# Patient Record
Sex: Female | Born: 1960 | Race: White | Hispanic: No | Marital: Married | Smoking: Former smoker
Health system: Southern US, Community
[De-identification: ages and names within clinical notes are randomized; demographics above are authoritative.]

## PROBLEM LIST (undated history)

## (undated) DIAGNOSIS — M2550 Pain in unspecified joint: Secondary | ICD-10-CM

## (undated) DIAGNOSIS — E349 Endocrine disorder, unspecified: Secondary | ICD-10-CM

## (undated) DIAGNOSIS — R7303 Prediabetes: Secondary | ICD-10-CM

## (undated) DIAGNOSIS — F32A Depression, unspecified: Secondary | ICD-10-CM

## (undated) DIAGNOSIS — IMO0001 Reserved for inherently not codable concepts without codable children: Secondary | ICD-10-CM

## (undated) DIAGNOSIS — M7662 Achilles tendinitis, left leg: Secondary | ICD-10-CM

## (undated) DIAGNOSIS — F419 Anxiety disorder, unspecified: Secondary | ICD-10-CM

## (undated) DIAGNOSIS — M549 Dorsalgia, unspecified: Secondary | ICD-10-CM

## (undated) DIAGNOSIS — E785 Hyperlipidemia, unspecified: Secondary | ICD-10-CM

## (undated) DIAGNOSIS — Z9889 Other specified postprocedural states: Secondary | ICD-10-CM

## (undated) DIAGNOSIS — R600 Localized edema: Secondary | ICD-10-CM

## (undated) DIAGNOSIS — N907 Vulvar cyst: Secondary | ICD-10-CM

## (undated) DIAGNOSIS — K219 Gastro-esophageal reflux disease without esophagitis: Secondary | ICD-10-CM

## (undated) DIAGNOSIS — R9389 Abnormal findings on diagnostic imaging of other specified body structures: Secondary | ICD-10-CM

## (undated) DIAGNOSIS — R002 Palpitations: Secondary | ICD-10-CM

## (undated) DIAGNOSIS — I10 Essential (primary) hypertension: Secondary | ICD-10-CM

## (undated) DIAGNOSIS — R112 Nausea with vomiting, unspecified: Secondary | ICD-10-CM

## (undated) DIAGNOSIS — M1990 Unspecified osteoarthritis, unspecified site: Secondary | ICD-10-CM

## (undated) DIAGNOSIS — J40 Bronchitis, not specified as acute or chronic: Secondary | ICD-10-CM

## (undated) DIAGNOSIS — Z9189 Other specified personal risk factors, not elsewhere classified: Secondary | ICD-10-CM

## (undated) DIAGNOSIS — Z973 Presence of spectacles and contact lenses: Secondary | ICD-10-CM

## (undated) DIAGNOSIS — M25473 Effusion, unspecified ankle: Secondary | ICD-10-CM

## (undated) DIAGNOSIS — F329 Major depressive disorder, single episode, unspecified: Secondary | ICD-10-CM

## (undated) DIAGNOSIS — N926 Irregular menstruation, unspecified: Secondary | ICD-10-CM

## (undated) DIAGNOSIS — D219 Benign neoplasm of connective and other soft tissue, unspecified: Secondary | ICD-10-CM

## (undated) DIAGNOSIS — M722 Plantar fascial fibromatosis: Secondary | ICD-10-CM

## (undated) DIAGNOSIS — N951 Menopausal and female climacteric states: Secondary | ICD-10-CM

## (undated) DIAGNOSIS — M654 Radial styloid tenosynovitis [de Quervain]: Secondary | ICD-10-CM

## (undated) DIAGNOSIS — M25562 Pain in left knee: Secondary | ICD-10-CM

## (undated) DIAGNOSIS — E119 Type 2 diabetes mellitus without complications: Secondary | ICD-10-CM

## (undated) DIAGNOSIS — E669 Obesity, unspecified: Secondary | ICD-10-CM

## (undated) HISTORY — DX: Anxiety disorder, unspecified: F41.9

## (undated) HISTORY — DX: Menopausal and female climacteric states: N95.1

## (undated) HISTORY — DX: Plantar fascial fibromatosis: M72.2

## (undated) HISTORY — PX: SKIN CANCER EXCISION: SHX779

## (undated) HISTORY — DX: Major depressive disorder, single episode, unspecified: F32.9

## (undated) HISTORY — DX: Pain in unspecified joint: M25.50

## (undated) HISTORY — DX: Palpitations: R00.2

## (undated) HISTORY — PX: DILATION AND CURETTAGE OF UTERUS: SHX78

## (undated) HISTORY — PX: HYSTEROSCOPY: SHX211

## (undated) HISTORY — DX: Endocrine disorder, unspecified: E34.9

## (undated) HISTORY — DX: Hyperlipidemia, unspecified: E78.5

## (undated) HISTORY — PX: VIDEO ASSISTED THORACOSCOPY (VATS)/THOROCOTOMY: SHX6173

## (undated) HISTORY — DX: Achilles tendinitis, left leg: M76.62

## (undated) HISTORY — PX: LASER ABLATION OF THE CERVIX: SHX1949

## (undated) HISTORY — DX: Localized edema: R60.0

## (undated) HISTORY — DX: Obesity, unspecified: E66.9

## (undated) HISTORY — DX: Dorsalgia, unspecified: M54.9

## (undated) HISTORY — DX: Essential (primary) hypertension: I10

## (undated) HISTORY — DX: Depression, unspecified: F32.A

## (undated) HISTORY — DX: Pain in left knee: M25.562

## (undated) HISTORY — DX: Benign neoplasm of connective and other soft tissue, unspecified: D21.9

---

## 1990-12-31 HISTORY — PX: LASER ABLATION OF THE CERVIX: SHX1949

## 1998-08-22 ENCOUNTER — Ambulatory Visit (HOSPITAL_COMMUNITY): Admission: RE | Admit: 1998-08-22 | Discharge: 1998-08-22 | Payer: Self-pay | Admitting: Obstetrics and Gynecology

## 1998-10-08 ENCOUNTER — Emergency Department (HOSPITAL_COMMUNITY): Admission: EM | Admit: 1998-10-08 | Discharge: 1998-10-08 | Payer: Self-pay | Admitting: Emergency Medicine

## 1999-04-10 ENCOUNTER — Other Ambulatory Visit: Admission: RE | Admit: 1999-04-10 | Discharge: 1999-04-10 | Payer: Self-pay | Admitting: Obstetrics & Gynecology

## 2000-04-24 ENCOUNTER — Other Ambulatory Visit: Admission: RE | Admit: 2000-04-24 | Discharge: 2000-04-24 | Payer: Self-pay | Admitting: Obstetrics and Gynecology

## 2001-04-25 ENCOUNTER — Other Ambulatory Visit: Admission: RE | Admit: 2001-04-25 | Discharge: 2001-04-25 | Payer: Self-pay | Admitting: *Deleted

## 2002-04-27 ENCOUNTER — Other Ambulatory Visit: Admission: RE | Admit: 2002-04-27 | Discharge: 2002-04-27 | Payer: Self-pay | Admitting: Obstetrics and Gynecology

## 2003-04-30 ENCOUNTER — Other Ambulatory Visit: Admission: RE | Admit: 2003-04-30 | Discharge: 2003-04-30 | Payer: Self-pay | Admitting: Obstetrics and Gynecology

## 2003-10-21 ENCOUNTER — Encounter: Payer: Self-pay | Admitting: Obstetrics and Gynecology

## 2003-10-21 ENCOUNTER — Encounter: Admission: RE | Admit: 2003-10-21 | Discharge: 2003-10-21 | Payer: Self-pay | Admitting: Obstetrics and Gynecology

## 2004-06-02 ENCOUNTER — Other Ambulatory Visit: Admission: RE | Admit: 2004-06-02 | Discharge: 2004-06-02 | Payer: Self-pay | Admitting: Obstetrics and Gynecology

## 2004-11-09 ENCOUNTER — Encounter: Admission: RE | Admit: 2004-11-09 | Discharge: 2004-11-09 | Payer: Self-pay | Admitting: Obstetrics and Gynecology

## 2004-11-20 ENCOUNTER — Encounter: Admission: RE | Admit: 2004-11-20 | Discharge: 2004-11-20 | Payer: Self-pay | Admitting: Obstetrics and Gynecology

## 2005-02-05 ENCOUNTER — Ambulatory Visit: Payer: Self-pay | Admitting: Family Medicine

## 2005-10-15 ENCOUNTER — Ambulatory Visit: Payer: Self-pay | Admitting: Family Medicine

## 2005-11-05 ENCOUNTER — Ambulatory Visit: Payer: Self-pay | Admitting: Internal Medicine

## 2005-11-26 ENCOUNTER — Ambulatory Visit: Payer: Self-pay | Admitting: Internal Medicine

## 2006-06-24 ENCOUNTER — Ambulatory Visit: Payer: Self-pay

## 2006-06-24 ENCOUNTER — Ambulatory Visit: Payer: Self-pay | Admitting: Internal Medicine

## 2006-11-04 ENCOUNTER — Encounter: Admission: RE | Admit: 2006-11-04 | Discharge: 2006-11-04 | Payer: Self-pay | Admitting: Obstetrics and Gynecology

## 2006-11-11 ENCOUNTER — Ambulatory Visit: Payer: Self-pay | Admitting: Internal Medicine

## 2007-03-07 ENCOUNTER — Ambulatory Visit: Payer: Self-pay | Admitting: Internal Medicine

## 2007-03-07 DIAGNOSIS — I10 Essential (primary) hypertension: Secondary | ICD-10-CM | POA: Insufficient documentation

## 2007-03-07 DIAGNOSIS — F329 Major depressive disorder, single episode, unspecified: Secondary | ICD-10-CM

## 2007-03-07 DIAGNOSIS — F419 Anxiety disorder, unspecified: Secondary | ICD-10-CM

## 2007-03-07 DIAGNOSIS — J309 Allergic rhinitis, unspecified: Secondary | ICD-10-CM

## 2007-03-07 LAB — CONVERTED CEMR LAB
BUN: 17 mg/dL (ref 6–23)
Basophils Absolute: 0.1 10*3/uL (ref 0.0–0.1)
CO2: 27 meq/L (ref 19–32)
Calcium: 9.1 mg/dL (ref 8.4–10.5)
Chloride: 104 meq/L (ref 96–112)
Cholesterol: 213 mg/dL (ref 0–200)
Creatinine, Ser: 0.5 mg/dL (ref 0.4–1.2)
GFR calc Af Amer: 172 mL/min
Glucose, Bld: 95 mg/dL (ref 70–99)
HCT: 39.9 % (ref 36.0–46.0)
HDL: 44.9 mg/dL (ref >39.0)
Hemoglobin: 13.6 g/dL (ref 12.0–15.0)
Lymphocytes Relative: 28.9 % (ref 12.0–46.0)
Monocytes Absolute: 0.5 10*3/uL (ref 0.2–0.7)
Platelets: 384 10*3/uL (ref 150–400)
Potassium: 3.8 meq/L (ref 3.5–5.1)
RBC: 4.12 M/uL (ref 3.87–5.11)
Sodium: 138 meq/L (ref 135–145)
WBC: 6 10*3/uL (ref 4.5–10.5)

## 2007-03-17 ENCOUNTER — Ambulatory Visit: Payer: Self-pay | Admitting: Internal Medicine

## 2007-11-28 ENCOUNTER — Encounter: Admission: RE | Admit: 2007-11-28 | Discharge: 2007-11-28 | Payer: Self-pay | Admitting: Obstetrics and Gynecology

## 2007-12-12 ENCOUNTER — Ambulatory Visit: Payer: Self-pay | Admitting: Internal Medicine

## 2008-01-30 ENCOUNTER — Ambulatory Visit: Payer: Self-pay | Admitting: *Deleted

## 2008-02-06 ENCOUNTER — Encounter: Payer: Self-pay | Admitting: Internal Medicine

## 2008-02-06 ENCOUNTER — Ambulatory Visit: Payer: Self-pay | Admitting: *Deleted

## 2008-02-13 ENCOUNTER — Ambulatory Visit: Payer: Self-pay | Admitting: *Deleted

## 2008-02-27 ENCOUNTER — Ambulatory Visit: Payer: Self-pay | Admitting: *Deleted

## 2008-03-05 ENCOUNTER — Telehealth (INDEPENDENT_AMBULATORY_CARE_PROVIDER_SITE_OTHER): Payer: Self-pay | Admitting: *Deleted

## 2008-03-12 ENCOUNTER — Ambulatory Visit: Payer: Self-pay | Admitting: *Deleted

## 2008-03-26 ENCOUNTER — Ambulatory Visit: Payer: Self-pay | Admitting: *Deleted

## 2008-04-23 ENCOUNTER — Telehealth (INDEPENDENT_AMBULATORY_CARE_PROVIDER_SITE_OTHER): Payer: Self-pay | Admitting: *Deleted

## 2008-04-23 ENCOUNTER — Ambulatory Visit: Payer: Self-pay | Admitting: *Deleted

## 2008-04-30 ENCOUNTER — Ambulatory Visit: Payer: Self-pay | Admitting: *Deleted

## 2008-06-22 ENCOUNTER — Telehealth (INDEPENDENT_AMBULATORY_CARE_PROVIDER_SITE_OTHER): Payer: Self-pay | Admitting: *Deleted

## 2008-06-22 ENCOUNTER — Ambulatory Visit: Payer: Self-pay | Admitting: Endocrinology

## 2008-06-22 DIAGNOSIS — J45909 Unspecified asthma, uncomplicated: Secondary | ICD-10-CM

## 2008-06-23 ENCOUNTER — Telehealth: Payer: Self-pay | Admitting: Endocrinology

## 2008-06-25 ENCOUNTER — Ambulatory Visit: Payer: Self-pay | Admitting: Cardiology

## 2008-06-28 ENCOUNTER — Encounter: Payer: Self-pay | Admitting: Endocrinology

## 2008-07-01 ENCOUNTER — Encounter: Payer: Self-pay | Admitting: Endocrinology

## 2008-07-01 ENCOUNTER — Ambulatory Visit: Payer: Self-pay | Admitting: Cardiothoracic Surgery

## 2008-07-06 ENCOUNTER — Ambulatory Visit (HOSPITAL_COMMUNITY): Admission: RE | Admit: 2008-07-06 | Discharge: 2008-07-06 | Payer: Self-pay | Admitting: Cardiothoracic Surgery

## 2008-07-08 ENCOUNTER — Ambulatory Visit: Payer: Self-pay | Admitting: Cardiothoracic Surgery

## 2008-07-13 ENCOUNTER — Inpatient Hospital Stay (HOSPITAL_COMMUNITY): Admission: RE | Admit: 2008-07-13 | Discharge: 2008-07-16 | Payer: Self-pay | Admitting: Cardiothoracic Surgery

## 2008-07-13 ENCOUNTER — Encounter: Payer: Self-pay | Admitting: Cardiothoracic Surgery

## 2008-07-15 ENCOUNTER — Ambulatory Visit: Payer: Self-pay | Admitting: Cardiothoracic Surgery

## 2008-07-23 ENCOUNTER — Ambulatory Visit: Payer: Self-pay | Admitting: Cardiothoracic Surgery

## 2008-07-30 ENCOUNTER — Encounter (INDEPENDENT_AMBULATORY_CARE_PROVIDER_SITE_OTHER): Payer: Self-pay | Admitting: *Deleted

## 2008-08-09 ENCOUNTER — Encounter: Admission: RE | Admit: 2008-08-09 | Discharge: 2008-08-09 | Payer: Self-pay | Admitting: Cardiothoracic Surgery

## 2008-08-12 ENCOUNTER — Ambulatory Visit: Payer: Self-pay | Admitting: Cardiothoracic Surgery

## 2008-11-11 ENCOUNTER — Ambulatory Visit: Payer: Self-pay | Admitting: Cardiothoracic Surgery

## 2008-11-11 ENCOUNTER — Encounter: Admission: RE | Admit: 2008-11-11 | Discharge: 2008-11-11 | Payer: Self-pay | Admitting: Cardiothoracic Surgery

## 2008-12-13 ENCOUNTER — Encounter: Admission: RE | Admit: 2008-12-13 | Discharge: 2008-12-13 | Payer: Self-pay | Admitting: Obstetrics and Gynecology

## 2008-12-21 ENCOUNTER — Ambulatory Visit: Payer: Self-pay | Admitting: Internal Medicine

## 2008-12-27 LAB — CONVERTED CEMR LAB
BUN: 19 mg/dL (ref 6–23)
Calcium: 9.3 mg/dL (ref 8.4–10.5)
Creatinine, Ser: 0.6 mg/dL (ref 0.4–1.2)
GFR calc Af Amer: 139 mL/min
GFR calc non Af Amer: 115 mL/min
Sodium: 140 meq/L (ref 135–145)

## 2009-05-12 ENCOUNTER — Ambulatory Visit: Payer: Self-pay | Admitting: Cardiothoracic Surgery

## 2009-05-12 ENCOUNTER — Encounter: Admission: RE | Admit: 2009-05-12 | Discharge: 2009-05-12 | Payer: Self-pay | Admitting: Cardiothoracic Surgery

## 2009-05-12 ENCOUNTER — Encounter: Payer: Self-pay | Admitting: Internal Medicine

## 2009-05-31 ENCOUNTER — Telehealth (INDEPENDENT_AMBULATORY_CARE_PROVIDER_SITE_OTHER): Payer: Self-pay | Admitting: *Deleted

## 2009-06-22 ENCOUNTER — Telehealth (INDEPENDENT_AMBULATORY_CARE_PROVIDER_SITE_OTHER): Payer: Self-pay | Admitting: *Deleted

## 2009-08-29 ENCOUNTER — Ambulatory Visit: Payer: Self-pay | Admitting: Internal Medicine

## 2009-08-29 DIAGNOSIS — Z6841 Body Mass Index (BMI) 40.0 and over, adult: Secondary | ICD-10-CM | POA: Insufficient documentation

## 2009-08-31 LAB — CONVERTED CEMR LAB
Basophils Absolute: 0 10*3/uL (ref 0.0–0.1)
Eosinophils Absolute: 0.2 10*3/uL (ref 0.0–0.7)
GFR calc non Af Amer: 140.76 mL/min (ref >60)
HCT: 38.5 % (ref 36.0–46.0)
Hemoglobin: 13.3 g/dL (ref 12.0–15.0)
Lymphocytes Relative: 23.7 % (ref 12.0–46.0)
Lymphs Abs: 1.6 10*3/uL (ref 0.7–4.0)
MCV: 95.9 fL (ref 78.0–100.0)
Monocytes Absolute: 0.7 10*3/uL (ref 0.1–1.0)
Monocytes Relative: 10.9 % (ref 3.0–12.0)
Neutro Abs: 4.2 10*3/uL (ref 1.4–7.7)
Neutrophils Relative %: 62.1 % (ref 43.0–77.0)
Platelets: 305 10*3/uL (ref 150.0–400.0)
Potassium: 3.9 meq/L (ref 3.5–5.1)
RBC: 4.02 M/uL (ref 3.87–5.11)
RDW: 12.2 % (ref 11.5–14.6)

## 2009-09-06 ENCOUNTER — Telehealth (INDEPENDENT_AMBULATORY_CARE_PROVIDER_SITE_OTHER): Payer: Self-pay | Admitting: *Deleted

## 2010-01-02 ENCOUNTER — Encounter: Admission: RE | Admit: 2010-01-02 | Discharge: 2010-01-02 | Payer: Self-pay | Admitting: Obstetrics and Gynecology

## 2011-01-20 ENCOUNTER — Encounter: Payer: Self-pay | Admitting: Obstetrics and Gynecology

## 2011-01-22 ENCOUNTER — Encounter: Payer: Self-pay | Admitting: Cardiothoracic Surgery

## 2011-01-30 NOTE — Consult Note (Signed)
Summary: Behavioral Medicine  Behavioral Medicine   Imported By: Freddy Jaksch 02/11/2008 10:45:28  _____________________________________________________________________  External Attachment:    Type:   Image     Comment:   External Document

## 2011-01-31 ENCOUNTER — Encounter: Payer: Self-pay | Admitting: Internal Medicine

## 2011-01-31 ENCOUNTER — Ambulatory Visit (INDEPENDENT_AMBULATORY_CARE_PROVIDER_SITE_OTHER): Payer: BC Managed Care – PPO | Admitting: Internal Medicine

## 2011-01-31 DIAGNOSIS — E669 Obesity, unspecified: Secondary | ICD-10-CM

## 2011-01-31 DIAGNOSIS — R61 Generalized hyperhidrosis: Secondary | ICD-10-CM | POA: Insufficient documentation

## 2011-01-31 DIAGNOSIS — F411 Generalized anxiety disorder: Secondary | ICD-10-CM

## 2011-01-31 DIAGNOSIS — I10 Essential (primary) hypertension: Secondary | ICD-10-CM

## 2011-02-05 ENCOUNTER — Encounter: Payer: Self-pay | Admitting: Internal Medicine

## 2011-02-05 ENCOUNTER — Ambulatory Visit (INDEPENDENT_AMBULATORY_CARE_PROVIDER_SITE_OTHER)
Admission: RE | Admit: 2011-02-05 | Discharge: 2011-02-05 | Disposition: A | Discharge status: Home / Self Care | Payer: BC Managed Care – PPO | Source: Ambulatory Visit | Attending: Internal Medicine | Admitting: Internal Medicine

## 2011-02-05 ENCOUNTER — Other Ambulatory Visit: Payer: Self-pay | Admitting: Internal Medicine

## 2011-02-05 DIAGNOSIS — R61 Generalized hyperhidrosis: Secondary | ICD-10-CM

## 2011-02-07 NOTE — Assessment & Plan Note (Signed)
Summary: f/u   Vital Signs:  Patient profile:   50 year old female Height:      64 inches Weight:      293.38 pounds BMI:     50.54 Pulse rate:   74 / minute Pulse rhythm:   regular BP sitting:   132 / 88  (left arm) Cuff size:   large  Vitals Entered By: Army Fossa CMA (January 31, 2011 9:37 AM) CC: Follow up visit from Meds. fasting Comments Having night sweats  feels run down [facility]   History of Present Illness: routine office visit, has several issues ---Her main concern is her weight, she has gained several pounds ---Also having sweats at night, no actual fevers; the patient wonders if symptoms are related to menopause . States she has regular periods. ---Anxiety and depression well controlled, feels upbeat and well. the last time she was here, we talked about decrease her sertraline to 75 mg daily but she did not.    ROS  As far as her diet, she admits to poor habits although she has cut down on  salt intake and sodas lately. she had feet  pain, so her podiatrist, symptoms better Admits to occasional cough with clear sputum. No hemoptysis Occasional shortness of breath as well, she thinks related to her asthma. No chest pain,  no lower extremity edema  Current Medications (verified): 1)  Zoloft 50 Mg  Tabs (Sertraline Hcl) .... 2  By Mouth Once Daily 2)  Hydrochlorothiazide 25 Mg  Tabs (Hydrochlorothiazide) .Marland Kitchen.. 1 By Mouth Qd 3)  Diovan 160 Mg  Tabs (Valsartan) .Marland Kitchen.. 1 By Mouth Once Daily. Must Make An Appointment Before Additional Refills. 4)  Flonase 50 Mcg/act Susp (Fluticasone Propionate) .... 2 Sprays On Each Side of The Nose  Once Daily 5)  Ventolin Hfa 108 (90 Base) Mcg/act Aers (Albuterol Sulfate) .... 2 Puffs Qid As Needed Wheezing  Allergies (verified): 1)  ! Sulfa  Past History:  Past Medical History: Reviewed history from 12/21/2008 and no changes required. 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 Cervical surgery (gyn)  Social History: works at the OR (Advertising account executive, [employer]) Married no kids tobacco--no ETOH-- rarely  Physical Exam  General:  alert, well-developed, and overweight-appearing.   Lungs:  normal respiratory effort, no intercostal retractions, no accessory muscle use, and normal breath sounds.   Heart:  normal rate, regular rhythm, and no murmur.   Extremities:  no LE edema Neurologic:  alert & oriented X3, strength normal in all extremities, and gait normal.   Psych:  Oriented X3, memory intact for recent and remote, normally interactive, good eye contact, not anxious appearing, and not depressed appearing.     Impression & Recommendations:  Problem # 1:  EXOGENOUS OBESITY (ICD-278.00) at this point, her weight is her main medical problem Recommend daily walks and died. She joined [employer] last year, apparently not working for her. We discussed a nutritionist referral versus [employer].  Orders: TLB-TSH (Thyroid Stimulating Hormone) (84443-TSH)  Problem # 2:  HYPERTENSION (ICD-401.9) no change for now Her updated medication list for this problem includes:    Hydrochlorothiazide 25 Mg Tabs (Hydrochlorothiazide) .Marland Kitchen... 1 by mouth qd    Diovan 160 Mg Tabs (Valsartan) .Marland Kitchen... 1 by mouth once daily.  BP today: 132/88 Prior  BP: 128/88 (08/29/2009)  Labs Reviewed: K+: 3.9 (08/29/2009) Creat: : 0.5 (08/29/2009)   Chol: 213 (03/07/2007)   HDL: 44.9 (03/07/2007)   LDL: DEL (03/07/2007)   TG: 102 (03/07/2007)  Problem # 3:  ANXIETY (ICD-300.00) see previous entries, since her last visit she decided not to decrease Zoloft to 75 mg but today she states she is ready. see instructions Her updated medication list for this problem includes:     Zoloft 50 Mg Tabs (Sertraline hcl) .Marland Kitchen... 1.5 tablets daily  Problem # 4:  NIGHT SWEATS (ICD-780.8)  reports night sweats, no fever, no weight loss. 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 1.5 tablets daily 2)  Hydrochlorothiazide 25 Mg Tabs (Hydrochlorothiazide) .Marland Kitchen.. 1 by mouth qd 3)  Diovan 160 Mg Tabs (Valsartan) .Marland Kitchen.. 1 by mouth once daily. 4)  Flonase 50 Mcg/act Susp (Fluticasone propionate) .... 2 sprays on each side of the nose  once daily 5)  Ventolin Hfa 108 (90 Base) Mcg/act Aers (Albuterol sulfate) .... 2 puffs qid as needed wheezing  Patient Instructions: 1)  decrease sertraline to 75 mg daily (1.5 tablets daily) 2)  walk daily for at least 30 minutes 3)  Please schedule a follow-up appointment in 3 months .    Orders Added: 1)  Venipuncture [36415] 2)  TLB-BMP (Basic Metabolic Panel-BMET) [80048-METABOL] 3)  TLB-CBC Platelet - w/Differential [85025-CBCD] 4)  TLB-Lipid Panel [80061-LIPID] 5)  T-2 View CXR [71020TC] 6)  TLB-TSH (Thyroid Stimulating Hormone) [84443-TSH] 7)  TLB-Hepatic/Liver Function Pnl [80076-HEPATIC] 8)  Est. Patient Level IV [98119]

## 2011-05-15 NOTE — Discharge Summary (Signed)
Rachel Norris, Rachel Norris           ACCOUNT NO.:  [id number]   MEDICAL RECORD NO.:  [id number]          PATIENT TYPE:  INP   LOCATION:  3301                         FACILITY:  [facility]   PHYSICIAN:  Sheliah Plane, MD    DATE OF BIRTH:  Dec 12, 1961   DATE OF ADMISSION:  07/13/2008  DATE OF DISCHARGE:  07/16/2008                               DISCHARGE SUMMARY   FINAL DIAGNOSES:  1. Right upper lobe mass.  2. necrotizing granuloma.  No malignancy identified.   SECONDARY DIAGNOSES:  1. History of asthma.  2. History of arthritis, especially involving the feet.  3. Hypertension.  4. Untreated hyperlipidemia.  5. Smoker.   IN-HOSPITAL OPERATIONS AND PROCEDURES:  Right video-assisted  thoracoscopic surgery with wedge resection of right upper lobe mass.   THE PATIENT'S HISTORY AND PHYSICAL AND HOSPITAL COURSE:  The patient is  a 50 year old female with no previous history of chest x-rays who  presents with a 2-week hacking cough, which increased with wheezing and  nasal drainage.  No fever, chills, or hemoptysis noted.  The patient  presented to Dr. George Hugh office who examined her and started her on  Cefotan and also obtained chest x-ray, which showed slightly lobulated  22 x 17-mm right lung mass.  CT scan was recommended and performed on  June 27, 2008.  Because of the right lung mass, the patient is referred  to thoracic surgery for consultation.  The patient was a remote smoker,  having quit 11 years ago.  Previously, she had 16-18-year history of  smoking, half to one pack cigarettes a day.  She has a history of asthma  for many years but no specific treatment.  She was seen and evaluated by  Dr. Tyrone Sage.  Dr. Tyrone Sage discussed with the patient regarding wedge  resection of this mass.  He discussed risks and benefits with the  patient.  The patient also understanding and agreed to proceed.  Surgery  was scheduled for July 13, 2008.  For details of the patient's past  medical  history and physical exam, please see dictated H&P.   The patient was taken to the operating room on July 13, 2008, where she  underwent right video-assisted thoracoscopic surgery with right  thoracotomy, wedge resection of right upper lobe mass.  Pathology report  came back showing necrotizing granulomas with no malignancy identified.  The patient tolerated this procedure well and was transferred to the  intensive care unit in stable condition.  Postoperatively, the patient  was able to be extubated without difficulty.  Post extubation, she was  noted to be alert and oriented x4.  Neuro intact.  Vital signs  postoperatively were stable.  She was afebrile.  Her rate and blood  pressure is stable.  She was restarted on her Diovan as well as HCTZ.  She remained in normal sinus rhythm.  She was able to be weaned off  oxygen with sats greater than 90% on room air.  Daily chest x-rays  obtained postoperatively.  These remained stable.  No pneumothorax  noted.  The patient had no air leak noted from chest tube.  Chest tube  was placed to water seal by postop day #2 and was able to be  discontinued postop day #3 based on chest x-ray remaining stable and no  air leak.  A followup chest x-ray is currently pending.  The remainder  of the patient's postoperative course was unremarkable.  She remained  hemodynamically stable.  She was out of bed and ambulating well without  difficulty.  She was tolerating diet well.  No nausea or vomiting noted.  All incisions are clean, dry, and intact and healing well.   The patient is tentatively ready for discharge home today, July 16, 2008, on postop day #3, pending PA and lateral chest x-ray stable  following removal of chest tube.   Labs done on July 15, 2008, showed a sodium of 136, potassium 3.9,  chloride of 98, bicarb of 29, BUN 12, creatinine 0.6, and glucose 124.  White blood cell count was 10.5, hemoglobin of 11.7, hematocrit 33.8,  and platelet  count 334.  The AST of 23, ALT 22, alk phos was 54 with a  total bilirubin of 0.7, and total protein 6.3.   FOLLOW UP APPOINTMENTS:  Followup appointment has arranged with Dr.  Tyrone Sage for August 12, 2008, at 1:00 p.m..  The patient will need to  obtain PA and lateral chest x-ray 30 minutes prior to this appointment.  An appointment with a nurse has been arranged for suture removal for  July 23, 2008, at 10:30 a.m.Marland Kitchen   ACTIVITY:  The patient is instructed no driving until released to do so  and no lifting over 10 pounds.  She is told to ambulate 3-4 times per  day.  Progress as tolerated and continue using her breathing exercises.   INCISIONAL CARE:  The patient is told to shower and washing her  incisions using soap and water.  She is to contact the office if she  develops any drainage or opening from any part of her incision sites.   DIET:  The patient is to begin on diet to be low-fat, low-salt.   DISCHARGE MEDICATIONS:  1. Diovan 160 mg daily.  2. Zoloft 100 mg daily.  3. HCTZ 25 mg daily.  4. Advair p.r.n.  5. Pepcid p.r.n.  6. Percocet 5/325 one to two tablets q. 4-6 hours p.r.n.      Theda Belfast, [place]      Sheliah Plane, MD  Electronically Signed    KMD/MEDQ  D:  07/16/2008  T:  07/17/2008  Job:  [id number]   cc:   Gregary Signs A. Everardo All, MD  Eliberto Ivory Rosalio Macadamia, M.D.

## 2011-05-15 NOTE — Assessment & Plan Note (Signed)
OFFICE VISIT   Rachel, Norris  DOB:  1961/09/16                                        May 12, 2009  CHART #:  [id number]   The patient returns to the office today for followup chest x-ray.  In  July 2009, she underwent right mini thoracotomy and wedge resection of  right lung lesion which was found to be a 2.5 x 1.5 cm mass in the right  upper lobe.  On final PET, there was a necrotizing granuloma.  She has  done well postoperatively and denies any fever or chills.  She does  occasionally have some wheezing.  No hemoptysis.  She has been a  nonsmoker for the past 13 years.   PHYSICAL EXAMINATION:  VITAL SIGNS:  Her blood pressure 138/86, pulse  62, respiratory rate is 18, and O2 sats 96%.  LUNGS:  Clear bilaterally.  CHEST:  The right chest incision is well healed.  EXTREMITIES:  She has no leg tenderness or swelling.   Followup chest x-ray shows no evidence of development of nodules.  There  is postoperative changes present, otherwise clear lung fields.   IMPRESSION:  The patient is stable following right thoracotomy with no  evidence of new lesions now 8 months postop.  At this point with benign  diagnosis and no changes in chest x-ray, we will let Dr. Drue Novel continue to  follow her.  I have not made her a return appointment to see me, but  would be glad to see her at his request at anytime.   Sheliah Plane, MD  Electronically Signed   EG/MEDQ  D:  05/12/2009  T:  05/13/2009  Job:  [id number]   cc:   Willow Ora, MD

## 2011-05-15 NOTE — Assessment & Plan Note (Signed)
OFFICE VISIT   Rachel Norris, Rachel Norris  DOB:  08-26-1961                                        August 12, 2008  CHART #:  [id number]   The patient returns to the office today.  She was originally seen as a  consult on July 01, 2008, with a slightly lobulated 22 x 17-mm right lung  mass found on CT scan without hilar adenopathy.  The patient did have a  previous history of smoking.  Subsequently, pulmonary function tests  were performed and PET scan was done.  The lesion on PET scan had low-  level activity at approximately 1.4 g/mL, suggesting that it was either  benign or an endoneoplasm such as bronchioalveolar carcinoma.  After  discussing this with the patient, it was still decided to proceed with  right video-assisted thoracoscopy and wedge resection of the lesion for  both the pathologic diagnosis and removal of the lesion rather than  proceeding at age 40 with numerous sequential CT scans.  The patient was  agreeable with this approach, and we will tentatively plan surgery on  July 14.   Sheliah Plane, MD  Electronically Signed   EG/MEDQ  D:  08/12/2008  T:  08/13/2008  Job:  [id number]   cc:   Gregary Signs A. Everardo All, MD

## 2011-05-15 NOTE — Op Note (Signed)
Rachel Norris, Rachel Norris                 ACCOUNT NO.:  [id number]   MEDICAL RECORD NO.:  [id number]          PATIENT TYPE:  INP   LOCATION:                                FACILITY:  [facility]   PHYSICIAN:  Sheliah Plane, MD    DATE OF BIRTH:  08-Aug-1961   DATE OF PROCEDURE:  07/13/2008  DATE OF DISCHARGE:  07/16/2008                               OPERATIVE REPORT   PREOPERATIVE DIAGNOSIS:  Right lung lesion.   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   PROCEDURE PERFORMED:  1. Bronchoscopy.  2. Right mini thoracotomy with right video-assisted thoracoscopy.  3. Right mini thoracotomy with wedge resection of right upper lobe      lung lesion.   SURGEON:  Sheliah Plane, MD   FIRST ASSISTANT:  Stephanie Acre. Dominick, PA   BRIEF HISTORY:  The patient is a 50 year old female with a previous  smoking history who presents with incidentally-obtained chest x-ray with  a 2.5- x 1.5-cm mass in the right upper lobe suspicious for carcinoma.  Preoperatively, the patient obtained pulmonary function studies, MRI of  the head to rule out metastatic disease, and a PET scan.  On the PET  scan, the SUV was not elevated.  There was no elevated activity in the  mediastinal lymph nodes.  Because of the size of the lesion, a surgical  resection was recommended.  The patient agreed and signed informed  consent.   DESCRIPTION OF THE PROCEDURE:  The patient underwent general  endotracheal anesthesia without incident through the double-lumen  endotracheal tube.  A fiberoptic bronchoscope was passed to the  subsegmental level bilaterally with no endobronchial lesions and good  position of the double-lumen endotracheal tube.  The patient was then  turned to the lateral decubitus position after confirming side and  identity.  Right chest was prepped with Betadine and draped in the usual  sterile manner.  Because of the patient's large size, positioning was  somewhat  difficult and also a single thoracoscopy port was placed in the  midaxillary line.  Even this was difficult because of the depth of the  patient's chest wall.  The scope was used to explore the chest; however,  the exact location of the lesion which was not on the pleural surface  was difficult to locate.  A small thoracotomy incision was made and the  lesion was palpated in the right upper lobe.  A Echelon stapler was then  used to perform a wedge resection of the lesion.  During the process of  doing this, yellow material of toothpaste consistency leaked from the  area.  Cultures of this were obtained including fungal and AFB.  Frozen  section revealed no evidence of malignancy in the wall of what appeared  to be a cystic structure.  The scopes were removed.  The cut edge of the  lung was tested for leaks.  A single chest tube was left in place.  Two  intercostal sutures were placed.  The chest wall chest was closed in  layers of interrupted 0 Vicryl, 3-0  Vicryl for subcutaneous tissue, and  a 3-0 subcuticular stitch.  Dry dressings were  applied.  Sponge and needle count was reported as correct at the  completion of the procedure, and the patient tolerated the procedure  without obvious complication and was transferred to Surgical Intensive  Care Unit for further postoperative care.      Sheliah Plane, MD  Electronically Signed     EG/MEDQ  D:  07/17/2008  T:  07/17/2008  Job:  [id number]   cc:   Gregary Signs A. Everardo All, MD

## 2011-05-15 NOTE — Assessment & Plan Note (Signed)
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  The patient is making a  good recovery.  She has started at a new job and really has no  limitations in her ability now.  Occasionally, very mild discomfort in  the right chest when she sneezes, but otherwise, no complaints.  Unfortunately on exam, her blood pressure remains elevated, today it is  133/90.  On previous office visits, her diastolic blood pressures have  been 97, 97, and 92.  Her lungs are clear bilaterally.  Her incision on  the right chest is completely healed without evidence of infection.  Lower extremities are without tenderness or edema.   Followup chest x-ray shows clear lung fields.  No new lesions.   PLAN:  I will plan to see her back in 6 months with a followup chest x-  ray and if there are no additional changes, we will discharge her from  regular visits.  I have encouraged her to work on losing weight and also  to return to see Dr. Drue Novel at [facility] for follow up on her blood pressure  control.   Rachel Plane, MD  Electronically Signed   EG/MEDQ  D:  11/11/2008  T:  11/11/2008  Job:  [id number]   cc:   Rachel Ora, MD

## 2011-05-15 NOTE — Assessment & Plan Note (Signed)
OFFICE VISIT   Rachel Norris, Rachel Norris  DOB:  [birth year]                                        August 12, 2008  CHART #:  [id number]   The patient returns to the office today in followup after the mini right  thoracotomy with wedge resection of right upper lobe lung lesion done on  July 14.  Since discharge, the patient has done well.  She has had some  right chest wall pain, but it is improved.  Chest x-ray was done 2 days  ago.  She is increased in her physical activity and is anxious to return  to a new job in several weeks.   On exam today, her blood pressure is elevated at 150/92, pulse is 78,  respiratory rate is 18, and O2 sats 94%.  Her right chest incision is  well healed.  The lungs are clear bilaterally.  She has no pedal edema.  Overall, looks well.   Followup chest x-ray shows postoperative changes, but without  pneumothorax or significant pleural effusions.  I reviewed the final  path with the patient, which shows necrotizing granuloma.  No  malignancies were identified.  Special stains for organisms were  performed.  No evidence of acid-fast bacilli were identified, and no  fungi were identified.  Overall, I am pleased with the patient's  progress.  I do plan to see her back in 3 months with a followup chest x-  ray.  The patient is followed by Dr. Romero Belling, and I will send a  copy of this note to his office with special note that the patient does  have significant hypertension on each of her visits to the office  147/97, 156/97, and 150/92.   Sheliah Plane, MD  Electronically Signed   EG/MEDQ  D:  08/12/2008  T:  08/13/2008  Job:  [id number]   cc:   Gregary Signs A. Everardo All, MD

## 2011-05-15 NOTE — Consult Note (Signed)
NEW PATIENT CONSULTATION   EMORY-HEATH, Rachel Norris  DOB:  1961-08-01                                        July 01, 2008  CHART #:  [id number]   REQUESTING PHYSICIAN:  Dr. Gregary Signs A. Everardo All, MD   FOLLOWUP PRIMARY CARE PHYSICIAN:  Dr. Cleophas Dunker. Everardo All, MD   GYNECOLOGIST:  Eliberto Ivory. Rosalio Macadamia, M.D   REASON FOR CONSULTATION:  Right lung mass.   HISTORY OF PRESENT ILLNESS:  The patient is a 50 year old female with no  previous history of chest x-rays who presents with a 2-week dry hacking  cough, which increased with wheezing and nasal drainage.  No fever,  chills, no hemoptysis, some thick yellow sputum and some right ear pain.  She presented to Dr. Everardo All who is examined her, started her on  Cefotan, and also obtained a chest x-ray, which showed a slightly  lobulated 22 x 17 mm right lung mass.  CT scan was recommended and  performed on June 27, 2008.  Because of the right lung mass, the patient  is referred to Thoracic Surgery for consultation.   The patient is a remote smoker, having quit 11 years ago, previous to  that she had a 16 to 18-year history of smoking a half-to-one pack of  cigarettes a day.  She has a history of asthma for many years, but is on  no specific treatment.   PAST MEDICAL HISTORY:  1. History of asthma.  2. History of arthritis especially involving the feet.  She has known      hypertension, untreated hyperlipidemia, no diabetes, remote smoker.   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  No history of lung cancer in her family.   CURRENT MEDICATIONS:  1. Diovan 160 mg a day.  2. Zoloft 100 mg a day.  3. Hydrochlorothiazide 25 mg a day, recently started on Advair, though      she has not taken it much and notes that it does not seem to help.   ALLERGIES:  SULFA, which causes whelps.   REVIEW OF SYSTEMS:  The patient notes significant weight gain over the  past several years up to 100  pounds.  Denies amaurosis or TIAs.  Denies  change in hearing.  She denies palpitations, syncope, or chest pain.  She does have progressive dyspnea on exertion as she has increased her  weight.  She denies any GI symptoms.  No hematochezia or melena.  Has  had no history of gallbladder or cholecystitis.  Denies hematuria or  dysuria.  Last menstrual period was 1 week ago, notes that she has  regular periods, uses condoms for birth control.  Denies history of  stroke, seizures, notes a history of depression.   PHYSICAL EXAMINATION:  VITAL SIGNS:  Blood pressure is 147/97, pulse is  72, respiratory rate 18, O2 sats 91% on room air, 5 feet 4 inches tall,  and 270 pounds.  GENERAL:  The patient is awake and alert, neurologically intact, and  able to relate her history in good detail.  She has no carotid bruits.  LUNGS:  Clear bilaterally to auscultation.  CARDIAC:  Regular rate and rhythm without murmur or gallop.  ABDOMEN:  Obesity without palpable organomegaly or tenderness.  She has  mild pedal edema bilaterally.   IMAGING  STUDIES:  CT scan is reviewed, shows a 1.5 x 2.1 x 1.6 cm  nodule, slightly irregular in the right inferior part of the right upper  lobe.  There are vague ground-glass areas approximately 4-mm in size  noted in the right middle lobe.  There is a right paratracheal lymph  node approximately 1 x 1.5 cm.  No hilar adenopathy.  Tiny focus of  hyperenhancement on lateral aspect of the left lobe of the liver that is  too small to characterize.   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  Discussed the probable diagnosis with the patient  and her sister, and I have suggested that we proceed with aggressive  diagnosis and treatment.  We will obtain a full set of pulmonary  function studies with diffusion capacity PET scan and MRI of the brain  with  anticipation of proceeding with probable right  VATS and lung resection depending on the results of the PET scan, obtain  the studies in the next several days and see her back next week.   Sheliah Plane, MD  Electronically Signed   EG/MEDQ  D:  07/01/2008  T:  07/01/2008  Job:  [id number]   cc:   Gregary Signs A. Everardo All, MD  Eliberto Ivory Rosalio Macadamia, M.D.

## 2011-05-21 ENCOUNTER — Other Ambulatory Visit: Payer: Self-pay | Admitting: Internal Medicine

## 2011-07-09 ENCOUNTER — Other Ambulatory Visit: Payer: Self-pay | Admitting: Internal Medicine

## 2011-08-24 ENCOUNTER — Encounter: Payer: Self-pay | Admitting: Family Medicine

## 2011-08-24 ENCOUNTER — Ambulatory Visit (INDEPENDENT_AMBULATORY_CARE_PROVIDER_SITE_OTHER): Payer: BC Managed Care – PPO | Admitting: Family Medicine

## 2011-08-24 VITALS — BP 118/78 | HR 84 | Temp 98.8°F | Wt 296.0 lb

## 2011-08-24 DIAGNOSIS — J069 Acute upper respiratory infection, unspecified: Secondary | ICD-10-CM

## 2011-08-24 MED ORDER — BENZONATATE 200 MG PO CAPS: 200.0000 mg | ORAL_CAPSULE | Freq: Three times a day (TID) | ORAL | Status: AC | PRN

## 2011-08-24 MED ORDER — AZITHROMYCIN 250 MG PO TABS: 250.0000 mg | ORAL_TABLET | Freq: Every day | ORAL | Status: AC

## 2011-08-24 NOTE — Patient Instructions (Signed)
Take the Azithromycin as directed Use the tessalon as needed for cough Drink plenty of fluids Take mucinex to thin your chest congestion Call with any questions or concerns Hang in there!

## 2011-08-24 NOTE — Assessment & Plan Note (Signed)
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  Reviewed supportive care and red flags that should prompt return.  Pt expressed understanding and is in agreement w/ plan.

## 2011-08-24 NOTE — Progress Notes (Signed)
  Subjective:    Patient ID: Rachel Norris, female    DOB: [birth year], 50 y.o.   MRN: [id number]  HPI Cough- sxs started 6 days ago, will have fits of coughing, 'i'm almost choking'.  Rarely productive.  No fevers.  Bilateral ear fullness.  + nasal congestion.  Denies facial pain/pressure.  + sick contacts.   Review of Systems For ROS see HPI     Objective:   Physical Exam  Vitals reviewed. Constitutional: She appears well-developed and well-nourished. No distress.  HENT:  Head: Normocephalic and atraumatic.       TMs normal bilaterally Mild nasal congestion Throat w/out erythema, edema, or exudate  Eyes: Conjunctivae and EOM are normal. Pupils are equal, round, and reactive to light.  Neck: Normal range of motion. Neck supple.  Cardiovascular: Normal rate, regular rhythm, normal heart sounds and intact distal pulses.   No murmur heard. Pulmonary/Chest: Effort normal and breath sounds normal. No respiratory distress. She has no wheezes.       + hacking cough  Lymphadenopathy:    She has no cervical adenopathy.          Assessment & Plan:

## 2011-09-15 ENCOUNTER — Other Ambulatory Visit: Payer: Self-pay | Admitting: Internal Medicine

## 2011-09-17 NOTE — Telephone Encounter (Signed)
Rx request for diovan 160 and zoloft 50 mg; pt last seen  01/31/11.  Pls advise.

## 2011-09-27 LAB — CREATININE, SERUM
Creatinine, Ser: 0.59
GFR calc Af Amer: >60
GFR calc non Af Amer: >60

## 2011-09-27 LAB — CBC
HCT: 39.5
Hemoglobin: 13.4
MCHC: 34
MCV: 95.5
Platelets: 369
RBC: 4.14
RDW: 13.1
WBC: 8.9

## 2011-09-27 LAB — URINALYSIS, ROUTINE W REFLEX MICROSCOPIC
Bilirubin Urine: NEGATIVE
Glucose, UA: NEGATIVE
Hgb urine dipstick: NEGATIVE
Ketones, ur: NEGATIVE
Nitrite: NEGATIVE
Protein, ur: NEGATIVE
Specific Gravity, Urine: 1.014
Urobilinogen, UA: 0.2
pH: 5

## 2011-09-27 LAB — TYPE AND SCREEN
ABO/RH(D): A POS
Antibody Screen: NEGATIVE

## 2011-09-27 LAB — BLOOD GAS, ARTERIAL
Acid-base deficit: 0.1
Bicarbonate: 23.8
Drawn by: 206361
FIO2: 0.21
O2 Saturation: 96.4
Patient temperature: 98.6
TCO2: 25
pCO2 arterial: 37.7
pH, Arterial: 7.417 — ABNORMAL HIGH
pO2, Arterial: 81.4

## 2011-09-27 LAB — COMPREHENSIVE METABOLIC PANEL
ALT: 18
AST: 16
Albumin: 4
Alkaline Phosphatase: 58
BUN: 10
CO2: 23
Calcium: 9
Chloride: 104
Creatinine, Ser: 0.54
GFR calc Af Amer: >60
GFR calc non Af Amer: >60
Glucose, Bld: 81
Potassium: 4
Sodium: 135
Total Bilirubin: 0.6
Total Protein: 6.7

## 2011-09-27 LAB — PROTIME-INR
INR: 0.9
Prothrombin Time: 12.6

## 2011-09-27 LAB — URINE MICROSCOPIC-ADD ON

## 2011-09-27 LAB — ANAEROBIC CULTURE

## 2011-09-27 LAB — GRAM STAIN

## 2011-09-27 LAB — FUNGUS CULTURE W SMEAR: Fungal Smear: NONE SEEN

## 2011-09-27 LAB — APTT: aPTT: 28

## 2011-09-27 LAB — CULTURE, ROUTINE-ABSCESS: Culture: NO GROWTH

## 2011-09-27 LAB — AFB CULTURE WITH SMEAR (NOT AT ARMC): Acid Fast Smear: NONE SEEN

## 2011-09-27 LAB — HCG, SERUM, QUALITATIVE: Preg, Serum: NEGATIVE

## 2011-09-27 LAB — ABO/RH: ABO/RH(D): A POS

## 2011-09-28 LAB — CBC
HCT: 33.8 — ABNORMAL LOW
HCT: 34.5 — ABNORMAL LOW
Hemoglobin: 11.7 — ABNORMAL LOW
Hemoglobin: 11.8 — ABNORMAL LOW
MCHC: 34.2
MCHC: 34.7
MCV: 95.7
MCV: 96.1
Platelets: 322
Platelets: 334
RBC: 3.53 — ABNORMAL LOW
RBC: 3.59 — ABNORMAL LOW
RDW: 13.1
RDW: 13.1
WBC: 10.5
WBC: 11.6 — ABNORMAL HIGH

## 2011-09-28 LAB — COMPREHENSIVE METABOLIC PANEL
ALT: 22
AST: 23
Albumin: 3.5
Alkaline Phosphatase: 54
BUN: 12
CO2: 29
Calcium: 8.9
Chloride: 98
Creatinine, Ser: 0.69
GFR calc Af Amer: >60
GFR calc non Af Amer: >60
Glucose, Bld: 124 — ABNORMAL HIGH
Potassium: 3.9
Sodium: 136
Total Bilirubin: 0.7
Total Protein: 6.3

## 2011-09-28 LAB — BLOOD GAS, ARTERIAL
Acid-Base Excess: 1.4
Bicarbonate: 26.3 — ABNORMAL HIGH
O2 Content: 4
O2 Saturation: 97.7
Patient temperature: 98.6
TCO2: 27.7
pCO2 arterial: 47.6 — ABNORMAL HIGH
pH, Arterial: 7.361
pO2, Arterial: 99.2

## 2011-09-28 LAB — BASIC METABOLIC PANEL
BUN: 10
CO2: 28
Calcium: 8.5
Chloride: 99
Creatinine, Ser: 0.63
GFR calc Af Amer: >60
GFR calc non Af Amer: >60
Glucose, Bld: 121 — ABNORMAL HIGH
Potassium: 3.6
Sodium: 134 — ABNORMAL LOW

## 2012-01-05 ENCOUNTER — Other Ambulatory Visit: Payer: Self-pay | Admitting: Internal Medicine

## 2012-01-18 ENCOUNTER — Other Ambulatory Visit: Payer: Self-pay | Admitting: Internal Medicine

## 2012-01-21 ENCOUNTER — Encounter: Payer: Self-pay | Admitting: Internal Medicine

## 2012-01-21 ENCOUNTER — Encounter: Payer: Self-pay | Admitting: *Deleted

## 2012-01-21 ENCOUNTER — Ambulatory Visit (HOSPITAL_BASED_OUTPATIENT_CLINIC_OR_DEPARTMENT_OTHER)
Admission: RE | Admit: 2012-01-21 | Discharge: 2012-01-21 | Disposition: A | Discharge status: Home / Self Care | Payer: BC Managed Care – PPO | Source: Ambulatory Visit | Attending: Internal Medicine | Admitting: Internal Medicine

## 2012-01-21 ENCOUNTER — Ambulatory Visit (INDEPENDENT_AMBULATORY_CARE_PROVIDER_SITE_OTHER): Payer: BC Managed Care – PPO | Admitting: Internal Medicine

## 2012-01-21 DIAGNOSIS — R0989 Other specified symptoms and signs involving the circulatory and respiratory systems: Secondary | ICD-10-CM

## 2012-01-21 DIAGNOSIS — J45909 Unspecified asthma, uncomplicated: Secondary | ICD-10-CM

## 2012-01-21 DIAGNOSIS — R059 Cough, unspecified: Secondary | ICD-10-CM

## 2012-01-21 DIAGNOSIS — R05 Cough: Secondary | ICD-10-CM | POA: Insufficient documentation

## 2012-01-21 MED ORDER — AZITHROMYCIN 250 MG PO TABS: ORAL_TABLET | ORAL | Status: AC

## 2012-01-21 MED ORDER — ALBUTEROL SULFATE HFA 108 (90 BASE) MCG/ACT IN AERS: 2.0000 | INHALATION_SPRAY | Freq: Four times a day (QID) | RESPIRATORY_TRACT | Status: DC | PRN

## 2012-01-21 MED ORDER — PREDNISONE 10 MG PO TABS: ORAL_TABLET | ORAL | Status: DC

## 2012-01-21 NOTE — Progress Notes (Signed)
  Subjective:    Patient ID: Rachel Norris, female    DOB: 06-13-1961, 51 y.o.   MRN: [id number]  HPI Acute visit. Symptoms is that a week ago with a mild headache and sinus congestion as well as a dry cough. 3 days ago symptoms increased with more cough, production of green/gray sputum, increased chest congestion and wheezing. She cough hard and had nausea and vomited once. 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 She did have a flu shot    Objective:   Physical Exam  Constitutional: She is oriented to person, place, and time. She appears well-developed. No distress.       Cough and audible chest congestion noted, no distress  HENT:  Head: Normocephalic and atraumatic.  Mouth/Throat: No oropharyngeal exudate.       Nose quite congested, face symmetric, nontender to palpation.  Cardiovascular: Normal rate, regular rhythm and normal heart sounds.   No murmur heard. Pulmonary/Chest:       Abundant larger free congestion noted, a few end expiratory wheezing. No crackles. No tachypnea or distress per se  Musculoskeletal: She exhibits no edema.  Neurological: She is oriented to person, place, and time.  Skin: Skin is warm and dry. She is not diaphoretic.  Psychiatric: She has a normal mood and affect. Her behavior is normal. Judgment and thought content normal.        Assessment & Plan:

## 2012-01-21 NOTE — Patient Instructions (Addendum)
[facility] the [facility] Rest, fluids , tylenol For cough, take Mucinex DM twice a day as needed Albuterol as needed for wheezing  Prednisone as prescribed  For congestion use Sudafed (pseudoephedrine) behind the counter 30 mg every 4 to 6 hours as needed Take the antibiotic as prescribed ----> zpack Call if no better in few days Call anytime if the symptoms are severe, you have high fever, short of breath

## 2012-01-21 NOTE — Assessment & Plan Note (Signed)
Asthma with acute exacerbation, despite the congestion, she is not hypoxic or in distress. She is slightly tachycardic. See instructions

## 2012-01-22 ENCOUNTER — Telehealth: Payer: Self-pay | Admitting: Internal Medicine

## 2012-01-22 ENCOUNTER — Encounter: Payer: Self-pay | Admitting: Internal Medicine

## 2012-01-22 NOTE — Telephone Encounter (Signed)
Patient is requesting x-ray results.

## 2012-01-22 NOTE — Telephone Encounter (Signed)
X-rays reviewed this morning, see report. I called the patient and left a message: X-rays show bronchitis, plan is the same

## 2012-01-30 NOTE — Telephone Encounter (Signed)
Ok x 6 months 

## 2012-01-30 NOTE — Telephone Encounter (Signed)
Refill done.  

## 2012-03-10 ENCOUNTER — Other Ambulatory Visit: Payer: Self-pay | Admitting: Internal Medicine

## 2012-03-14 ENCOUNTER — Ambulatory Visit (INDEPENDENT_AMBULATORY_CARE_PROVIDER_SITE_OTHER): Payer: BC Managed Care – PPO | Admitting: Internal Medicine

## 2012-03-14 ENCOUNTER — Encounter: Payer: Self-pay | Admitting: Internal Medicine

## 2012-03-14 VITALS — BP 132/82 | HR 59 | Temp 97.2°F | Wt 293.0 lb

## 2012-03-14 DIAGNOSIS — L609 Nail disorder, unspecified: Secondary | ICD-10-CM | POA: Insufficient documentation

## 2012-03-14 DIAGNOSIS — M79671 Pain in right foot: Secondary | ICD-10-CM

## 2012-03-14 DIAGNOSIS — M79609 Pain in unspecified limb: Secondary | ICD-10-CM

## 2012-03-14 NOTE — Assessment & Plan Note (Signed)
I am concerned about the  pigmentation of the nail, may need a biopsy. Refer to Dr. Bevelyn Buckles office

## 2012-03-14 NOTE — Progress Notes (Signed)
  Subjective:    Patient ID: Rachel Norris, female    DOB: February 09, 1961, 51 y.o.   MRN: [id number]  HPI Here for evaluation of feet pain. Problem is going on for more than a year, she has seen a podiatrist before, diagnosed with tendinitis after x-rays were done. Pain is getting worse for the last 2 weeks. Located at the inner aspect of the right foot and external aspect of the left foot. No ankle pain per se. The pain is only better when she rest the feet, is worse when she has lower extremity edema.  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 Good results.     Objective:   Physical Exam Alert oriented x3, morbidly obese female. Extremities: Trace pretibial edema bilaterally and symmetric. Ankles normal and normal range of motion. Feet  normal to inspection and palpation. I incidentally noted  the right great toe nail to be black, some of the hyperpigmentation even involves the skin      Assessment & Plan:

## 2012-03-14 NOTE — Assessment & Plan Note (Addendum)
Feet pain, likely multifactorial including her weight. Also she has been using "rocker shoes" I wonder if that has  made the pain worse. She already stopped wearing them. Plan: Strongly recommend weight loss, avoid edema by elevating her legs, taking her diuretics and a low-salt diet. Motrin when necessary. If not better, will refer to orthopedic surgery.

## 2012-04-24 ENCOUNTER — Ambulatory Visit (INDEPENDENT_AMBULATORY_CARE_PROVIDER_SITE_OTHER): Payer: BC Managed Care – PPO | Admitting: Internal Medicine

## 2012-04-24 DIAGNOSIS — Z23 Encounter for immunization: Secondary | ICD-10-CM

## 2012-04-24 DIAGNOSIS — Z0000 Encounter for general adult medical examination without abnormal findings: Secondary | ICD-10-CM | POA: Insufficient documentation

## 2012-04-24 DIAGNOSIS — L609 Nail disorder, unspecified: Secondary | ICD-10-CM

## 2012-04-24 LAB — CBC WITH DIFFERENTIAL/PLATELET
Basophils Absolute: 0.1 10*3/uL (ref 0.0–0.1)
Eosinophils Absolute: 0.2 10*3/uL (ref 0.0–0.7)
HCT: 38.8 % (ref 36.0–46.0)
Hemoglobin: 13.1 g/dL (ref 12.0–15.0)
Lymphs Abs: 1.5 10*3/uL (ref 0.7–4.0)
MCHC: 33.6 g/dL (ref 30.0–36.0)
MCV: 97.3 fl (ref 78.0–100.0)
Monocytes Absolute: 0.6 10*3/uL (ref 0.1–1.0)
Neutro Abs: 3.9 10*3/uL (ref 1.4–7.7)
Platelets: 326 10*3/uL (ref 150.0–400.0)
RDW: 13.1 % (ref 11.5–14.6)

## 2012-04-24 LAB — COMPREHENSIVE METABOLIC PANEL
ALT: 13 U/L (ref 0–35)
AST: 14 U/L (ref 0–37)
CO2: 26 mEq/L (ref 19–32)
Creatinine, Ser: 0.5 mg/dL (ref 0.4–1.2)
GFR: 133.03 mL/min (ref >60.00)
Sodium: 139 mEq/L (ref 135–145)
Total Bilirubin: 0.4 mg/dL (ref 0.3–1.2)
Total Protein: 6.8 g/dL (ref 6.0–8.3)

## 2012-04-24 LAB — TSH: TSH: 2.68 u[IU]/mL (ref 0.35–5.50)

## 2012-04-24 LAB — LIPID PANEL
Cholesterol: 186 mg/dL (ref 0–200)
VLDL: 12.6 mg/dL (ref 0.0–40.0)

## 2012-04-24 NOTE — Progress Notes (Signed)
  Subjective:    Patient ID: Rachel Norris, female    DOB: [birth year], 51 y.o.   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 Anxiety well controlled with Zoloft. No chest pain, shortness of breath. No nausea, vomiting diarrhea or blood in the stools. Occasion sees lower extremity edema. Also occasionally has palpitations, very rare, no associated symptoms, they are mild and for short periods of time.     Objective:   Physical Exam  General -- alert, well-developed, and overweight appearing .   Neck --no thyromegaly , normal carotid pulse Lungs -- normal respiratory effort, no intercostal retractions, no accessory muscle use, and normal breath sounds.   Heart-- normal rate, regular rhythm, no murmur, and no gallop.   Abdomen--soft, non-tender, no distention, no masses, no HSM, no guarding, and no rigidity.   Extremities-- no pretibial edema bilaterally Neurologic-- alert & oriented X3 and strength normal in all extremities. Psych-- Cognition and judgment appear intact. Alert and cooperative with normal attention span and concentration.  not anxious appearing and not depressed appearing.      Assessment & Plan:

## 2012-04-24 NOTE — Patient Instructions (Addendum)
Think about a diet: Atkins diet, [employer], Optifast, [employer] Watchers ? In addition to being active at work, I recommend a brisk walk 30 minutes a day. Start gradually.

## 2012-04-24 NOTE — Assessment & Plan Note (Addendum)
Td 2003, Tdap @ work 2012 per pt  Request a pneumonia shot, h/o asthma---> done  Labs Female care per gynecology, she is due for a mammogram. Never had a colonoscopy Extensive discussion about diet: Atkins diet, [employer], [employer]ast, [employer] Watchers ? Recommend daily, 30 min of  Exercise Chronic medical issues well controlled. Followup 6 months to monitor her weight

## 2012-04-24 NOTE — Assessment & Plan Note (Signed)
Status post dermatology eval, as a followup. Apparently did not require biopsy

## 2012-04-25 ENCOUNTER — Encounter: Payer: Self-pay | Admitting: Internal Medicine

## 2012-04-28 ENCOUNTER — Encounter: Payer: Self-pay | Admitting: Internal Medicine

## 2012-04-29 ENCOUNTER — Encounter: Payer: Self-pay | Admitting: *Deleted

## 2012-04-29 ENCOUNTER — Other Ambulatory Visit: Payer: Self-pay | Admitting: Internal Medicine

## 2012-04-29 NOTE — Telephone Encounter (Signed)
Refill done.  

## 2012-09-09 ENCOUNTER — Other Ambulatory Visit: Payer: Self-pay | Admitting: Internal Medicine

## 2012-09-09 NOTE — Telephone Encounter (Signed)
Refill done.  

## 2013-03-06 ENCOUNTER — Telehealth: Payer: Self-pay | Admitting: Internal Medicine

## 2013-03-06 ENCOUNTER — Other Ambulatory Visit: Payer: Self-pay | Admitting: Internal Medicine

## 2013-03-06 NOTE — Telephone Encounter (Signed)
Advise pt, I ref diovan as requested but need to clarify zoloft 50 mg dose: 1 tab qd? 2 tabs qd? Also overdue for a ROV, please arrange

## 2013-03-08 ENCOUNTER — Other Ambulatory Visit: Payer: Self-pay | Admitting: Internal Medicine

## 2013-03-09 ENCOUNTER — Other Ambulatory Visit: Payer: Self-pay | Admitting: Internal Medicine

## 2013-03-09 DIAGNOSIS — F329 Major depressive disorder, single episode, unspecified: Secondary | ICD-10-CM

## 2013-03-10 NOTE — Telephone Encounter (Signed)
Refills for Diovan and Zoloft sent to [facility]

## 2013-03-10 NOTE — Telephone Encounter (Signed)
Left message to call office

## 2013-03-12 MED ORDER — SERTRALINE HCL 50 MG PO TABS: 50.0000 mg | ORAL_TABLET | Freq: Every day | ORAL | Status: DC

## 2013-03-12 NOTE — Telephone Encounter (Signed)
Okay refills Zoloft for 3 months

## 2013-03-12 NOTE — Telephone Encounter (Signed)
Pt states that she is taking 1 tab of the Zoloft 50 mg. Med list updated. Pt made aware OV due but decline to schedule at this time will call back later for OV

## 2013-03-12 NOTE — Telephone Encounter (Signed)
Rx sent 

## 2013-04-20 ENCOUNTER — Other Ambulatory Visit: Payer: Self-pay | Admitting: Internal Medicine

## 2013-04-20 ENCOUNTER — Encounter: Payer: Self-pay | Admitting: *Deleted

## 2013-04-20 NOTE — Telephone Encounter (Signed)
Refill done.  Pt made aware OV is due now.

## 2013-04-21 ENCOUNTER — Other Ambulatory Visit: Payer: Self-pay | Admitting: Internal Medicine

## 2013-04-21 NOTE — Telephone Encounter (Signed)
Done

## 2013-04-21 NOTE — Telephone Encounter (Signed)
Refill done.  Pt made aware she is due for an OV.  

## 2013-07-20 ENCOUNTER — Encounter: Payer: Self-pay | Admitting: Internal Medicine

## 2013-07-20 ENCOUNTER — Ambulatory Visit (INDEPENDENT_AMBULATORY_CARE_PROVIDER_SITE_OTHER): Payer: BC Managed Care – PPO | Admitting: Internal Medicine

## 2013-07-20 VITALS — BP 120/80 | HR 74 | Temp 98.9°F | Ht 64.0 in | Wt 301.4 lb

## 2013-07-20 DIAGNOSIS — Z1231 Encounter for screening mammogram for malignant neoplasm of breast: Secondary | ICD-10-CM

## 2013-07-20 DIAGNOSIS — Z0000 Encounter for general adult medical examination without abnormal findings: Secondary | ICD-10-CM

## 2013-07-20 DIAGNOSIS — Z79899 Other long term (current) drug therapy: Secondary | ICD-10-CM

## 2013-07-20 DIAGNOSIS — E669 Obesity, unspecified: Secondary | ICD-10-CM

## 2013-07-20 LAB — CBC WITH DIFFERENTIAL/PLATELET
Basophils Relative: 0.7 % (ref 0.0–3.0)
Eosinophils Absolute: 0.1 10*3/uL (ref 0.0–0.7)
Hemoglobin: 13.8 g/dL (ref 12.0–15.0)
Lymphocytes Relative: 19.8 % (ref 12.0–46.0)
MCHC: 33.9 g/dL (ref 30.0–36.0)
MCV: 97.2 fl (ref 78.0–100.0)
Neutro Abs: 5.6 10*3/uL (ref 1.4–7.7)
RBC: 4.19 Mil/uL (ref 3.87–5.11)

## 2013-07-20 LAB — SEDIMENTATION RATE: Sed Rate: 22 mm/hr (ref 0–22)

## 2013-07-20 LAB — COMPREHENSIVE METABOLIC PANEL
AST: 15 U/L (ref 0–37)
Alkaline Phosphatase: 55 U/L (ref 39–117)
BUN: 18 mg/dL (ref 6–23)
Calcium: 9.5 mg/dL (ref 8.4–10.5)
Creatinine, Ser: 0.6 mg/dL (ref 0.4–1.2)
Total Bilirubin: 0.6 mg/dL (ref 0.3–1.2)

## 2013-07-20 LAB — HEMOGLOBIN A1C: Hgb A1c MFr Bld: 6.4 % (ref 4.6–6.5)

## 2013-07-20 LAB — LIPID PANEL
HDL: 49.6 mg/dL (ref >39.00)
VLDL: 21.6 mg/dL (ref 0.0–40.0)

## 2013-07-20 LAB — LDL CHOLESTEROL, DIRECT: Direct LDL: 144.2 mg/dL

## 2013-07-20 NOTE — Assessment & Plan Note (Signed)
Unable to lose weight, healthy diet is not consistent. Recommend to read about Belviq and Qsyma and let me know if interested

## 2013-07-20 NOTE — Progress Notes (Signed)
  Subjective:    Patient ID: Rachel Norris, female    DOB: [birth year], 52 y.o.   MRN: [id number]  HPI CPX   Past Medical History  Diagnosis Date  . Allergic rhinitis   . Anxiety and depression   . Hypertension   . Asthma     Dx 2009  . Perimenopausal    Past Surgical History  Procedure Laterality Date  . Cervix surgery    . 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 Marital Status: Married    Spouse Name: N/A    Number of Children: 0  . Years of Education: N/A   Occupational History  . not workin at present     Social History Main Topics  . Smoking status: Former Smoker    Quit date: 09/01/1999  . Smokeless tobacco: Never Used     Comment:    . Alcohol Use: Yes     Comment: rarely   . Drug Use: No  . Sexually Active: Not on file   Other Topics Concern  . Not on file   Social History Narrative   Used to works at the OR (Advertising account executive, [employer])           Family History  Problem Relation Age of Onset  . Diabetes Mother     M anmd others  . Colon cancer Neg Hx   . Breast cancer Neg Hx   . Dementia Mother   . Stroke Father     F and M  . Coronary artery disease Neg Hx   . AAA (abdominal aortic aneurysm) Father     Review of Systems Diet-- "stress eater", on-off healthy exercise -- no routine exercise  HTN--good medication compliance, no recent ambulatory BPs but they're usually okay. History of asthma and allergies takes allergy medication,  Flonase when necessary, very rarely use albuterol  No chest pain or shortness of breath Denies nausea, vomiting, diarrhea. Some urinary frequency lately, admits to drink a lot of caffeine. Denies dysuria, gross hematuria or difficulty urinating. No blurred vision or polydipsia.    Objective:   Physical Exam BP 120/80  Pulse 74  Temp(Src) 98.9 F (37.2 C) (Oral)  Ht 5\' 4"  (1.626  m)  Wt 301 lb 6.4 oz (136.714 kg)  BMI 51.71 kg/m2  SpO2 94%  LMP 06/15/2013 General -- alert, well-developed,NAD.   Neck --no thyromegaly Lungs -- normal respiratory effort, no intercostal retractions, no accessory muscle use, and normal breath sounds.   Heart-- normal rate, regular rhythm, no murmur, and no gallop.   Abdomen--soft, non-tender, no distention, no masses  Extremities-- no pretibial edema bilaterally  Neurologic-- alert & oriented X3 and strength normal in all extremities. Psych-- Cognition and judgment appear intact. Alert and cooperative with normal attention span and concentration.  not anxious appearing and not depressed appearing.       Assessment & Plan:

## 2013-07-20 NOTE — Assessment & Plan Note (Signed)
Tdap @ work 2012 per pt   pneumonia shot, h/o asthma---> done  2013  Female care per gynecology Last mammogram > 2 years, schedule one  Never had a colonoscopy, CCS discussed, provided iFOB, will call Cscope when ready, benefits of cscope discussed   diet, exercise discussed , Belviq -Qsyma?  labs , request sed rate and A1C for DM screening  Chronic medical issues well controlled.

## 2013-07-20 NOTE — Patient Instructions (Signed)
BELVIQ, QSYMA?

## 2013-07-27 ENCOUNTER — Encounter: Payer: Self-pay | Admitting: Internal Medicine

## 2013-07-27 ENCOUNTER — Ambulatory Visit (INDEPENDENT_AMBULATORY_CARE_PROVIDER_SITE_OTHER): Payer: BC Managed Care – PPO | Admitting: Internal Medicine

## 2013-07-27 VITALS — BP 130/65 | HR 101 | Temp 98.3°F | Wt 304.6 lb

## 2013-07-27 DIAGNOSIS — E119 Type 2 diabetes mellitus without complications: Secondary | ICD-10-CM | POA: Insufficient documentation

## 2013-07-27 NOTE — Progress Notes (Signed)
  Subjective:    Patient ID: Rachel Norris, female    DOB: June 05, 1961, 52 y.o.   MRN: [id number]  HPI Here to discuss recent A1C  Past Medical History  Diagnosis Date  . Allergic rhinitis   . Anxiety and depression   . Hypertension   . Asthma     Dx 2009  . Perimenopausal   . Diabetes 07/27/2013   Past Surgical History  Procedure Laterality Date  . Cervix surgery    . 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 Marital Status: Married    Spouse Name: N/A    Number of Children: 0  . Years of Education: N/A   Occupational History  . not workin at present     Social History Main Topics  . Smoking status: Former Smoker    Quit date: 09/01/1999  . Smokeless tobacco: Never Used     Comment:    . Alcohol Use: Yes     Comment: rarely   . Drug Use: No  . Sexually Active: Not on file   Other Topics Concern  . Not on file   Social History Narrative   Used to works at the OR Government social research officer, [employer])             Review of Systems     Objective:   Physical Exam BP 130/65  Pulse 101  Temp(Src) 98.3 F (36.8 C) (Oral)  Wt 304 lb 9.6 oz (138.166 kg)  BMI 52.26 kg/m2  SpO2 92%  LMP 06/15/2013 General -- alert, well-developed  Neurologic-- alert & oriented X3 and strength normal in all extremities. Psych-- Cognition and judgment appear intact. Alert and cooperative with normal attention span and concentration.  not anxious appearing and not depressed appearing.           Assessment & Plan:  Today , I spent more than 15  min with the patient, >50% of the time counseling

## 2013-07-27 NOTE — Patient Instructions (Addendum)
Next visit in 3 or 4 months

## 2013-07-27 NOTE — Assessment & Plan Note (Signed)
Recent A1c 6.4, LDL 144. The patient has mild diabetes, today we carefully discussed results. She was counseled about exercise. Also about diet, portion control, decreased carbohydrates intake. She plans to do some reading on her own, I will refer her to a nutritionist. She did her own research about FDA approved drugs to lose weight, she's not interested on those meds at this point. Next visit 3 or 4 months.

## 2013-08-11 ENCOUNTER — Ambulatory Visit: Payer: BC Managed Care – PPO

## 2013-08-12 ENCOUNTER — Other Ambulatory Visit (INDEPENDENT_AMBULATORY_CARE_PROVIDER_SITE_OTHER): Payer: BC Managed Care – PPO

## 2013-08-12 DIAGNOSIS — Z0000 Encounter for general adult medical examination without abnormal findings: Secondary | ICD-10-CM

## 2013-08-17 ENCOUNTER — Other Ambulatory Visit: Payer: Self-pay | Admitting: Internal Medicine

## 2013-08-17 NOTE — Telephone Encounter (Signed)
Refill done per protocol.  

## 2013-08-20 ENCOUNTER — Ambulatory Visit
Admission: RE | Admit: 2013-08-20 | Discharge: 2013-08-20 | Disposition: A | Discharge status: Home / Self Care | Payer: PRIVATE HEALTH INSURANCE | Source: Ambulatory Visit | Attending: Internal Medicine | Admitting: Internal Medicine

## 2013-08-20 DIAGNOSIS — Z1231 Encounter for screening mammogram for malignant neoplasm of breast: Secondary | ICD-10-CM

## 2013-10-27 ENCOUNTER — Ambulatory Visit: Payer: BC Managed Care – PPO | Admitting: Internal Medicine

## 2013-11-05 ENCOUNTER — Other Ambulatory Visit: Payer: Self-pay

## 2013-11-30 ENCOUNTER — Other Ambulatory Visit: Payer: Self-pay | Admitting: Internal Medicine

## 2013-11-30 NOTE — Telephone Encounter (Signed)
Diovan refilled per protocol 

## 2013-12-31 LAB — HM PAP SMEAR: HM PAP: NORMAL

## 2014-01-04 ENCOUNTER — Telehealth: Payer: Self-pay | Admitting: Internal Medicine

## 2014-01-04 MED ORDER — SERTRALINE HCL 50 MG PO TABS: 50.0000 mg | ORAL_TABLET | Freq: Every day | ORAL | Status: DC

## 2014-01-04 NOTE — Telephone Encounter (Signed)
rx refilled per protocol. DJR  

## 2014-01-04 NOTE — Telephone Encounter (Signed)
Patient is calling to request a change on her sertraline (ZOLOFT) 50 MG rx to a 90-day supply. Please advise.

## 2014-02-12 ENCOUNTER — Other Ambulatory Visit: Payer: Self-pay | Admitting: Internal Medicine

## 2014-03-04 ENCOUNTER — Other Ambulatory Visit: Payer: Self-pay | Admitting: Internal Medicine

## 2014-03-04 NOTE — Telephone Encounter (Signed)
Patient called and wanted to see if she could get her Zoloft refilled for 90 days. Please advise

## 2014-04-04 ENCOUNTER — Other Ambulatory Visit: Payer: Self-pay | Admitting: Internal Medicine

## 2014-04-07 ENCOUNTER — Other Ambulatory Visit: Payer: Self-pay | Admitting: Internal Medicine

## 2014-05-09 ENCOUNTER — Other Ambulatory Visit: Payer: Self-pay | Admitting: Internal Medicine

## 2014-05-10 NOTE — Telephone Encounter (Signed)
Valsartan refilled per protocol. OV due. JG//CMA

## 2014-05-15 ENCOUNTER — Other Ambulatory Visit: Payer: Self-pay | Admitting: Internal Medicine

## 2014-05-18 ENCOUNTER — Encounter: Payer: Self-pay | Admitting: Internal Medicine

## 2014-05-18 ENCOUNTER — Ambulatory Visit (INDEPENDENT_AMBULATORY_CARE_PROVIDER_SITE_OTHER): Payer: BC Managed Care – PPO | Admitting: Internal Medicine

## 2014-05-18 VITALS — BP 113/74 | HR 71 | Temp 98.3°F | Wt 295.0 lb

## 2014-05-18 DIAGNOSIS — E119 Type 2 diabetes mellitus without complications: Secondary | ICD-10-CM

## 2014-05-18 DIAGNOSIS — F411 Generalized anxiety disorder: Secondary | ICD-10-CM

## 2014-05-18 DIAGNOSIS — I10 Essential (primary) hypertension: Secondary | ICD-10-CM

## 2014-05-18 DIAGNOSIS — J45909 Unspecified asthma, uncomplicated: Secondary | ICD-10-CM

## 2014-05-18 MED ORDER — ALBUTEROL SULFATE HFA 108 (90 BASE) MCG/ACT IN AERS: 2.0000 | INHALATION_SPRAY | Freq: Four times a day (QID) | RESPIRATORY_TRACT | Status: DC | PRN

## 2014-05-18 MED ORDER — FLUTICASONE PROPIONATE 50 MCG/ACT NA SUSP: NASAL | Status: DC

## 2014-05-18 MED ORDER — HYDROCHLOROTHIAZIDE 25 MG PO TABS: ORAL_TABLET | ORAL | Status: DC

## 2014-05-18 NOTE — Assessment & Plan Note (Signed)
Under excellent control, hardly ever uses albuterol

## 2014-05-18 NOTE — Assessment & Plan Note (Signed)
Was able to self decreased gradually resolved, off meds for 2 days, likes to try without meds. She'll let me know if the  need to restart zoloft arises

## 2014-05-18 NOTE — Progress Notes (Signed)
Subjective:    Patient ID: Rachel Norris, female    DOB: 05-Jun-1961, 53 y.o.   MRN: [id number]  DOS:  05/18/2014 Type of  visit: Routine office visit Asthma, hardly ever uses albuterol Hypertension, good medication compliance, checks ambulatory BPs rarely, usually 130/80 Diabetes, doing better with diet, was able to lose 17 pounds, since then she has regained few. No ambulatory CBGs. Anxiety, depression, gradually self decreased Zoloft, last dose 2 days ago, feels like she likes to try without it.   ROS Denies chest pain or difficulty breathing No nausea, vomiting, diarrhea. No lower extremity paresthesias.    Past Medical History  Diagnosis Date  . Allergic rhinitis   . Anxiety and depression   . Hypertension   . Asthma     Dx 2009  . Perimenopausal   . Diabetes 07/27/2013    Past Surgical History  Procedure Laterality Date  . Cervix surgery    . 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 Marital Status: Married    Spouse Name: N/A    Number of Children: 0  . 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 Smoking status: Former Research scientist (life sciences)  . Smokeless tobacco: Never Used     Comment:  quit ~ 1997  . Alcohol Use: Yes     Comment: rarely   . Drug Use: No  . Sexual Activity: Not on file   Other Topics Concern  . Not on file   Social History Narrative                  Medication List       This list is accurate as of: 05/18/14  6:23 PM.  Always use your most recent med list.               albuterol 108 (90 BASE) MCG/ACT inhaler  Commonly known as:  VENTOLIN HFA  Inhale 2 puffs into the lungs 4 (four) times daily as needed.     Biotin 10 MG Caps  Take by mouth.     fluticasone 50 MCG/ACT nasal spray  Commonly known as:  FLONASE  USE 2 SPRAYS ON EACH SIDE OF THE NOSE ONCE  DAILY     hydrochlorothiazide 25 MG tablet  Commonly known as:  HYDRODIURIL  Take 1 tablet every day.     IBUPROFEN PO  Take 1-2 tablets by mouth as needed.     multivitamin tablet  Take 1 tablet by mouth daily.     valsartan 160 MG tablet  Commonly known as:  DIOVAN  TAKE 1 TABLET BY MOUTH EVERY DAY           Objective:   Physical Exam BP 113/74  Pulse 71  Temp(Src) 98.3 F (36.8 C) (Oral)  Wt 295 lb (133.811 kg)  SpO2 93%  General -- alert, well-developed, NAD.   HEENT-- Not pale. TMs normal Lungs -- normal respiratory effort, no intercostal retractions, no accessory muscle use, and normal breath sounds.  Heart-- normal rate, regular rhythm, no murmur.  DIABETIC FEET EXAM: trace lower extremity edema Normal pedal pulses bilaterally Skin normal, nails normal, no calluses Pinprick examination of the feet normal. Neurologic--  alert & oriented X3. Speech normal, gait normal, strength normal in all  extremities.  Psych-- Cognition and judgment appear intact. Cooperative with normal attention span and concentration. No anxious or depressed appearing.       Assessment & Plan:

## 2014-05-18 NOTE — Patient Instructions (Signed)
Come back fasting for labs this week: CMP --high blood pressure FLP--- hyperlipidemia A1c --diabetes     Next visit is for a physical exam in 4 months , fasting Please make an appointment     Two great books to learn about diabetes Diabetes fro Dummies "The [facility] Diabetes diet" book

## 2014-05-18 NOTE — Assessment & Plan Note (Signed)
seems well-controlled, check labs

## 2014-05-18 NOTE — Assessment & Plan Note (Signed)
was able to lose some weight. Doing better with lifestyle but still has room for improvement. Recommend to see a nutritionist at work Reports negative eye exam few months ago. Feet exam normal today. Plan: labs

## 2014-05-19 ENCOUNTER — Other Ambulatory Visit (INDEPENDENT_AMBULATORY_CARE_PROVIDER_SITE_OTHER): Payer: BC Managed Care – PPO

## 2014-05-19 ENCOUNTER — Telehealth: Payer: Self-pay | Admitting: Internal Medicine

## 2014-05-19 DIAGNOSIS — I10 Essential (primary) hypertension: Secondary | ICD-10-CM

## 2014-05-19 DIAGNOSIS — E785 Hyperlipidemia, unspecified: Secondary | ICD-10-CM

## 2014-05-19 DIAGNOSIS — E119 Type 2 diabetes mellitus without complications: Secondary | ICD-10-CM

## 2014-05-19 LAB — HEMOGLOBIN A1C: Hgb A1c MFr Bld: 6.4 % (ref 4.6–6.5)

## 2014-05-19 LAB — LIPID PANEL
CHOLESTEROL: 203 mg/dL — AB (ref 0–200)
HDL: 44.1 mg/dL (ref >39.00)
LDL Cholesterol: 136 mg/dL — ABNORMAL HIGH (ref 0–99)
Total CHOL/HDL Ratio: 5
Triglycerides: 113 mg/dL (ref 0.0–149.0)
VLDL: 22.6 mg/dL (ref 0.0–40.0)

## 2014-05-19 LAB — COMPREHENSIVE METABOLIC PANEL
ALBUMIN: 3.6 g/dL (ref 3.5–5.2)
ALT: 15 U/L (ref 0–35)
AST: 14 U/L (ref 0–37)
Alkaline Phosphatase: 49 U/L (ref 39–117)
BUN: 18 mg/dL (ref 6–23)
CALCIUM: 8.8 mg/dL (ref 8.4–10.5)
CHLORIDE: 104 meq/L (ref 96–112)
CO2: 28 meq/L (ref 19–32)
Creatinine, Ser: 0.5 mg/dL (ref 0.4–1.2)
GFR: 126.31 mL/min (ref >60.00)
GLUCOSE: 114 mg/dL — AB (ref 70–99)
POTASSIUM: 3.6 meq/L (ref 3.5–5.1)
SODIUM: 140 meq/L (ref 135–145)
Total Bilirubin: 0.4 mg/dL (ref 0.2–1.2)
Total Protein: 6.7 g/dL (ref 6.0–8.3)

## 2014-05-19 NOTE — Telephone Encounter (Signed)
Relevant patient education mailed to patient.  

## 2014-05-20 ENCOUNTER — Telehealth: Payer: Self-pay | Admitting: *Deleted

## 2014-05-20 ENCOUNTER — Encounter: Payer: Self-pay | Admitting: *Deleted

## 2014-05-20 DIAGNOSIS — E119 Type 2 diabetes mellitus without complications: Secondary | ICD-10-CM

## 2014-05-20 NOTE — Telephone Encounter (Signed)
Spoke with patient in regards to lab. She stated she prefers not to go on a medication at this time but would rather focus on diet and exercise as well as see a nutritionist. She is requesting a referral for a nutritionist for this. Please advise if this is okay.

## 2014-05-20 NOTE — Progress Notes (Signed)
Left message on patients voice mail to return my call regarding lab results.

## 2014-05-20 NOTE — Telephone Encounter (Signed)
Message copied by Chilton Greathouse on Thu May 20, 2014  3:24 PM ------      Message from: Kathlene November E      Created: Thu May 20, 2014  8:29 AM       Advise pt             The blood sugar test -A1C- remains the same at 6.4.      In addition to improve her life style, recommend metformin 850 mg 1 po bid #60, 3 RF (take only 1 tab a day the first week to get used to it).      Cholesterol about the same, will recheck in few months        ------

## 2014-05-21 NOTE — Telephone Encounter (Signed)
Referral to nutrition placed

## 2014-05-21 NOTE — Telephone Encounter (Signed)
That is ok. Arrange a referral to nutritionist, she works at [employer], better if referred to that system

## 2014-06-01 ENCOUNTER — Other Ambulatory Visit: Payer: Self-pay | Admitting: Internal Medicine

## 2014-06-01 DIAGNOSIS — F329 Major depressive disorder, single episode, unspecified: Secondary | ICD-10-CM

## 2014-06-01 DIAGNOSIS — F32A Depression, unspecified: Secondary | ICD-10-CM

## 2014-06-02 ENCOUNTER — Telehealth: Payer: Self-pay | Admitting: *Deleted

## 2014-06-02 DIAGNOSIS — F329 Major depressive disorder, single episode, unspecified: Secondary | ICD-10-CM

## 2014-06-02 DIAGNOSIS — F32A Depression, unspecified: Secondary | ICD-10-CM

## 2014-06-02 MED ORDER — SERTRALINE HCL 50 MG PO TABS: ORAL_TABLET | ORAL | Status: DC

## 2014-06-02 NOTE — Telephone Encounter (Signed)
[facility] [place]

## 2014-06-02 NOTE — Telephone Encounter (Signed)
Zoloft 50 mg: Half tablet daily for 2 weeks, then one tablet daily #30, 3 RF Call if sx persist

## 2014-06-02 NOTE — Telephone Encounter (Signed)
lmovm

## 2014-06-02 NOTE — Telephone Encounter (Signed)
Med refill zoloft 50 mg  Last OV- 05/18/14 noted that  She'll let me know if the need to restart zoloft arises

## 2014-07-07 ENCOUNTER — Telehealth: Payer: Self-pay | Admitting: *Deleted

## 2014-07-07 DIAGNOSIS — E119 Type 2 diabetes mellitus without complications: Secondary | ICD-10-CM

## 2014-07-07 MED ORDER — METFORMIN HCL 850 MG PO TABS: 850.0000 mg | ORAL_TABLET | Freq: Two times a day (BID) | ORAL | Status: DC

## 2014-07-07 NOTE — Telephone Encounter (Signed)
Caller name:  Janese Relation to pt:  self Call back number: [phone]  Pharmacy:  [facility]  Reason for call:   Pt called this morning to talk about going ahead and being put on Metformin.  She has done some research and been to the DM class and feels that this is a good medication to be on.    Please advise.

## 2014-07-07 NOTE — Telephone Encounter (Signed)
Called patient and left a detailed message on voice mail concerning Metformin.  Patient is to take Metformin 850 mg once daily for the first week then she is to increase it to 1 tablet twice daily on week #2. She is to continue to take 1 tablet twice daily thereafter.   [facility]

## 2014-07-07 NOTE — Telephone Encounter (Signed)
Left detailed message on voicemail.  

## 2014-07-09 ENCOUNTER — Telehealth: Payer: Self-pay | Admitting: Internal Medicine

## 2014-07-09 MED ORDER — GLUCOSE BLOOD VI STRP: ORAL_STRIP | Status: DC

## 2014-07-09 MED ORDER — ONETOUCH ULTRASOFT LANCETS MISC: Status: DC

## 2014-07-09 NOTE — Telephone Encounter (Signed)
Caller name: Lyn  Call back [place]: [facility]  Reason for call:  Pt is needing a script written out for the test strips and lancets for her One Touch Ultra 2 that she got from nutritionist.  Forgot to mention this when she spoke to the RN the other day.

## 2014-07-09 NOTE — Telephone Encounter (Signed)
rx sent

## 2014-08-01 ENCOUNTER — Other Ambulatory Visit: Payer: Self-pay | Admitting: Internal Medicine

## 2014-10-18 ENCOUNTER — Other Ambulatory Visit: Payer: Self-pay

## 2014-10-18 DIAGNOSIS — Z1239 Encounter for other screening for malignant neoplasm of breast: Secondary | ICD-10-CM

## 2014-10-28 ENCOUNTER — Ambulatory Visit
Admission: RE | Admit: 2014-10-28 | Discharge: 2014-10-28 | Disposition: A | Discharge status: Home / Self Care | Payer: BC Managed Care – PPO | Source: Ambulatory Visit

## 2014-10-28 ENCOUNTER — Other Ambulatory Visit: Payer: Self-pay

## 2014-10-28 DIAGNOSIS — Z1231 Encounter for screening mammogram for malignant neoplasm of breast: Secondary | ICD-10-CM

## 2015-04-03 NOTE — H&P (Signed)
Rachel Norris is an 54 y.o. female with irregular, heavy menstrual bleeding.  Ultrasound shows endometrial stripe 15.3 mm.  The patient is offered sonohysterogram with biopsy vs hysterosocpy, D&C and elects to proceed with the latter.  Additionally, she complains of bilateral labial inclusion cysts which are bothersome to her.  She wants those removed.  Pertinent Gynecological History: Menses: flow is excessive with use of 8 pads or tampons on heaviest days Bleeding: dysfunctional uterine bleeding Contraception: abstinence DES exposure: unknown Blood transfusions: none Sexually transmitted diseases: no past history Previous GYN Procedures: laser surgery of cervix  Last mammogram: normal Date: 09/2014 Last pap: normal Date: 2015 OB History: G0   Menstrual History: Menarche age: n/a No LMP recorded.    Past Medical History  Diagnosis Date  . Allergic rhinitis   . Anxiety and depression   . Hypertension   . Asthma     Dx 2009  . Perimenopausal   . Diabetes 07/27/2013    Past Surgical History  Procedure Laterality Date  . Cervix surgery    . 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 Diabetes Mother     M anmd others  . Colon cancer Neg Hx   . Breast cancer Neg Hx   . Dementia Mother   . Stroke Father     F and M  . Coronary artery disease Neg Hx   . AAA (abdominal aortic aneurysm) Father     Social History:  reports that she has quit smoking. She has never used smokeless tobacco. She reports that she drinks alcohol. She reports that she does not use illicit drugs.  Allergies:  Allergies  Allergen Reactions  . Sulfonamide Derivatives     No prescriptions prior to admission    ROS  There were no vitals taken for this visit. Physical Exam  Constitutional: She is oriented to person, place, and time. She appears well-developed and  well-nourished.  GI: Soft. There is no rebound and no guarding.  Neurological: She is alert and oriented to person, place, and time.  Skin: Skin is warm and dry.  Psychiatric: She has a normal mood and affect. Her behavior is normal.    No results found for this or any previous visit (from the past 24 hour(s)).  No results found.  Assessment/Plan: 54 yo G0 with irregular, heavy menstrual bleeding and bilateral labial inclusion cysts -Plan hysteroscopy, D&C, excision of bilateral labial inclusion cysts -Patient is counseled regarding the risk of bleeding, infection, scarring and damage to surrounding structures.  She understands the possibility of needing to convert to open procedure should uterine perforation occur.  She understands the possibility of poor wound healing of the labia and is counseled regarding postoperative expectations.  She also understands the possible recurrence of more inclusion cysts.  All questions were answered and the patient wishes to proceed.   Rachel Norris, [place] 04/03/2015, 4:35 PM

## 2015-04-05 ENCOUNTER — Encounter (HOSPITAL_BASED_OUTPATIENT_CLINIC_OR_DEPARTMENT_OTHER): Payer: Self-pay | Admitting: *Deleted

## 2015-04-05 NOTE — Progress Notes (Signed)
   04/05/15 1016  OBSTRUCTIVE SLEEP APNEA  Have you ever been diagnosed with sleep apnea through a sleep study? No  Do you snore loudly (loud enough to be heard through closed doors)?  1  Do you often feel tired, fatigued, or sleepy during the daytime? 0  Has anyone observed you stop breathing during your sleep? 1  Do you have, or are you being treated for high blood pressure? 1  BMI more than 35 kg/m2? 1  Age over 53 years old? 1  Gender: 0  Obstructive Sleep Apnea Score 5

## 2015-04-05 NOTE — Progress Notes (Signed)
NPO AFTER MN. ARRIVE AT 0600. NEEDS ISTAT , EKG AND URINE PREG.  WILL TAKE PEPCID AM DOS W/ SIPS OF WATER.

## 2015-04-07 ENCOUNTER — Encounter (HOSPITAL_BASED_OUTPATIENT_CLINIC_OR_DEPARTMENT_OTHER): Payer: Self-pay | Admitting: Anesthesiology

## 2015-04-07 NOTE — Anesthesia Preprocedure Evaluation (Addendum)
Anesthesia Evaluation  Patient identified by MRN, date of birth, ID band Patient awake    Reviewed: Allergy & Precautions, NPO status , Patient's Chart, lab work & pertinent test results  History of Anesthesia Complications (+) PONV and history of anesthetic complications  Airway Mallampati: II  TM Distance: >3 FB Neck ROM: Full    Dental no notable dental hx.    Pulmonary asthma , former smoker,  breath sounds clear to auscultation  Pulmonary exam normal       Cardiovascular Exercise Tolerance: Good hypertension, Pt. on medications Rhythm:Regular Rate:Normal     Neuro/Psych PSYCHIATRIC DISORDERS Anxiety Depression negative neurological ROS     GI/Hepatic Neg liver ROS, GERD-  Medicated,  Endo/Other  diabetes, Type 2Morbid obesity  Renal/GU negative Renal ROS  negative genitourinary   Musculoskeletal negative musculoskeletal ROS (+)   Abdominal (+) + obese,   Peds negative pediatric ROS (+)  Hematology negative hematology ROS (+)   Anesthesia Other Findings   Reproductive/Obstetrics negative OB ROS                          Anesthesia Physical Anesthesia Plan  ASA: III  Anesthesia Plan: General   Post-op Pain Management:    Induction: Intravenous  Airway Management Planned: Oral ETT  Additional Equipment:   Intra-op Plan:   Post-operative Plan:   Informed Consent: I have reviewed the patients History and Physical, chart, labs and discussed the procedure including the risks, benefits and alternatives for the proposed anesthesia with the patient or authorized representative who has indicated his/her understanding and acceptance.   Dental advisory given  Plan Discussed with: CRNA  Anesthesia Plan Comments: (Discussed MAC and general with patient. She states she has had nausea even with MAC in the past. Scopolamine patch ordered. Plan general with controlled airway.)       Anesthesia Quick Evaluation

## 2015-04-08 ENCOUNTER — Ambulatory Visit (HOSPITAL_BASED_OUTPATIENT_CLINIC_OR_DEPARTMENT_OTHER): Payer: BLUE CROSS/BLUE SHIELD | Admitting: Anesthesiology

## 2015-04-08 ENCOUNTER — Encounter (HOSPITAL_BASED_OUTPATIENT_CLINIC_OR_DEPARTMENT_OTHER): Payer: Self-pay

## 2015-04-08 ENCOUNTER — Ambulatory Visit (HOSPITAL_BASED_OUTPATIENT_CLINIC_OR_DEPARTMENT_OTHER)
Admission: RE | Admit: 2015-04-08 | Discharge: 2015-04-08 | Disposition: A | Discharge status: Home / Self Care | Payer: BLUE CROSS/BLUE SHIELD | Source: Ambulatory Visit | Attending: Obstetrics & Gynecology | Admitting: Obstetrics & Gynecology

## 2015-04-08 ENCOUNTER — Encounter (HOSPITAL_BASED_OUTPATIENT_CLINIC_OR_DEPARTMENT_OTHER)
Admission: RE | Disposition: A | Discharge status: Home / Self Care | Payer: Self-pay | Source: Ambulatory Visit | Attending: Obstetrics & Gynecology

## 2015-04-08 DIAGNOSIS — F329 Major depressive disorder, single episode, unspecified: Secondary | ICD-10-CM | POA: Diagnosis not present

## 2015-04-08 DIAGNOSIS — F418 Other specified anxiety disorders: Secondary | ICD-10-CM | POA: Insufficient documentation

## 2015-04-08 DIAGNOSIS — N926 Irregular menstruation, unspecified: Secondary | ICD-10-CM | POA: Insufficient documentation

## 2015-04-08 DIAGNOSIS — J45909 Unspecified asthma, uncomplicated: Secondary | ICD-10-CM | POA: Diagnosis not present

## 2015-04-08 DIAGNOSIS — I10 Essential (primary) hypertension: Secondary | ICD-10-CM | POA: Diagnosis not present

## 2015-04-08 DIAGNOSIS — Z87891 Personal history of nicotine dependence: Secondary | ICD-10-CM | POA: Diagnosis not present

## 2015-04-08 DIAGNOSIS — Z6841 Body Mass Index (BMI) 40.0 and over, adult: Secondary | ICD-10-CM | POA: Insufficient documentation

## 2015-04-08 DIAGNOSIS — Z0000 Encounter for general adult medical examination without abnormal findings: Secondary | ICD-10-CM

## 2015-04-08 DIAGNOSIS — N907 Vulvar cyst: Secondary | ICD-10-CM | POA: Diagnosis not present

## 2015-04-08 DIAGNOSIS — K219 Gastro-esophageal reflux disease without esophagitis: Secondary | ICD-10-CM | POA: Diagnosis not present

## 2015-04-08 DIAGNOSIS — N840 Polyp of corpus uteri: Secondary | ICD-10-CM | POA: Diagnosis not present

## 2015-04-08 DIAGNOSIS — E119 Type 2 diabetes mellitus without complications: Secondary | ICD-10-CM | POA: Diagnosis not present

## 2015-04-08 HISTORY — DX: Other specified personal risk factors, not elsewhere classified: Z91.89

## 2015-04-08 HISTORY — DX: Presence of spectacles and contact lenses: Z97.3

## 2015-04-08 HISTORY — DX: Vulvar cyst: N90.7

## 2015-04-08 HISTORY — DX: Other specified postprocedural states: Z98.890

## 2015-04-08 HISTORY — DX: Other specified postprocedural states: R11.2

## 2015-04-08 HISTORY — DX: Irregular menstruation, unspecified: N92.6

## 2015-04-08 HISTORY — DX: Prediabetes: R73.03

## 2015-04-08 HISTORY — DX: Abnormal findings on diagnostic imaging of other specified body structures: R93.89

## 2015-04-08 HISTORY — PX: EAR CYST EXCISION: SHX22

## 2015-04-08 HISTORY — DX: Gastro-esophageal reflux disease without esophagitis: K21.9

## 2015-04-08 HISTORY — PX: HYSTEROSCOPY W/D&C: SHX1775

## 2015-04-08 LAB — POCT I-STAT 4, (NA,K, GLUC, HGB,HCT)
Glucose, Bld: 106 mg/dL — ABNORMAL HIGH (ref 70–99)
HCT: 38 % (ref 36.0–46.0)
HEMOGLOBIN: 12.9 g/dL (ref 12.0–15.0)
Potassium: 3.8 mmol/L (ref 3.5–5.1)
Sodium: 139 mmol/L (ref 135–145)

## 2015-04-08 LAB — GLUCOSE, CAPILLARY: Glucose-Capillary: 171 mg/dL — ABNORMAL HIGH (ref 70–99)

## 2015-04-08 LAB — POCT PREGNANCY, URINE: Preg Test, Ur: NEGATIVE

## 2015-04-08 SURGERY — DILATATION AND CURETTAGE /HYSTEROSCOPY
Anesthesia: General | Site: Vulva

## 2015-04-08 MED ORDER — FENTANYL CITRATE 0.05 MG/ML IJ SOLN
INTRAMUSCULAR | Status: DC | PRN
  Administered 2015-04-08 (×2): 25 ug via INTRAVENOUS
  Administered 2015-04-08: 12.5 ug via INTRAVENOUS
  Administered 2015-04-08 (×5): 25 ug via INTRAVENOUS
  Administered 2015-04-08: 12.5 ug via INTRAVENOUS

## 2015-04-08 MED ORDER — ONDANSETRON HCL 4 MG/2ML IJ SOLN
INTRAMUSCULAR | Status: DC | PRN
  Administered 2015-04-08: 8 mg via INTRAVENOUS

## 2015-04-08 MED ORDER — PROMETHAZINE HCL 25 MG/ML IJ SOLN
INTRAMUSCULAR | Status: AC
  Filled 2015-04-08: qty 1

## 2015-04-08 MED ORDER — ACETAMINOPHEN 10 MG/ML IV SOLN
INTRAVENOUS | Status: DC | PRN
  Administered 2015-04-08: 1000 mg via INTRAVENOUS

## 2015-04-08 MED ORDER — LIDOCAINE HCL (CARDIAC) 20 MG/ML IV SOLN
INTRAVENOUS | Status: DC | PRN
  Administered 2015-04-08: 80 mg via INTRAVENOUS

## 2015-04-08 MED ORDER — SCOPOLAMINE 1 MG/3DAYS TD PT72
1.0000 | MEDICATED_PATCH | TRANSDERMAL | Status: DC
  Administered 2015-04-08: 1.5 mg via TRANSDERMAL
  Filled 2015-04-08: qty 1

## 2015-04-08 MED ORDER — LACTATED RINGERS IV SOLN
INTRAVENOUS | Status: DC
  Administered 2015-04-08 (×2): via INTRAVENOUS
  Filled 2015-04-08: qty 1000

## 2015-04-08 MED ORDER — PROMETHAZINE HCL 25 MG/ML IJ SOLN
6.2500 mg | INTRAMUSCULAR | Status: DC | PRN
  Administered 2015-04-08: 6.25 mg via INTRAVENOUS
  Filled 2015-04-08: qty 1

## 2015-04-08 MED ORDER — OXYCODONE-ACETAMINOPHEN 7.5-325 MG PO TABS: 1.0000 | ORAL_TABLET | ORAL | Status: DC | PRN

## 2015-04-08 MED ORDER — PROPOFOL 10 MG/ML IV BOLUS
INTRAVENOUS | Status: DC | PRN
  Administered 2015-04-08: 200 mg via INTRAVENOUS

## 2015-04-08 MED ORDER — IBUPROFEN 600 MG PO TABS: 600.0000 mg | ORAL_TABLET | Freq: Four times a day (QID) | ORAL | Status: DC | PRN

## 2015-04-08 MED ORDER — SCOPOLAMINE 1 MG/3DAYS TD PT72
MEDICATED_PATCH | TRANSDERMAL | Status: AC
  Filled 2015-04-08: qty 1

## 2015-04-08 MED ORDER — MIDAZOLAM HCL 2 MG/2ML IJ SOLN
INTRAMUSCULAR | Status: AC
  Filled 2015-04-08: qty 4

## 2015-04-08 MED ORDER — FENTANYL CITRATE 0.05 MG/ML IJ SOLN
INTRAMUSCULAR | Status: AC
  Filled 2015-04-08: qty 2

## 2015-04-08 MED ORDER — FENTANYL CITRATE 0.05 MG/ML IJ SOLN
INTRAMUSCULAR | Status: AC
  Filled 2015-04-08: qty 6

## 2015-04-08 MED ORDER — SUCCINYLCHOLINE CHLORIDE 20 MG/ML IJ SOLN
INTRAMUSCULAR | Status: DC | PRN
  Administered 2015-04-08: 100 mg via INTRAVENOUS

## 2015-04-08 MED ORDER — OXYCODONE HCL 5 MG PO TABS
5.0000 mg | ORAL_TABLET | Freq: Once | ORAL | Status: AC
  Administered 2015-04-08: 5 mg via ORAL
  Filled 2015-04-08: qty 1

## 2015-04-08 MED ORDER — LACTATED RINGERS IR SOLN
Status: DC | PRN
  Administered 2015-04-08: 3000 mL

## 2015-04-08 MED ORDER — KETOROLAC TROMETHAMINE 30 MG/ML IJ SOLN
INTRAMUSCULAR | Status: DC | PRN
  Administered 2015-04-08: 30 mg via INTRAVENOUS

## 2015-04-08 MED ORDER — FENTANYL CITRATE 0.05 MG/ML IJ SOLN
25.0000 ug | INTRAMUSCULAR | Status: DC | PRN
  Filled 2015-04-08: qty 1

## 2015-04-08 MED ORDER — METOCLOPRAMIDE HCL 5 MG/ML IJ SOLN
INTRAMUSCULAR | Status: DC | PRN
  Administered 2015-04-08: 10 mg via INTRAVENOUS

## 2015-04-08 MED ORDER — LACTATED RINGERS IV SOLN
INTRAVENOUS | Status: DC | PRN
  Administered 2015-04-08: 07:00:00 via INTRAVENOUS

## 2015-04-08 MED ORDER — CEFAZOLIN SODIUM 10 G IJ SOLR
3.0000 g | INTRAMUSCULAR | Status: AC
  Administered 2015-04-08: 3 g via INTRAVENOUS
  Filled 2015-04-08: qty 3000

## 2015-04-08 MED ORDER — DEXAMETHASONE SODIUM PHOSPHATE 4 MG/ML IJ SOLN
INTRAMUSCULAR | Status: DC | PRN
  Administered 2015-04-08: 10 mg via INTRAVENOUS

## 2015-04-08 MED ORDER — DEXTROSE IN LACTATED RINGERS 5 % IV SOLN
INTRAVENOUS | Status: DC
  Filled 2015-04-08: qty 1000

## 2015-04-08 MED ORDER — CEFAZOLIN SODIUM 1-5 GM-% IV SOLN
INTRAVENOUS | Status: AC
  Filled 2015-04-08: qty 50

## 2015-04-08 MED ORDER — MIDAZOLAM HCL 5 MG/5ML IJ SOLN
INTRAMUSCULAR | Status: DC | PRN
  Administered 2015-04-08 (×2): 0.5 mg via INTRAVENOUS
  Administered 2015-04-08: 1 mg via INTRAVENOUS

## 2015-04-08 MED ORDER — CEFAZOLIN SODIUM-DEXTROSE 2-3 GM-% IV SOLR
INTRAVENOUS | Status: AC
  Filled 2015-04-08: qty 50

## 2015-04-08 MED ORDER — OXYCODONE HCL 5 MG PO TABS
ORAL_TABLET | ORAL | Status: AC
  Filled 2015-04-08: qty 1

## 2015-04-08 SURGICAL SUPPLY — 45 items
BLADE SURG 15 STRL LF DISP TIS (BLADE) ×2 IMPLANT
BLADE SURG 15 STRL SS (BLADE) ×2
CANISTER SUCTION 2500CC (MISCELLANEOUS) ×4 IMPLANT
CATH ROBINSON RED A/P 16FR (CATHETERS) ×4 IMPLANT
CLOTH BEACON ORANGE TIMEOUT ST (SAFETY) ×4 IMPLANT
COVER BACK TABLE 60X90IN (DRAPES) ×8 IMPLANT
DRAIN PENROSE 18X1/4 LTX STRL (WOUND CARE) IMPLANT
DRAPE LG THREE QUARTER DISP (DRAPES) ×4 IMPLANT
DRAPE UNDERBUTTOCKS STRL (DRAPE) ×4 IMPLANT
DRSG TELFA 3X8 NADH (GAUZE/BANDAGES/DRESSINGS) ×4 IMPLANT
ELECT REM PT RETURN 9FT ADLT (ELECTROSURGICAL) ×4
ELECTRODE REM PT RTRN 9FT ADLT (ELECTROSURGICAL) ×2 IMPLANT
GAUZE IODOFORM PACK 1/2 7832 (GAUZE/BANDAGES/DRESSINGS) ×4 IMPLANT
GAUZE PACKING 1/4 X5 YD (GAUZE/BANDAGES/DRESSINGS) ×4 IMPLANT
GLOVE BIO SURGEON STRL SZ 6 (GLOVE) ×8 IMPLANT
GLOVE BIOGEL PI IND STRL 6 (GLOVE) ×4 IMPLANT
GLOVE BIOGEL PI IND STRL 6.5 (GLOVE) ×4 IMPLANT
GLOVE BIOGEL PI INDICATOR 6 (GLOVE) ×4
GLOVE BIOGEL PI INDICATOR 6.5 (GLOVE) ×4
GOWN STRL REUS W/ TWL LRG LVL3 (GOWN DISPOSABLE) ×4 IMPLANT
GOWN STRL REUS W/TWL LRG LVL3 (GOWN DISPOSABLE) ×4
LEGGING LITHOTOMY PAIR STRL (DRAPES) ×4 IMPLANT
LOOP ANGLED CUTTING 22FR (CUTTING LOOP) IMPLANT
NEEDLE HYPO 22GX1.5 SAFETY (NEEDLE) ×4 IMPLANT
NEEDLE SPNL 22GX3.5 QUINCKE BK (NEEDLE) ×4 IMPLANT
NS IRRIG 500ML POUR BTL (IV SOLUTION) ×4 IMPLANT
PACK BASIN DAY SURGERY FS (CUSTOM PROCEDURE TRAY) ×4 IMPLANT
PAD OB MATERNITY 4.3X12.25 (PERSONAL CARE ITEMS) ×4 IMPLANT
PAD PREP 24X48 CUFFED NSTRL (MISCELLANEOUS) ×4 IMPLANT
PENCIL BUTTON HOLSTER BLD 10FT (ELECTRODE) ×4 IMPLANT
SUT VIC AB 3-0 PS2 18 (SUTURE) ×6
SUT VIC AB 3-0 PS2 18XBRD (SUTURE) ×6 IMPLANT
SUT VIC AB 3-0 SH 27 (SUTURE) ×6
SUT VIC AB 3-0 SH 27X BRD (SUTURE) ×6 IMPLANT
SUT VICRYL 0 UR6 27IN ABS (SUTURE) IMPLANT
SWAB COLLECTION DEVICE MRSA (MISCELLANEOUS) IMPLANT
SYR CONTROL 10ML LL (SYRINGE) ×4 IMPLANT
TOWEL OR 17X24 6PK STRL BLUE (TOWEL DISPOSABLE) ×8 IMPLANT
TRAY DSU PREP LF (CUSTOM PROCEDURE TRAY) ×4 IMPLANT
TUBE ANAEROBIC SPECIMEN COL (MISCELLANEOUS) IMPLANT
TUBE CONNECTING 12'X1/4 (SUCTIONS)
TUBE CONNECTING 12X1/4 (SUCTIONS) IMPLANT
TUBE HYSTEROSCOPY W Y-CONNECT (TUBING) IMPLANT
WATER STERILE IRR 500ML POUR (IV SOLUTION) ×4 IMPLANT
YANKAUER SUCT BULB TIP NO VENT (SUCTIONS) IMPLANT

## 2015-04-08 NOTE — Anesthesia Procedure Notes (Signed)
Procedure Name: Intubation Date/Time: 04/08/2015 7:39 AM Performed by: Justice Rocher Pre-anesthesia Checklist: Patient identified, Emergency Drugs available, Suction available and Patient being monitored Patient Re-evaluated:Patient Re-evaluated prior to inductionOxygen Delivery Method: Circle System Utilized Preoxygenation: Pre-oxygenation with 100% oxygen Intubation Type: IV induction Ventilation: Mask ventilation without difficulty Laryngoscope Size: Mac and 4 Grade View: Grade III Tube type: Oral Tube size: 7.0 mm Number of attempts: 1 Airway Equipment and Method: Stylet and Oral airway Placement Confirmation: ETT inserted through vocal cords under direct vision,  positive ETCO2 and breath sounds checked- equal and bilateral Secured at: 22 cm Tube secured with: Tape Dental Injury: Teeth and Oropharynx as per pre-operative assessment

## 2015-04-08 NOTE — Op Note (Signed)
PREOPERATIVE DIAGNOSIS:  54 y.o. with irregular uterine bleeding, bilateral labial inclusion cysts  POSTOPERATIVE DIAGNOSIS: The same  PROCEDURE: Hysteroscopy, Dilation and Curettage, Excision of bilateral labial inclusion cysts  SURGEON:  Dr. Linda Hedges  INDICATIONS: 54 y.o.  here for scheduled surgery for hysteroscopy, D&C, and removal of bilateral labial inclusion cysts.  Risks of surgery were discussed with the patient including but not limited to: bleeding which may require transfusion; infection which may require antibiotics; injury to uterus or surrounding organs; intrauterine scarring which may impair future fertility; need for additional procedures including laparotomy or laparoscopy; and other postoperative/anesthesia complications. Written informed consent was obtained.    FINDINGS:  A 6 week size uterus.  Endometrial cavity filled with calcified fibroid.  Bilateral labial inclusion cysts.  ANESTHESIA:   General  ESTIMATED BLOOD LOSS:  Less than 20 ml  SPECIMENS: Endometrial curettings and bilateral labial cysts sent to pathology  COMPLICATIONS:  None immediate.  PROCEDURE DETAILS:  The patient received intravenous antibiotics while in the preoperative area.  She was then taken to the operating room where general anesthesia was administered and was found to be adequate.  After an adequate timeout was performed, she was placed in the dorsal lithotomy position and examined; then prepped and draped in the sterile manner.   Her bladder was catheterized for an unmeasured amount of clear, yellow urine. A speculum was then placed in the patient's vagina and a single tooth tenaculum was applied to the anterior lip of the cervix.  A 5.5 mm diagnostic hysteroscope was advanced without difficulty with the above dictated findings. The hysteroscope was removed under direct visualization.  A sharp curettage was then performed to obtain a moderate amount of endometrial curettings.  The tenaculum  was removed from the anterior lip of the cervix and the vaginal speculum was removed after noting good hemostasis.    Attention was then turned to the vulvar portion of the case.  A large ellipse and small ellipse were removed bilaterally to incorporate the inclusion cysts using a 15 blade and Bovie cautery.  A 2 cm area at the anterior forchette was excised in the same manner.  Scant bleeding was rendered hemostatic using Bovie cautery.  The skin edges were reapproximated using 3-0 Vicryl in interrupted sutures, ensuring no tension on suture line.   Sponge, lap, and needle counts were correct.  The patient tolerated the procedure well and was taken to the recovery area awake, extubated and in stable condition.

## 2015-04-08 NOTE — Transfer of Care (Signed)
Immediate Anesthesia Transfer of Care Note  Patient: Rachel Norris  Procedure(s) Performed: Procedure(s) (LRB): DILATATION AND CURETTAGE /HYSTEROSCOPY, removal of bilateral labial inclusion cysts (N/A) removal of bilateral labial inclusion cysts (Bilateral)  Patient Location: PACU  Anesthesia Type: General  Level of Consciousness: awake, sedated, patient cooperative and responds to stimulation  Airway & Oxygen Therapy: Patient Spontanous Breathing and Patient connected to face mask oxygen  Post-op Assessment: Report given to PACU RN, Post -op Vital signs reviewed and stable and Patient moving all extremities  Post vital signs: Reviewed and stable  Complications: No apparent anesthesia complications

## 2015-04-08 NOTE — Discharge Instructions (Signed)
° °  D & C Home care Instructions:   Personal hygiene:  Used sanitary napkins for vaginal drainage not tampons. Shower or tub bathe the day after your procedure. No douching until bleeding stops. Always wipe from front to back after  Elimination.  Activity: Do not drive or operate any equipment today. The effects of the anesthesia are still present and drowsiness may result. Rest today, not necessarily flat bed rest, just take it easy. You may resume your normal activity in one to 2 days.  Sexual activity: No intercourse for one week or as indicated by your physician  Diet: Eat a light diet as desired this evening. You may resume a regular diet tomorrow.  Return to work: One to 2 days.  General Expectations of your surgery: Vaginal bleeding should be no heavier than a normal period. Spotting may continue up to 10 days. Mild cramps may continue for a couple of days. You may have a regular period in 2-6 weeks.  Unexpected observations call your doctor if these occur: persistent or heavy bleeding. Severe abdominal cramping or pain. Elevation of temperature greater than 100F.  Call for an appointment in one week.    Patient's Signature_______________________________________________________  Nurse's Signature________________________________________________________   Activity: Get plenty of rest for the remainder of the day. A responsible adult should stay with you for 24 hours following the procedure.  For the next 24 hours, DO NOT: -Drive a car -Paediatric nurse -Drink alcoholic beverages -Take any medication unless instructed by your physician -Make any legal decisions or sign important papers.  Meals: Start with liquid foods such as gelatin or soup. Progress to regular foods as tolerated. Avoid greasy, spicy, heavy foods. If nausea and/or vomiting occur, drink only clear liquids until the nausea and/or vomiting subsides. Call your physician if vomiting continues.  Special  Instructions/Symptoms: Your throat may feel dry or sore from the anesthesia or the breathing tube placed in your throat during surgery. If this causes discomfort, gargle with warm salt water. The discomfort should disappear within 24 hours.  If you had a scopolamine patch placed behind your ear for the management of post- operative nausea and/or vomiting:  1. The medication in the patch is effective for 72 hours, after which it should be removed.  Wrap patch in a tissue and discard in the trash. Wash hands thoroughly with soap and water. 2. You may remove the patch earlier than 72 hours if you experience unpleasant side effects which may include dry mouth, dizziness or visual disturbances. 3. Avoid touching the patch. Wash your hands with soap and water after contact with the patch.   Call MD for T>100.4, heavy vaginal bleeding, severe abdominal pain, or respiratory distress.  Apply ice packs to perineum 10 minutes each hour as tolerated for 72 hours.  Alternate Percocet and Ibuprofen for pain control.  No driving while taking narcotics.  Call office to schedule postop appointment in 1 week.

## 2015-04-08 NOTE — Anesthesia Postprocedure Evaluation (Signed)
  Anesthesia Post-op Note  Patient: Rachel Norris  Procedure(s) Performed: Procedure(s) (LRB): DILATATION AND CURETTAGE /HYSTEROSCOPY, removal of bilateral labial inclusion cysts (N/A) removal of bilateral labial inclusion cysts (Bilateral)  Patient Location: PACU  Anesthesia Type: General  Level of Consciousness: awake and alert   Airway and Oxygen Therapy: Patient Spontanous Breathing  Post-op Pain: mild  Post-op Assessment: Post-op Vital signs reviewed, Patient's Cardiovascular Status Stable, Respiratory Function Stable, Patent Airway and No signs of Nausea or vomiting  Last Vitals:  Filed Vitals:   04/08/15 0915  BP: 114/72  Pulse: 84  Temp:   Resp: 16    Post-op Vital Signs: stable   Complications: No apparent anesthesia complications

## 2015-04-08 NOTE — Op Note (Signed)
No change to H&P.  Mayelin Panos, DO 

## 2015-04-11 ENCOUNTER — Encounter (HOSPITAL_BASED_OUTPATIENT_CLINIC_OR_DEPARTMENT_OTHER): Payer: Self-pay | Admitting: Obstetrics & Gynecology

## 2015-04-21 ENCOUNTER — Encounter: Payer: Self-pay | Admitting: Obstetrics and Gynecology

## 2015-05-04 ENCOUNTER — Other Ambulatory Visit: Payer: Self-pay | Admitting: Internal Medicine

## 2015-06-07 ENCOUNTER — Other Ambulatory Visit: Payer: Self-pay | Admitting: Internal Medicine

## 2015-06-08 ENCOUNTER — Other Ambulatory Visit: Payer: Self-pay | Admitting: Internal Medicine

## 2015-06-08 MED ORDER — HYDROCHLOROTHIAZIDE 25 MG PO TABS: 25.0000 mg | ORAL_TABLET | Freq: Every day | ORAL | Status: DC

## 2015-06-08 MED ORDER — VALSARTAN 160 MG PO TABS: 160.0000 mg | ORAL_TABLET | Freq: Every day | ORAL | Status: DC

## 2015-06-08 NOTE — Telephone Encounter (Signed)
[facility] for [facility] day supply only.

## 2015-06-08 NOTE — Telephone Encounter (Signed)
Caller name: Nickey Canedo Relationship to patient: self Can be reached: [phone] [facility] [address]  Reason for call: Pt called for refill on hydrochlorothiazide (HYDRODIURIL) 25 MG tablet. She will be out Sunday. She has appt sched for 10/11/15.  Pt will also need refill on valsartan (DIOVAN) 160 MG tablet. She has about a week left. Can refills be called in to last until her appt?

## 2015-06-08 NOTE — Telephone Encounter (Signed)
Left msg for pt that refills were sent in.

## 2015-06-08 NOTE — Telephone Encounter (Signed)
I will not be able to refill for that long. She will need a F/U appt before then, she has not been seen since 04/2014. I will refill for 30 days but she will need to be seen before next refill.

## 2015-07-06 ENCOUNTER — Other Ambulatory Visit: Payer: Self-pay | Admitting: Internal Medicine

## 2015-07-12 ENCOUNTER — Ambulatory Visit (INDEPENDENT_AMBULATORY_CARE_PROVIDER_SITE_OTHER): Payer: BLUE CROSS/BLUE SHIELD | Admitting: Internal Medicine

## 2015-07-12 ENCOUNTER — Encounter: Payer: Self-pay | Admitting: Internal Medicine

## 2015-07-12 VITALS — BP 122/76 | HR 76 | Temp 97.6°F | Ht 64.0 in | Wt 299.4 lb

## 2015-07-12 DIAGNOSIS — I10 Essential (primary) hypertension: Secondary | ICD-10-CM

## 2015-07-12 DIAGNOSIS — E785 Hyperlipidemia, unspecified: Secondary | ICD-10-CM

## 2015-07-12 DIAGNOSIS — Z23 Encounter for immunization: Secondary | ICD-10-CM

## 2015-07-12 DIAGNOSIS — E119 Type 2 diabetes mellitus without complications: Secondary | ICD-10-CM

## 2015-07-12 DIAGNOSIS — R350 Frequency of micturition: Secondary | ICD-10-CM

## 2015-07-12 DIAGNOSIS — Z114 Encounter for screening for human immunodeficiency virus [HIV]: Secondary | ICD-10-CM

## 2015-07-12 DIAGNOSIS — R5383 Other fatigue: Secondary | ICD-10-CM | POA: Insufficient documentation

## 2015-07-12 LAB — LIPID PANEL
Cholesterol: 192 mg/dL (ref 0–200)
HDL: 56.3 mg/dL (ref >39.00)
LDL CALC: 117 mg/dL — AB (ref 0–99)
NONHDL: 135.7
TRIGLYCERIDES: 93 mg/dL (ref 0.0–149.0)
Total CHOL/HDL Ratio: 3
VLDL: 18.6 mg/dL (ref 0.0–40.0)

## 2015-07-12 LAB — COMPREHENSIVE METABOLIC PANEL
ALK PHOS: 60 U/L (ref 39–117)
ALT: 18 U/L (ref 0–35)
AST: 15 U/L (ref 0–37)
Albumin: 3.9 g/dL (ref 3.5–5.2)
BUN: 16 mg/dL (ref 6–23)
CO2: 28 meq/L (ref 19–32)
Calcium: 9.2 mg/dL (ref 8.4–10.5)
Chloride: 102 mEq/L (ref 96–112)
Creatinine, Ser: 0.48 mg/dL (ref 0.40–1.20)
GFR: 144.05 mL/min (ref >60.00)
GLUCOSE: 115 mg/dL — AB (ref 70–99)
Potassium: 3.6 mEq/L (ref 3.5–5.1)
Sodium: 138 mEq/L (ref 135–145)
TOTAL PROTEIN: 7 g/dL (ref 6.0–8.3)
Total Bilirubin: 0.4 mg/dL (ref 0.2–1.2)

## 2015-07-12 LAB — TSH: TSH: 2.28 u[IU]/mL (ref 0.35–4.50)

## 2015-07-12 LAB — URINALYSIS, ROUTINE W REFLEX MICROSCOPIC
Bilirubin Urine: NEGATIVE
HGB URINE DIPSTICK: NEGATIVE
Ketones, ur: NEGATIVE
Leukocytes, UA: NEGATIVE
NITRITE: NEGATIVE
PH: 5.5 (ref 5.0–8.0)
Specific Gravity, Urine: 1.025 (ref 1.000–1.030)
TOTAL PROTEIN, URINE-UPE24: NEGATIVE
URINE GLUCOSE: NEGATIVE
Urobilinogen, UA: 0.2 (ref 0.0–1.0)

## 2015-07-12 LAB — CBC WITH DIFFERENTIAL/PLATELET
BASOS PCT: 0.5 % (ref 0.0–3.0)
Basophils Absolute: 0 10*3/uL (ref 0.0–0.1)
EOS ABS: 0.1 10*3/uL (ref 0.0–0.7)
EOS PCT: 1.7 % (ref 0.0–5.0)
HCT: 39.8 % (ref 36.0–46.0)
HEMOGLOBIN: 13.6 g/dL (ref 12.0–15.0)
LYMPHS ABS: 1.6 10*3/uL (ref 0.7–4.0)
Lymphocytes Relative: 18.5 % (ref 12.0–46.0)
MCHC: 34 g/dL (ref 30.0–36.0)
MCV: 95.4 fl (ref 78.0–100.0)
Monocytes Absolute: 0.8 10*3/uL (ref 0.1–1.0)
Monocytes Relative: 8.7 % (ref 3.0–12.0)
Neutro Abs: 6.2 10*3/uL (ref 1.4–7.7)
Neutrophils Relative %: 70.6 % (ref 43.0–77.0)
Platelets: 337 10*3/uL (ref 150.0–400.0)
RBC: 4.17 Mil/uL (ref 3.87–5.11)
RDW: 13.4 % (ref 11.5–15.5)
WBC: 8.8 10*3/uL (ref 4.0–10.5)

## 2015-07-12 LAB — HM DIABETES FOOT EXAM: HM Diabetic Foot Exam: NORMAL

## 2015-07-12 LAB — HEMOGLOBIN A1C: Hgb A1c MFr Bld: 6.1 % (ref 4.6–6.5)

## 2015-07-12 MED ORDER — VALSARTAN 160 MG PO TABS: 160.0000 mg | ORAL_TABLET | Freq: Every day | ORAL | Status: DC

## 2015-07-12 MED ORDER — HYDROCHLOROTHIAZIDE 25 MG PO TABS: 25.0000 mg | ORAL_TABLET | Freq: Every day | ORAL | Status: DC

## 2015-07-12 NOTE — Assessment & Plan Note (Signed)
+  OSA screening   at the time of surgery in April, [name] slepness scale today 7 which is negative. Plan: Check a TSH, CBC, recommend weight loss, reassess periodically

## 2015-07-12 NOTE — Progress Notes (Signed)
Pre visit review using our clinic review tool, if applicable. No additional management support is needed unless otherwise documented below in the visit note. 

## 2015-07-12 NOTE — Progress Notes (Signed)
Subjective:    Patient ID: Rachel Norris, female    DOB: 1961/01/04, 54 y.o.   MRN: [id number]  DOS:  07/12/2015 Type of visit - description : Routine visit, last visit approximately a year ago Interval history: Hypertension, good compliance of medication, ambulatory BPs within normal +Screening for sleep apnea the time of recent surgery, she admits to feeling fatigued but usually not sleeping once she is start her regular physical activities. Some  snoring. Diabetes, ambulatory blood sugars around 112, last year she was able to loose 20 pounds but she regained them. Asthma, needed inhaler lately   Review of Systems No chest pain, difficulty breathing, + occasionally ankle swelling. Admits to a stress at work, sometimes have difficulty sleeping. Status post D&C, taken ibuprofen but only 2 or 3 times a week Occasionally urinary frequency, request a UA  Past Medical History  Diagnosis Date  . Allergic rhinitis   . Anxiety and depression   . Hypertension   . Perimenopausal   . PONV (postoperative nausea and vomiting)   . Prediabetes   . Asthma     Dx 2009  . Wears glasses   . GERD (gastroesophageal reflux disease)   . Irregular menstrual bleeding   . Labial cyst     bilateral icclusion  . Thickened endometrium   . At risk for sleep apnea     STOP-BANG= 5            SENT TO PCP 04-05-2015    Past Surgical History  Procedure Laterality Date  . Laser ablation of the cervix  1992    DYSPLAGIA  . 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 Hysteroscopy w/d&c N/A 04/08/2015    Procedure: DILATATION AND CURETTAGE /HYSTEROSCOPY, removal of bilateral labial inclusion cysts;  Surgeon: Linda Hedges, DO;  Location: [place];  Service: Gynecology;  Laterality: N/A;  . Ear cyst excision Bilateral 04/08/2015    Procedure: removal of bilateral labial  inclusion cysts;  Surgeon: Linda Hedges, DO;  Location: [place];  Service: Gynecology;  Laterality: Bilateral;  . Dilation and curettage of uterus  2016    History   Social History  . Marital Status: Married    Spouse Name: N/A  . Number of Children: 0  . 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 Smoking status: Former Smoker -- 1.00 packs/day for 20 years    Types: Cigarettes    Quit date: 04/04/1996  . Smokeless tobacco: Never Used  . Alcohol Use: Yes     Comment: rare  . Drug Use: Yes  . Sexual Activity: Not on file   Other Topics Concern  . Not on file   Social History Narrative   Lives w/ husband               Medication List       This list is accurate as of: 07/12/15  5:18 PM.  Always use your most recent med list.               albuterol 108 (90 BASE) MCG/ACT inhaler  Commonly known as:  VENTOLIN HFA  Inhale 2 puffs into the lungs 4 (four) times daily as needed.     famotidine 20 MG tablet  Commonly known as:  PEPCID  Take  20 mg by mouth as needed for heartburn or indigestion.     fluticasone 50 MCG/ACT nasal spray  Commonly known as:  FLONASE  USE 2 SPRAYS ON EACH SIDE OF THE NOSE ONCE DAILY     hydrochlorothiazide 25 MG tablet  Commonly known as:  HYDRODIURIL  Take 1 tablet (25 mg total) by mouth daily.     ibuprofen 600 MG tablet  Commonly known as:  ADVIL  Take 1 tablet (600 mg total) by mouth every 6 (six) hours as needed.     multivitamin tablet  Take 1 tablet by mouth daily.     Naproxen Sodium 220 MG Caps  Take 1 capsule by mouth as needed.     oxyCODONE-acetaminophen 7.5-325 MG per tablet  Commonly known as:  PERCOCET  Take 1 tablet by mouth every 4 (four) hours as needed for pain.     valsartan 160 MG tablet  Commonly known as:  DIOVAN  Take 1 tablet (160 mg total) by mouth daily.           Objective:   Physical Exam BP 122/76 mmHg   Pulse 76  Temp(Src) 97.6 F (36.4 C) (Oral)  Ht 5\' 4"  (1.626 m)  Wt 299 lb 6 oz (135.796 kg)  BMI 51.36 kg/m2  SpO2 97%  LMP  General:   Well developed, well nourished . NAD.  HEENT:  Normocephalic . Face symmetric, atraumatic Lungs:  CTA B Normal respiratory effort, no intercostal retractions, no accessory muscle use. Heart: RRR,  no murmur.  No pretibial edema bilaterally  Skin: Not pale. Not jaundice Diabetic foot exam: No edema today, normal pedal pulses and pinprick examination Neurologic:  alert & oriented X3.  Speech normal, gait appropriate for age and unassisted Psych--  Cognition and judgment appear intact.  Cooperative with normal attention span and concentration.  Behavior appropriate. No anxious or depressed appearing.     Assessment & Plan:

## 2015-07-12 NOTE — Assessment & Plan Note (Signed)
Feet exam negative today, recommend eye check. Labs Diet and exercise discussed

## 2015-07-12 NOTE — Patient Instructions (Signed)
Get your blood work before you leave    

## 2015-07-12 NOTE — Assessment & Plan Note (Signed)
Not an issue at this time

## 2015-07-12 NOTE — Assessment & Plan Note (Signed)
Continue losartan and HCTZ, check a CMP

## 2015-07-13 LAB — HIV ANTIBODY (ROUTINE TESTING W REFLEX): HIV 1&2 Ab, 4th Generation: NONREACTIVE

## 2015-07-17 ENCOUNTER — Other Ambulatory Visit: Payer: Self-pay | Admitting: Internal Medicine

## 2015-07-22 ENCOUNTER — Other Ambulatory Visit: Payer: Self-pay | Admitting: Internal Medicine

## 2015-10-06 ENCOUNTER — Ambulatory Visit (INDEPENDENT_AMBULATORY_CARE_PROVIDER_SITE_OTHER): Payer: BLUE CROSS/BLUE SHIELD | Admitting: Internal Medicine

## 2015-10-06 ENCOUNTER — Encounter: Payer: Self-pay | Admitting: Internal Medicine

## 2015-10-06 VITALS — BP 116/66 | HR 78 | Temp 97.6°F | Ht 64.0 in | Wt 298.5 lb

## 2015-10-06 DIAGNOSIS — Z09 Encounter for follow-up examination after completed treatment for conditions other than malignant neoplasm: Secondary | ICD-10-CM

## 2015-10-06 DIAGNOSIS — F411 Generalized anxiety disorder: Secondary | ICD-10-CM | POA: Diagnosis not present

## 2015-10-06 MED ORDER — OMEPRAZOLE 40 MG PO CPDR: 40.0000 mg | DELAYED_RELEASE_CAPSULE | Freq: Every day | ORAL | Status: DC

## 2015-10-06 NOTE — Progress Notes (Signed)
Pre visit review using our clinic review tool, if applicable. No additional management support is needed unless otherwise documented below in the visit note. 

## 2015-10-06 NOTE — Progress Notes (Signed)
Subjective:    Patient ID: Rachel Norris, female    DOB: [birth year], 54 y.o.   MRN: [id number]  DOS:  10/06/2015 Type of visit - description : Acute visit, multiple symptoms Interval history: Several months ago was told she wasn't perimenopausal: Feeling bloated, fluid retention, abnormal vag bleed. Gynecology prescribed progesterone >>> she couldn't tolerate it due to headaches and simply not feeling well. For the last 4-5 weeks she has not been able to sleep well, sleep is interrupted, gynecology prescribed Ambien but has not tried it yet.  She feels very fatigued throughout the day. Also for the last week has noted more edema than usual around the ankles and in the distal feet. Hands are also puffy. She is under a lot of stress related to the sudden death of her sister 2015-09-08. Also her husband just  had back surgery and she is working long hours at the hospital.   Wt Readings from Last 3 Encounters:  10/06/15 298 lb 8 oz (135.399 kg)  07/12/15 299 lb 6 oz (135.796 kg)  04/08/15 296 lb (134.265 kg)    Review of Systems  No major changes in her weight. No chest pain, difficulty breathing, occasional palpitations, she feels related to anxiety. No orthopnea, uses two pillows years. No recent airplane trips or prolonged car trips, no calf pain. Has developed GERD symptoms, heartburn. Better with omeprazole. Prescription? No suicidal ideas  Past Medical History  Diagnosis Date  . Allergic rhinitis   . Anxiety and depression   . Hypertension   . Perimenopausal   . PONV (postoperative nausea and vomiting)   . Prediabetes   . Asthma     Dx 2009  . Wears glasses   . GERD (gastroesophageal reflux disease)   . Irregular menstrual bleeding   . Labial cyst     bilateral icclusion  . Thickened endometrium   . At risk for sleep apnea     STOP-BANG= 5            SENT TO PCP 04-05-2015    Past Surgical History  Procedure Laterality Date  . Laser ablation of the cervix   1992    DYSPLAGIA  . 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 Hysteroscopy w/d&c N/A 04/08/2015    Procedure: DILATATION AND CURETTAGE /HYSTEROSCOPY, removal of bilateral labial inclusion cysts;  Surgeon: Linda Hedges, DO;  Location: [place];  Service: Gynecology;  Laterality: N/A;  . Ear cyst excision Bilateral 04/08/2015    Procedure: removal of bilateral labial inclusion cysts;  Surgeon: Linda Hedges, DO;  Location: [place];  Service: Gynecology;  Laterality: Bilateral;  . Dilation and curettage of uterus  2016    Social History   Social History  . Marital Status: Married    Spouse Name: N/A  . Number of Children: 0  . 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 Smoking status: Former Smoker -- 1.00 packs/day for 20 years    Types: Cigarettes    Quit date: 04/04/1996  . Smokeless tobacco: Never Used  . Alcohol Use: Yes     Comment: rare  . Drug Use: Yes  . Sexual Activity: Not on file   Other Topics Concern  . Not on file   Social History Narrative   Lives w/  husband               Medication List       This list is accurate as of: 10/06/15 11:59 PM.  Always use your most recent med list.               albuterol 108 (90 BASE) MCG/ACT inhaler  Commonly known as:  VENTOLIN HFA  Inhale 2 puffs into the lungs 4 (four) times daily as needed.     famotidine 20 MG tablet  Commonly known as:  PEPCID  Take 20 mg by mouth as needed for heartburn or indigestion.     fluticasone 50 MCG/ACT nasal spray  Commonly known as:  FLONASE  USE 2 SPRAYS ON EACH SIDE OF THE NOSE ONCE DAILY     hydrochlorothiazide 25 MG tablet  Commonly known as:  HYDRODIURIL  Take 1 tablet (25 mg total) by mouth daily.     ibuprofen 600 MG tablet  Commonly known as:  ADVIL  Take  1 tablet (600 mg total) by mouth every 6 (six) hours as needed.     multivitamin tablet  Take 1 tablet by mouth daily.     Naproxen Sodium 220 MG Caps  Take 1 capsule by mouth as needed.     omeprazole 40 MG capsule  Commonly known as:  PRILOSEC  Take 1 capsule (40 mg total) by mouth daily.     oxyCODONE-acetaminophen 7.5-325 MG tablet  Commonly known as:  PERCOCET  Take 1 tablet by mouth every 4 (four) hours as needed for pain.     valsartan 160 MG tablet  Commonly known as:  DIOVAN  Take 1 tablet (160 mg total) by mouth daily.           Objective:   Physical Exam BP 116/66 mmHg  Pulse 78  Temp(Src) 97.6 F (36.4 C) (Oral)  Ht 5\' 4"  (1.626 m)  Wt 298 lb 8 oz (135.399 kg)  BMI 51.21 kg/m2  SpO2 93% General:   Well developed, well nourished . NAD.  HEENT:  Normocephalic . Face symmetric, atraumatic Neck: No JVD at 45 Lungs:  CTA B Normal respiratory effort, no intercostal retractions, no accessory muscle use. Heart: RRR,  no murmur.  trace pretibial edema bilaterally  Lower extremities:  Calves symmetric and not tender. Feet without edema. Pedal pulses wnl Abdomen:  Not distended, soft, non-tender. No rebound or rigidity.   Skin: Not pale. Not jaundice Neurologic:  alert & oriented X3.  Speech normal, gait appropriate for age and unassisted Psych--  Cognition and judgment appear intact.  Cooperative with normal attention span and concentration.  Behavior appropriate. Very emotional when we talk about her sister, tearful.    Assessment & Plan:    Plan: Patient presents with a number of problems: Fatigue, insomnia, anxiety/depression, edema : PHQ- 9 scored 12-- mild depression [name] scored 7 : average Very mild edema objectively today, no evidence of low. Recent labs 7-16 normal. Recommend treatment of anxiety, depression and insomnia and reassess when she comes back for her physical next week. At this point she declined to take SSRIs, I gave her  information about our counselors. For edema I recommend a low-salt diet, work on his weight her weight and will reassess when she comes back. She had extensive counseling today regards her anxiety.  Today, I spent more than 25   min with the patient: >50% of the time counseling regards anxiety

## 2015-10-07 ENCOUNTER — Encounter: Payer: Self-pay | Admitting: Internal Medicine

## 2015-10-07 DIAGNOSIS — Z09 Encounter for follow-up examination after completed treatment for conditions other than malignant neoplasm: Secondary | ICD-10-CM | POA: Insufficient documentation

## 2015-10-07 NOTE — Assessment & Plan Note (Signed)
Patient presents with a number of problems: Fatigue, insomnia, anxiety/depression, edema : PHQ- 9 scored 12-- mild depression [name] scored 7 : average Very mild edema objectively today, no evidence of low. Recent labs 7-16 normal. Recommend treatment of anxiety, depression and insomnia and reassess when she comes back for her physical next week. At this point she declined to take SSRIs, I gave her information about our counselors. For edema I recommend a low-salt diet, work on his weight her weight and will reassess when she comes back. She had extensive counseling today regards her anxiety.

## 2015-10-11 ENCOUNTER — Encounter: Payer: Self-pay | Admitting: Internal Medicine

## 2015-10-11 ENCOUNTER — Ambulatory Visit (INDEPENDENT_AMBULATORY_CARE_PROVIDER_SITE_OTHER): Payer: BLUE CROSS/BLUE SHIELD | Admitting: Internal Medicine

## 2015-10-11 VITALS — BP 124/64 | HR 76 | Temp 97.5°F | Ht 64.0 in | Wt 300.1 lb

## 2015-10-11 DIAGNOSIS — E119 Type 2 diabetes mellitus without complications: Secondary | ICD-10-CM | POA: Diagnosis not present

## 2015-10-11 DIAGNOSIS — Z09 Encounter for follow-up examination after completed treatment for conditions other than malignant neoplasm: Secondary | ICD-10-CM

## 2015-10-11 DIAGNOSIS — Z1159 Encounter for screening for other viral diseases: Secondary | ICD-10-CM

## 2015-10-11 DIAGNOSIS — Z0000 Encounter for general adult medical examination without abnormal findings: Secondary | ICD-10-CM

## 2015-10-11 LAB — HEMOGLOBIN A1C: Hgb A1c MFr Bld: 6 % (ref 4.6–6.5)

## 2015-10-11 MED ORDER — OMEPRAZOLE 40 MG PO CPDR: 40.0000 mg | DELAYED_RELEASE_CAPSULE | Freq: Every day | ORAL | Status: DC

## 2015-10-11 NOTE — Assessment & Plan Note (Addendum)
Tdap @ work 2012 per pt ; pnm 23 -- 2013, prevnar-- 2016;  Flu shot at work  Female care per gynecology  CCS: ifob vs scope vs cologuard discussed, plans to contact GI directly   Diet-exercise discussed ---> recommend to talk to a nutritionist, information provided. In the past we discussed oral medications for weight loss and she is concerned about side effects.  Recommend to think about [place] reviewed

## 2015-10-11 NOTE — Patient Instructions (Signed)
Get your blood work before you leave   If you are interested in talking to a nutritionist [facility] contact Ms. Clarise Cruz at [phone] or  [phone]   Think about a medication called SAXENDA  for weight loss Next visit  for a  routine checkup in 6 months, no fasting Please schedule an appointment at the front desk

## 2015-10-11 NOTE — Progress Notes (Signed)
Subjective:    Patient ID: Rachel Norris, female    DOB: May 07, 1961, 54 y.o.   MRN: [id number]  DOS:  10/11/2015 Type of visit - description : cpx Interval history: was seen last week w/ multiple symptoms, overall a little better, less fatigue, less anxious. Still reports some swelling.     Review of Systems Constitutional: No fever. No chills. No unexplained wt changes. No unusual sweats  HEENT: No dental problems, no ear discharge, no facial swelling, no voice changes. No eye discharge, no eye  redness , no  intolerance to light   Respiratory: No wheezing , no  difficulty breathing. No cough , no mucus production  Cardiovascular: No CP, no  Palpitations  GI: no nausea, no vomiting, no diarrhea , no  abdominal pain.  No blood in the stools. No dysphagia, no odynophagia    Endocrine: No polyphagia, no polyuria , no polydipsia  GU: No dysuria, gross hematuria, difficulty urinating. No urinary urgency, no frequency.  Musculoskeletal:  Long history of bilateral foot pain. Skin: No change in the color of the skin, palor , no  Rash  Allergic, immunologic: No environmental allergies , no  food allergies  Neurological: No dizziness no  syncope. No headaches. No diplopia, no slurred, no slurred speech, no motor deficits, no facial  Numbness  Hematological: No enlarged lymph nodes, no easy bruising , no unusual bleedings  Psychiatry: No suicidal ideas, no hallucinations, no beavior problems, no confusion.      Past Medical History  Diagnosis Date  . Allergic rhinitis   . Anxiety and depression   . Hypertension   . Perimenopausal   . PONV (postoperative nausea and vomiting)   . Prediabetes   . Asthma     Dx 2009  . Wears glasses   . GERD (gastroesophageal reflux disease)   . Irregular menstrual bleeding   . Labial cyst     bilateral icclusion  . Thickened endometrium   . At risk for sleep apnea     STOP-BANG= 5            SENT TO PCP 04-05-2015    Past  Surgical History  Procedure Laterality Date  . Laser ablation of the cervix  1992    DYSPLAGIA  . 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 Hysteroscopy w/d&c N/A 04/08/2015    Procedure: DILATATION AND CURETTAGE /HYSTEROSCOPY, removal of bilateral labial inclusion cysts;  Surgeon: Linda Hedges, DO;  Location: [place];  Service: Gynecology;  Laterality: N/A;  . Ear cyst excision Bilateral 04/08/2015    Procedure: removal of bilateral labial inclusion cysts;  Surgeon: Linda Hedges, DO;  Location: [place];  Service: Gynecology;  Laterality: Bilateral;  . Dilation and curettage of uterus  2016    Social History   Social History  . Marital Status: Married    Spouse Name: N/A  . Number of Children: 0  . 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 Smoking status: Former Smoker -- 1.00 packs/day for 20 years    Types: Cigarettes    Quit date: 04/04/1996  . Smokeless tobacco: Never Used  . Alcohol Use: Yes     Comment: rare  . Drug Use: Yes  . Sexual Activity: Not on file   Other Topics Concern  .  Not on file   Social History Narrative   Lives w/ husband            Family History  Problem Relation Age of Onset  . Diabetes Mother     M anmd others  . Colon cancer Neg Hx   . Breast cancer Neg Hx   . Dementia Mother   . Stroke Father     F and M  . Coronary artery disease Neg Hx   . AAA (abdominal aortic aneurysm) Father       Medication List       This list is accurate as of: 10/11/15  5:30 PM.  Always use your most recent med list.               albuterol 108 (90 BASE) MCG/ACT inhaler  Commonly known as:  VENTOLIN HFA  Inhale 2 puffs into the lungs 4 (four) times daily as needed.     fluticasone 50 MCG/ACT nasal spray  Commonly known as:  FLONASE    USE 2 SPRAYS ON EACH SIDE OF THE NOSE ONCE DAILY     hydrochlorothiazide 25 MG tablet  Commonly known as:  HYDRODIURIL  Take 1 tablet (25 mg total) by mouth daily.     ibuprofen 600 MG tablet  Commonly known as:  ADVIL  Take 1 tablet (600 mg total) by mouth every 6 (six) hours as needed.     multivitamin tablet  Take 1 tablet by mouth daily.     Naproxen Sodium 220 MG Caps  Take 1 capsule by mouth as needed.     omeprazole 40 MG capsule  Commonly known as:  PRILOSEC  Take 1 capsule (40 mg total) by mouth daily.     valsartan 160 MG tablet  Commonly known as:  DIOVAN  Take 1 tablet (160 mg total) by mouth daily.           Objective:   Physical Exam BP 124/64 mmHg  Pulse 76  Temp(Src) 97.5 F (36.4 C) (Oral)  Ht 5\' 4"  (1.626 m)  Wt 300 lb 2 oz (136.136 kg)  BMI 51.49 kg/m2  SpO2 95% General:   Well developed, well nourished . NAD.  HEENT:  Normocephalic . Face symmetric, atraumatic Neck: No thyromegaly Lungs:  CTA B Normal respiratory effort, no intercostal retractions, no accessory muscle use. Heart: RRR,  no murmur.  No pretibial edema bilaterally  MSK: Feet without deformities, normal arch, range of motion of ankles is slightly decreased Skin: Not pale. Not jaundice Neurologic:  alert & oriented X3.  Speech normal, gait appropriate for age and unassisted Psych--  Cognition and judgment appear intact.  Cooperative with normal attention span and concentration.  Behavior appropriate. No anxious or depressed appearing.      Assessment & Plan:   Assessment>  DM HTN Asthma Anxiety, depression Allergic rhinitis Dyspepsia (prn omeprazole)  Plan  Fatigue, insomnia, anxiety, depression, edema: See previous office visit note, overall feels somewhat better. Labs were unremarkable Feet  pain: Likely related to her BMI,previously has seen other providers including orthopedic surgery and was recommended weight loss. I recommend the same DM- check A1c RTC  6 months

## 2015-10-11 NOTE — Assessment & Plan Note (Signed)
Fatigue, insomnia, anxiety, depression, edema: See previous office visit note, overall feels somewhat better. Labs were unremarkable Feet  pain: Likely related to her BMI,previously has seen other providers including orthopedic surgery and was recommended weight loss. I recommend the same DM- check A1c RTC 6 months

## 2015-10-11 NOTE — Progress Notes (Signed)
Pre visit review using our clinic review tool, if applicable. No additional management support is needed unless otherwise documented below in the visit note. 

## 2015-10-12 LAB — HEPATITIS C ANTIBODY: HCV AB: NEGATIVE

## 2015-11-01 ENCOUNTER — Other Ambulatory Visit: Payer: Self-pay

## 2015-11-01 ENCOUNTER — Other Ambulatory Visit (HOSPITAL_BASED_OUTPATIENT_CLINIC_OR_DEPARTMENT_OTHER): Payer: Self-pay | Admitting: Obstetrics & Gynecology

## 2015-11-01 DIAGNOSIS — Z1231 Encounter for screening mammogram for malignant neoplasm of breast: Secondary | ICD-10-CM

## 2015-11-07 ENCOUNTER — Ambulatory Visit (HOSPITAL_BASED_OUTPATIENT_CLINIC_OR_DEPARTMENT_OTHER)
Admission: RE | Admit: 2015-11-07 | Discharge: 2015-11-07 | Disposition: A | Discharge status: Home / Self Care | Payer: BLUE CROSS/BLUE SHIELD | Source: Ambulatory Visit | Attending: Obstetrics & Gynecology | Admitting: Obstetrics & Gynecology

## 2015-11-07 DIAGNOSIS — Z1231 Encounter for screening mammogram for malignant neoplasm of breast: Secondary | ICD-10-CM | POA: Insufficient documentation

## 2015-11-10 ENCOUNTER — Telehealth: Payer: Self-pay | Admitting: Internal Medicine

## 2015-11-10 ENCOUNTER — Encounter: Payer: Self-pay | Admitting: Gastroenterology

## 2015-11-10 DIAGNOSIS — M2550 Pain in unspecified joint: Secondary | ICD-10-CM

## 2015-11-10 NOTE — Telephone Encounter (Signed)
Referral placed.

## 2015-11-10 NOTE — Telephone Encounter (Signed)
Caller name:Gracianna Relationship to patient:self Can be reached:[phone] Pharmacy:  Reason for call:she wants a referral to Rheumatology for joint pain  She wants to see Dr Lenna Gilford

## 2015-11-10 NOTE — Telephone Encounter (Signed)
Okay to arrange

## 2015-11-10 NOTE — Telephone Encounter (Signed)
Please advise 

## 2015-11-11 LAB — HM DIABETES EYE EXAM

## 2015-11-30 LAB — HEPATIC FUNCTION PANEL
ALT: 17 U/L (ref 7–35)
AST: 14 U/L (ref 13–35)
Alkaline Phosphatase: 65 U/L (ref 25–125)
BILIRUBIN, TOTAL: 0.2 mg/dL

## 2015-11-30 LAB — BASIC METABOLIC PANEL
BUN: 17 mg/dL (ref 4–21)
Creatinine: 0.6 mg/dL (ref 0.5–1.1)
GLUCOSE: 105 mg/dL
POTASSIUM: 4.4 mmol/L (ref 3.4–5.3)
Sodium: 139 mmol/L (ref 137–147)

## 2015-12-06 ENCOUNTER — Encounter: Payer: Self-pay | Admitting: Internal Medicine

## 2015-12-13 ENCOUNTER — Encounter: Payer: Self-pay | Admitting: Internal Medicine

## 2015-12-15 ENCOUNTER — Encounter: Payer: Self-pay | Admitting: Internal Medicine

## 2015-12-19 ENCOUNTER — Telehealth: Payer: Self-pay | Admitting: *Deleted

## 2015-12-19 NOTE — Telephone Encounter (Signed)
Ok to schedule as a direct colonoscopy at [facility] during my scheduled block time. Thanks

## 2015-12-19 NOTE — Telephone Encounter (Signed)
Dr Silverio Decamp, This pt is a direct colon for screening. She has a weight of 300.0lbs and a BMI of 51.6.  She has a hx of DM ( no meds) , HTN, asthma, gerd. Do you want her to be a direct colon at [facility] or do you want her to have an OV prior?  Please advise and thanks for your time Marijean Niemann

## 2015-12-19 NOTE — Telephone Encounter (Signed)
Will route to [facility]  to schedule for hospital  as per Dr Silverio Decamp below  Thanks [name] PV

## 2015-12-20 NOTE — Telephone Encounter (Signed)
Pt returned call and instructed due to BMI she needs to have her colon done at [facility].  She states that since she is coming for a colon, she wants to have an EGD as she is choking on liquids and coughs and occ it feels like her solids get hung in her throat for a while and she believes she needs an EGD.  Instructed her she will need an OV for this. Pt requests a Friday. Gave pt an OV 02-24-16 at 1030 am with Dr Silverio Decamp to discuss her egd request. Placed note that Dr Silverio Decamp had previously ok'd her direct [facility] colon due  to her BMI of 51.6.  [name] PV

## 2015-12-20 NOTE — Telephone Encounter (Signed)
[place] at 1134 am .. [name] PV

## 2015-12-20 NOTE — Telephone Encounter (Signed)
She will have to be scheduled after February 24, 2016. That schedule is not out yet. The upcoming Tuesday ( 01/17/16) block is full. Other option is to schedule during the hospital week in February.

## 2015-12-20 NOTE — Telephone Encounter (Signed)
Lets wait for the schedule . Thanks

## 2016-01-04 ENCOUNTER — Ambulatory Visit (INDEPENDENT_AMBULATORY_CARE_PROVIDER_SITE_OTHER): Payer: BLUE CROSS/BLUE SHIELD | Admitting: Internal Medicine

## 2016-01-04 ENCOUNTER — Encounter: Payer: Self-pay | Admitting: Internal Medicine

## 2016-01-04 VITALS — BP 124/68 | HR 87 | Temp 98.7°F | Ht 64.0 in | Wt 291.0 lb

## 2016-01-04 DIAGNOSIS — J4521 Mild intermittent asthma with (acute) exacerbation: Secondary | ICD-10-CM | POA: Diagnosis not present

## 2016-01-04 MED ORDER — PREDNISONE 10 MG PO TABS: ORAL_TABLET | ORAL | Status: DC

## 2016-01-04 MED ORDER — AZITHROMYCIN 250 MG PO TABS: ORAL_TABLET | ORAL | Status: DC

## 2016-01-04 MED ORDER — ALBUTEROL SULFATE HFA 108 (90 BASE) MCG/ACT IN AERS: 2.0000 | INHALATION_SPRAY | Freq: Four times a day (QID) | RESPIRATORY_TRACT | Status: DC | PRN

## 2016-01-04 MED ORDER — AZELASTINE HCL 0.1 % NA SOLN: 2.0000 | Freq: Every evening | NASAL | Status: DC | PRN

## 2016-01-04 NOTE — Progress Notes (Signed)
Subjective:    Patient ID: Rachel Norris, female    DOB: 09/28/1961, 55 y.o.   MRN: [id number]  DOS:  01/04/2016 Type of visit - description : Acute visit Interval history: Symptoms started 4 days ago: Dry cough, sinus congestion, bilateral facial pain, ear pain. Cough is now productive , +sputum, occasionally has nausea and vomited a couple of times due to intense cough   Review of Systems No fever, + chills. Mild sore throat Very little nasal discharge No chest pain or difficulty breathing Asthma is definitely exacerbated with increased wheezing and rhonchi  Past Medical History  Diagnosis Date  . Allergic rhinitis   . Anxiety and depression   . Hypertension   . Perimenopausal   . PONV (postoperative nausea and vomiting)   . Prediabetes   . Asthma     Dx 2009  . Wears glasses   . GERD (gastroesophageal reflux disease)   . Irregular menstrual bleeding   . Labial cyst     bilateral icclusion  . Thickened endometrium   . At risk for sleep apnea     STOP-BANG= 5            SENT TO PCP 04-05-2015    Past Surgical History  Procedure Laterality Date  . Laser ablation of the cervix  1992    DYSPLAGIA  . 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 Hysteroscopy w/d&c N/A 04/08/2015    Procedure: DILATATION AND CURETTAGE /HYSTEROSCOPY, removal of bilateral labial inclusion cysts;  Surgeon: Linda Hedges, DO;  Location: [facility];  Service: Gynecology;  Laterality: N/A;  . Ear cyst excision Bilateral 04/08/2015    Procedure: removal of bilateral labial inclusion cysts;  Surgeon: Linda Hedges, DO;  Location: [facility];  Service: Gynecology;  Laterality: Bilateral;  . Dilation and curettage of uterus  2016    Social History   Social History  . Marital Status: Married    Spouse Name: N/A  . Number of Children: 0  . Years of  Education: N/A   Occupational History  . scrub at the OR in [place]    Social History Main Topics  . Smoking status: Former Smoker -- 1.00 packs/day for 20 years    Types: Cigarettes    Quit date: 04/04/1996  . Smokeless tobacco: Never Used  . Alcohol Use: Yes     Comment: rare  . Drug Use: Yes  . Sexual Activity: Not on file   Other Topics Concern  . Not on file   Social History Narrative   Lives w/ husband               Medication List       This list is accurate as of: 01/04/16  6:17 PM.  Always use your most recent med list.               albuterol 108 (90 Base) MCG/ACT inhaler  Commonly known as:  VENTOLIN HFA  Inhale 2 puffs into the lungs 4 (four) times daily as needed.     azelastine 0.1 % nasal spray  Commonly known as:  ASTELIN  Place 2 sprays into both nostrils at bedtime as needed for rhinitis. Use in each nostril as directed     azithromycin 250 MG tablet  Commonly known as:  ZITHROMAX Z-PAK  2 tabs a day the first day, then 1 tab a  day x 4 days     fluticasone 50 MCG/ACT nasal spray  Commonly known as:  FLONASE  USE 2 SPRAYS ON EACH SIDE OF THE NOSE ONCE DAILY     hydrochlorothiazide 25 MG tablet  Commonly known as:  HYDRODIURIL  Take 1 tablet (25 mg total) by mouth daily.     ibuprofen 600 MG tablet  Commonly known as:  ADVIL  Take 1 tablet (600 mg total) by mouth every 6 (six) hours as needed.     multivitamin tablet  Take 1 tablet by mouth daily.     Naproxen Sodium 220 MG Caps  Take 1 capsule by mouth as needed. Reported on 01/04/2016     omeprazole 40 MG capsule  Commonly known as:  PRILOSEC  Take 1 capsule (40 mg total) by mouth daily.     predniSONE 10 MG tablet  Commonly known as:  DELTASONE  3 tabs x 2 days, 2 tabs x 2 days, 1 tab x 2 days     valsartan 160 MG tablet  Commonly known as:  DIOVAN  Take 1 tablet (160 mg total) by mouth daily.           Objective:   Physical Exam BP 124/68 mmHg  Pulse 87  Temp(Src) 98.7 F  (37.1 C) (Oral)  Ht 5\' 4"  (1.626 m)  Wt 291 lb (131.997 kg)  BMI 49.93 kg/m2  SpO2 97% General:   Well developed, well nourished . NAD.  HEENT:  Normocephalic . Face symmetric, atraumatic. Nose congested, sinuses no TTP, throat symmetric, no red. TMs: Bulge bilaterally, worse on the right, no redness no discharge Lungs:  + Rhonchi bilaterally and wheezing (mild). No rales. Normal respiratory effort, no intercostal retractions, no accessory muscle use. Heart: RRR,  no murmur.  No pretibial edema bilaterally  Skin: Not pale. Not jaundice Neurologic:  alert & oriented X3.  Speech normal, gait appropriate for age and unassisted Psych--  Cognition and judgment appear intact.  Cooperative with normal attention span and concentration.  Behavior appropriate. No anxious or depressed appearing.      Assessment & Plan:   Assessment>  DM HTN Asthma Anxiety, depression Allergic rhinitis Dyspepsia (prn omeprazole)  Plan  Asthma exacerbation likely triggered by bronchitis/URI. Plan: Prednisone, inhalers as needed, Mucinex, Flonase, Astelin, Z-Pak. Call if not better RTC 4 months for a routine checkup.

## 2016-01-04 NOTE — Progress Notes (Signed)
Pre visit review using our clinic review tool, if applicable. No additional management support is needed unless otherwise documented below in the visit note. 

## 2016-01-04 NOTE — Patient Instructions (Addendum)
Rest, fluids , tylenol  For cough:  Take Mucinex DM twice a day as needed until better  For wheezing: Use albuterol as needed  For nasal congestion: Use OTC Nasocort or Flonase : 2 nasal sprays on each side of the nose in the morning until you feel better Use ASTELIN a prescribed spray : 2 nasal sprays on each side of the nose at night until you feel better   Avoid decongestants such as  Pseudoephedrine or phenylephrine    Take the antibiotic as prescribed  (zithromax ) and prednisone  Call if not gradually better over the next  10 days  Call anytime if the symptoms are severe

## 2016-01-10 ENCOUNTER — Encounter: Payer: BLUE CROSS/BLUE SHIELD | Admitting: Gastroenterology

## 2016-02-24 ENCOUNTER — Ambulatory Visit (INDEPENDENT_AMBULATORY_CARE_PROVIDER_SITE_OTHER): Payer: BLUE CROSS/BLUE SHIELD | Admitting: Gastroenterology

## 2016-02-24 ENCOUNTER — Encounter: Payer: Self-pay | Admitting: Gastroenterology

## 2016-02-24 VITALS — BP 128/80 | HR 72 | Ht 63.0 in | Wt 295.0 lb

## 2016-02-24 DIAGNOSIS — K219 Gastro-esophageal reflux disease without esophagitis: Secondary | ICD-10-CM

## 2016-02-24 DIAGNOSIS — IMO0002 Reserved for concepts with insufficient information to code with codable children: Secondary | ICD-10-CM

## 2016-02-24 DIAGNOSIS — Z1211 Encounter for screening for malignant neoplasm of colon: Secondary | ICD-10-CM

## 2016-02-24 MED ORDER — NA SULFATE-K SULFATE-MG SULF 17.5-3.13-1.6 GM/177ML PO SOLN: 1.0000 | Freq: Once | ORAL | Status: DC

## 2016-02-24 NOTE — Patient Instructions (Signed)
We will send in your omeprazole to your pharmacy Your hospital procedure has been scheduled separate instructions have been given

## 2016-02-27 NOTE — Progress Notes (Signed)
Rachel Norris    [id number]    1961/01/04  Primary Care Physician:Jose Larose Kells, MD  Referring Physician: Colon Branch, MD [address] [place], [place] [address]  Chief complaint:  Occasional choking with liquids and heartburn  HPI: 55 year old female with obesity here for new patient visit for evaluation of occasional choking for liquids. She has no difficulty swallowing solids. No history of food impactions. Complaints of intermittent heartburn and regurgitation. Her weight has been progressively increasing, she is trying to lose weight. She never had colonoscopy for screening for colorectal cancer. No family history of colon cancer. Denies any change of bowel habits or blood per rectum.   Outpatient Encounter Prescriptions as of 02/24/2016  Medication Sig  . aspirin 81 MG tablet Take 81 mg by mouth daily.  . cetirizine (ALL DAY ALLERGY) 10 MG tablet Take 10 mg by mouth as needed for allergies.  . fluticasone (FLONASE) 50 MCG/ACT nasal spray USE 2 SPRAYS ON EACH SIDE OF THE NOSE ONCE DAILY (Patient taking differently: USE 2 SPRAYS ON EACH SIDE OF THE NOSE AS NEEDED)  . hydrochlorothiazide (HYDRODIURIL) 25 MG tablet Take 1 tablet (25 mg total) by mouth daily.  . Multiple Vitamin (MULTIVITAMIN) tablet Take 1 tablet by mouth as needed.   . Naproxen Sodium 220 MG CAPS Take 2 capsules by mouth as needed. Reported on 01/04/2016  . omeprazole (PRILOSEC) 40 MG capsule Take 40 mg by mouth as needed.  . valsartan (DIOVAN) 160 MG tablet Take 1 tablet (160 mg total) by mouth daily.  . [DISCONTINUED] omeprazole (PRILOSEC) 40 MG capsule Take 1 capsule (40 mg total) by mouth daily. (Patient taking differently: Take 40 mg by mouth as needed. )  . Na Sulfate-K Sulfate-Mg Sulf (SUPREP BOWEL PREP) SOLN Take 1 kit by mouth once.  . [DISCONTINUED] albuterol (VENTOLIN HFA) 108 (90 Base) MCG/ACT inhaler Inhale 2 puffs into the lungs 4 (four) times daily as needed.  . [DISCONTINUED]  azelastine (ASTELIN) 0.1 % nasal spray Place 2 sprays into both nostrils at bedtime as needed for rhinitis. Use in each nostril as directed  . [DISCONTINUED] azithromycin (ZITHROMAX Z-PAK) 250 MG tablet 2 tabs a day the first day, then 1 tab a day x 4 days  . [DISCONTINUED] ibuprofen (ADVIL,MOTRIN) 600 MG tablet Take 1 tablet (600 mg total) by mouth every 6 (six) hours as needed.  . [DISCONTINUED] predniSONE (DELTASONE) 10 MG tablet 3 tabs x 2 days, 2 tabs x 2 days, 1 tab x 2 days   No facility-administered encounter medications on file as of 02/24/2016.    Allergies as of 02/24/2016 - Review Complete 02/24/2016  Allergen Reaction Noted  . Sulfa antibiotics Hives 04/05/2015    Past Medical History  Diagnosis Date  . Allergic rhinitis   . Anxiety and depression   . Hypertension   . Perimenopausal   . PONV (postoperative nausea and vomiting)   . Prediabetes   . Asthma     Dx 2009  . Wears glasses   . GERD (gastroesophageal reflux disease)   . Irregular menstrual bleeding   . Labial cyst     bilateral icclusion  . Thickened endometrium   . At risk for sleep apnea     STOP-BANG= 5            SENT TO PCP 04-05-2015    Past Surgical History  Procedure Laterality Date  . Laser ablation of the cervix  1992  DYSPLAGIA  . 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 Hysteroscopy w/d&c N/A 04/08/2015    Procedure: DILATATION AND CURETTAGE /HYSTEROSCOPY, removal of bilateral labial inclusion cysts;  Surgeon: Linda Hedges, DO;  Location: [facility];  Service: Gynecology;  Laterality: N/A;  . Ear cyst excision Bilateral 04/08/2015    Procedure: removal of bilateral labial inclusion cysts;  Surgeon: Linda Hedges, DO;  Location: [facility];  Service: Gynecology;  Laterality: Bilateral;    Family History  Problem Relation Age of Onset  . Diabetes  Mother     M anmd others  . Colon cancer Neg Hx   . Breast cancer Neg Hx   . Dementia Mother   . Stroke Father   . Coronary artery disease Neg Hx   . AAA (abdominal aortic aneurysm) Father   . Stroke Mother     Social History   Social History  . Marital Status: Married    Spouse Name: N/A  . Number of Children: 0  . 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 Smoking status: Former Smoker -- 1.00 packs/day for 20 years    Types: Cigarettes    Quit date: 04/04/1996  . Smokeless tobacco: Never Used  . Alcohol Use: 0.0 oz/week    0 Standard drinks or equivalent per week     Comment: rare 6 per year  . Drug Use: No  . Sexual Activity: Not on file   Other Topics Concern  . Not on file   Social History Narrative   Lives w/ husband             Review of systems: Review of Systems  Constitutional: Negative for fever and chills.  HENT: Negative.   Eyes: Negative for blurred vision.  Respiratory: Negative for cough, shortness of breath and wheezing.   Cardiovascular: Negative for chest pain and palpitations.  Gastrointestinal: as per HPI Genitourinary: Negative for dysuria, urgency, frequency and hematuria.  Musculoskeletal: Positive for myalgias, back pain and joint pain.  Skin: Negative for itching and rash.  Neurological: Negative for dizziness, tremors, focal weakness, seizures and loss of consciousness.  Endo/Heme/Allergies: Negative for environmental allergies.  Psychiatric/Behavioral: Negative for depression, suicidal ideas and hallucinations.  All other systems reviewed and are negative.   Physical Exam: Filed Vitals:   02/24/16 1025  BP: 128/80  Pulse: 72   Gen:      No acute distress,Obese HEENT:  EOMI, sclera anicteric Neck:     No masses; no thyromegaly Lungs:    Clear to auscultation bilaterally; normal respiratory effort CV:         Regular rate and rhythm; no murmurs Abd:      +  bowel sounds; soft, non-tender; no palpable masses, no distension Ext:    No edema; adequate peripheral perfusion Skin:      Warm and dry; no rash Neuro: alert and oriented x 3 Psych: normal mood and affect  Data Reviewed: Reviewed chart in epic  Assessment and Plan/Recommendations:  55 year old female with obesity here to establish care Given her intermittent symptoms of heartburn, regurgitation and occasional choking on liquids will do an empiric trial of PPI once a day for 2-3 months Discussed antireflux measures Schedule for screening colonoscopy at the hospital given BMI greater than 50 Return as needed  Damaris Hippo , MD [phone] Mon-Fri 8a-5p [phone] after 5p, weekends, holidays

## 2016-03-26 ENCOUNTER — Ambulatory Visit (INDEPENDENT_AMBULATORY_CARE_PROVIDER_SITE_OTHER): Payer: BLUE CROSS/BLUE SHIELD | Admitting: Obstetrics and Gynecology

## 2016-03-26 ENCOUNTER — Telehealth: Payer: Self-pay | Admitting: Obstetrics and Gynecology

## 2016-03-26 ENCOUNTER — Encounter: Payer: Self-pay | Admitting: Obstetrics and Gynecology

## 2016-03-26 ENCOUNTER — Other Ambulatory Visit: Payer: Self-pay

## 2016-03-26 VITALS — BP 138/88 | HR 80 | Resp 16 | Ht 62.5 in | Wt 298.0 lb

## 2016-03-26 DIAGNOSIS — Z124 Encounter for screening for malignant neoplasm of cervix: Secondary | ICD-10-CM

## 2016-03-26 DIAGNOSIS — E669 Obesity, unspecified: Secondary | ICD-10-CM

## 2016-03-26 DIAGNOSIS — Z01419 Encounter for gynecological examination (general) (routine) without abnormal findings: Secondary | ICD-10-CM

## 2016-03-26 DIAGNOSIS — N939 Abnormal uterine and vaginal bleeding, unspecified: Secondary | ICD-10-CM

## 2016-03-26 DIAGNOSIS — R7303 Prediabetes: Secondary | ICD-10-CM

## 2016-03-26 LAB — CBC WITH DIFFERENTIAL/PLATELET
BASOS ABS: 0.1 10*3/uL (ref 0.0–0.1)
BASOS PCT: 1 % (ref 0–1)
EOS ABS: 0.2 10*3/uL (ref 0.0–0.7)
EOS PCT: 3 % (ref 0–5)
HEMATOCRIT: 40.5 % (ref 36.0–46.0)
Hemoglobin: 13.4 g/dL (ref 12.0–15.0)
LYMPHS PCT: 23 % (ref 12–46)
Lymphs Abs: 1.6 10*3/uL (ref 0.7–4.0)
MCH: 31.4 pg (ref 26.0–34.0)
MCHC: 33.1 g/dL (ref 30.0–36.0)
MCV: 94.8 fL (ref 78.0–100.0)
MPV: 9.6 fL (ref 8.6–12.4)
Monocytes Absolute: 0.6 10*3/uL (ref 0.1–1.0)
Monocytes Relative: 9 % (ref 3–12)
Neutro Abs: 4.5 10*3/uL (ref 1.7–7.7)
Neutrophils Relative %: 64 % (ref 43–77)
PLATELETS: 365 10*3/uL (ref 150–400)
RBC: 4.27 MIL/uL (ref 3.87–5.11)
RDW: 13.4 % (ref 11.5–15.5)
WBC: 7 10*3/uL (ref 4.0–10.5)

## 2016-03-26 LAB — FERRITIN: FERRITIN: 32 ng/mL (ref 10–232)

## 2016-03-26 LAB — TSH: TSH: 2.54 m[IU]/L

## 2016-03-26 NOTE — Addendum Note (Signed)
Addended by: Dorothy Spark on: 03/26/2016 10:59 AM   Modules accepted: Orders

## 2016-03-26 NOTE — Progress Notes (Signed)
Patient ID: Rachel Norris, female   DOB: 12-12-1961, 55 y.o.   MRN: [id number] 55 y.o. G0P0000 MarriedCaucasianF here for annual exam.  Cycles are irregular, q 2-4 weeks x 7-21 days. Saturates a pad in up to an hour. Not sexually active. She is having hot flashes, cold flashes, weepy at times, nightsweats (not every night). She has started growing some random hair on her chin, and has worsening of her dry skin.  She had a hysteroscopic myomectomy in 3/16.  Period Duration (Days): 7-14 days  Period Pattern: (!) Irregular Menstrual Flow: Light Menstrual Control: Thin pad, Maxi pad Dysmenorrhea: None  Patient's last menstrual period was 02/29/2016 (approximate).          Sexually active: No.  The current method of family planning is none.    Exercising: Yes.    The patient has a physically strenuous job, but has no regular exercise apart from work.  Smoker:  Former smoker  Health Maintenance: Pap:  2015 WNL Per patient History of abnormal Pap:  Yes- pre -cancerous cells in the 90's, s/p laser ablation MMG:  11-09-15 WNL Colonoscopy:  Scheduled for 05/15/16 BMD:   Never TDaP:  2012 Gardasil: N/A   reports that she quit smoking about 19 years ago. Her smoking use included Cigarettes. She has a 20 pack-year smoking history. She has never used smokeless tobacco. She reports that she drinks alcohol. 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 Allergic rhinitis   . Anxiety and depression   . Hypertension   . Perimenopausal   . PONV (postoperative nausea and vomiting)   . Prediabetes   . Asthma     Dx 2009  . Wears glasses   . GERD (gastroesophageal reflux disease)   . Irregular menstrual bleeding   . Labial cyst     bilateral icclusion  . Thickened endometrium   . At risk for sleep apnea     STOP-BANG= 5            SENT TO PCP 04-05-2015  . Depression   . Anxiety   . Fibroid   . Hormone disorder     Past  Surgical History  Procedure Laterality Date  . Laser ablation of the cervix  1992    DYSPLAGIA  . 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 Hysteroscopy w/d&c N/A 04/08/2015    Procedure: DILATATION AND CURETTAGE /HYSTEROSCOPY, removal of bilateral labial inclusion cysts;  Surgeon: Linda Hedges, DO;  Location: [facility];  Service: Gynecology;  Laterality: N/A;  . Ear cyst excision Bilateral 04/08/2015    Procedure: removal of bilateral labial inclusion cysts;  Surgeon: Linda Hedges, DO;  Location: [facility];  Service: Gynecology;  Laterality: Bilateral;  . Hysteroscopy    . Dilation and curettage of uterus      Current Outpatient Prescriptions  Medication Sig Dispense Refill  . cetirizine (ALL DAY ALLERGY) 10 MG tablet Take 10 mg by mouth as needed for allergies.    . hydrochlorothiazide (HYDRODIURIL) 25 MG tablet Take 1 tablet (25 mg total) by mouth daily. 90 tablet 2  . Multiple Vitamin (MULTIVITAMIN) tablet Take 1 tablet by mouth as needed.     . Naproxen Sodium 220 MG CAPS Take 2 capsules by mouth as needed. Reported on 01/04/2016    .  omeprazole (PRILOSEC) 40 MG capsule Take 40 mg by mouth as needed.    . valsartan (DIOVAN) 160 MG tablet Take 1 tablet (160 mg total) by mouth daily. 90 tablet 2  . Na Sulfate-K Sulfate-Mg Sulf (SUPREP BOWEL PREP) SOLN Take 1 kit by mouth once. (Patient not taking: Reported on 03/26/2016) 1 Bottle 0   No current facility-administered medications for this visit.    Family History  Problem Relation Age of Onset  . Diabetes Mother     M anmd others  . Colon cancer Neg Hx   . Breast cancer Neg Hx   . Dementia Mother   . Stroke Father   . Coronary artery disease Neg Hx   . AAA (abdominal aortic aneurysm) Father   . Stroke Mother     Review of Systems  Constitutional: Negative.   HENT: Negative.   Eyes:  Negative.   Respiratory: Negative.   Cardiovascular: Negative.   Gastrointestinal: Negative.   Endocrine: Negative.   Genitourinary: Positive for menstrual problem.       Irregular menstrual cycle  Musculoskeletal: Negative.   Skin: Negative.   Allergic/Immunologic: Negative.   Neurological: Negative.   Psychiatric/Behavioral: Negative.     Exam:   BP 138/88 mmHg  Pulse 80  Resp 16  Ht 5' 2.5" (1.588 m)  Wt 298 lb (135.172 kg)  BMI 53.60 kg/m2  LMP 02/29/2016 (Approximate)  Weight change: @WEIGHTCHANGE @ Height:   Height: 5' 2.5" (158.8 cm)  Ht Readings from Last 3 Encounters:  03/26/16 5' 2.5" (1.588 m)  02/24/16 5' 3"  (1.6 m)  01/04/16 5' 4"  (1.626 m)    General appearance: alert, cooperative and appears stated age Head: Normocephalic, without obvious abnormality, atraumatic Neck: no adenopathy, supple, symmetrical, trachea midline and thyroid normal to inspection and palpation Lungs: clear to auscultation bilaterally Breasts: normal appearance, no masses or tenderness Heart: regular rate and rhythm Abdomen: soft, non-tender; bowel sounds normal; no masses,  no organomegaly Extremities: extremities normal, atraumatic, no cyanosis or edema Skin: Skin color, texture, turgor normal. No rashes or lesions Lymph nodes: Cervical, supraclavicular, and axillary nodes normal. No abnormal inguinal nodes palpated Neurologic: Grossly normal  She has a long cord like feeling lump in her left upper inner thigh. She thinks it has been there for a while, not tender, not sure if changing in size.   Pelvic: External genitalia:  no lesions              Urethra:  normal appearing urethra with no masses, tenderness or lesions              Bartholins and Skenes: normal                 Vagina: normal appearing vagina with normal color and discharge, no lesions              Cervix: no lesions               Bimanual Exam:  Uterus:  normal size, contour, position, consistency, mobility,  non-tender              Adnexa: no mass, fullness, tenderness               Rectovaginal: Confirms               Anus:  normal sphincter tone, no lesions  Chaperone was present for exam.  A:  Well Woman with normal exam  Abnormal uterine bleeding  Obesity  Palpable cord in the  left upper thigh, she had imaging after a knee injury in the last few months, will get a copy of report to see if that area was imaged.   P:   Pap with hpv  CBC, ferritin, TSH  Return for a sonohysterogram,possible endometrial biopsy  Discussed exercise and weight watchers or a nutrition consult  Mammogram UTD  Colonoscopy scheduled  Labs and immunizations with primary

## 2016-03-26 NOTE — Telephone Encounter (Signed)
Called patient to review benefits for a recommended procedure. Left Voicemail requesting a call back. °

## 2016-03-26 NOTE — Patient Instructions (Signed)

## 2016-03-27 NOTE — Telephone Encounter (Signed)
Provided benefits for recommended procedure. Patient advises she wants to talk with insurance company before scheduling

## 2016-03-27 NOTE — Telephone Encounter (Signed)
Patient returning call.

## 2016-03-28 LAB — IPS PAP TEST WITH HPV

## 2016-03-28 NOTE — Telephone Encounter (Signed)
Spoke with pt regarding benefit for sonohysterogram. Patient understood and agreeable. Patient ready to schedule. Patient scheduled 04/10/16 with Dr Talbert Nan. Pt aware of arrival date and time. Pt aware of 72 hours cancellation policy with [id number] fee. Pt had additional questions regarding procedure. Call routed to Triage/Kaitlyn

## 2016-03-28 NOTE — Telephone Encounter (Signed)
Spoke with patient. Patient would like to know why she needs to have a PUS. Advised due to irregular bleeding she has been experiencing it is recommended she have further evaluation with PUS to assess her uterus for any potential cause of irregular bleeding. She is agreeable and verbalizes understanding. She will keep her appointment as scheduled for 04/10/2016 with Dr.Jertson.  Routing to provider for final review. Patient agreeable to disposition. Will close encounter.

## 2016-04-07 ENCOUNTER — Other Ambulatory Visit: Payer: Self-pay | Admitting: Internal Medicine

## 2016-04-09 ENCOUNTER — Telehealth: Payer: Self-pay

## 2016-04-09 NOTE — Telephone Encounter (Signed)
Spoke with patient. Phone call passed from insurance and billing department. Patient is scheduled for a [facility] tomorrow 04/10/2016 at 1 pm with Dr.Jertson .States that she started to have light bleeding on 03/29/2016 and has been having intermittent bleeding since. Reports she is experiencing light spotting today and is asking if she may proceed with her [facility] tomorrow. Advised she may proceed with having her [facility] tomorrow as scheduled. She is concerned about this effecting her imaging. Advised I will speak with Dr.Jertson and if she has any additional recommendations I will return call. She is agreeable.  Dr.Jertson, it is okay for this patient to keep her appointment as scheduled?

## 2016-04-09 NOTE — Telephone Encounter (Signed)
Spoke with patient. Advised of message as seen below from [place]. She is agreeable and will keep her appointment as scheduled.  Routing to provider for final review. Patient agreeable to disposition. Will close encounter.

## 2016-04-09 NOTE — Telephone Encounter (Signed)
Her bleeding is so irregular it will be hard to time with her cycle. As long as she isn't bleeding heavily I would proceed. Thanks!

## 2016-04-10 ENCOUNTER — Encounter: Payer: Self-pay | Admitting: Obstetrics and Gynecology

## 2016-04-10 ENCOUNTER — Ambulatory Visit (INDEPENDENT_AMBULATORY_CARE_PROVIDER_SITE_OTHER): Payer: BLUE CROSS/BLUE SHIELD | Admitting: Obstetrics and Gynecology

## 2016-04-10 ENCOUNTER — Other Ambulatory Visit: Payer: Self-pay | Admitting: Obstetrics and Gynecology

## 2016-04-10 ENCOUNTER — Ambulatory Visit (INDEPENDENT_AMBULATORY_CARE_PROVIDER_SITE_OTHER): Payer: BLUE CROSS/BLUE SHIELD

## 2016-04-10 VITALS — BP 140/86 | HR 70 | Ht 62.5 in | Wt 293.8 lb

## 2016-04-10 DIAGNOSIS — N951 Menopausal and female climacteric states: Secondary | ICD-10-CM | POA: Diagnosis not present

## 2016-04-10 DIAGNOSIS — N939 Abnormal uterine and vaginal bleeding, unspecified: Secondary | ICD-10-CM

## 2016-04-10 DIAGNOSIS — R61 Generalized hyperhidrosis: Secondary | ICD-10-CM

## 2016-04-10 DIAGNOSIS — R232 Flushing: Secondary | ICD-10-CM

## 2016-04-10 DIAGNOSIS — D259 Leiomyoma of uterus, unspecified: Secondary | ICD-10-CM | POA: Diagnosis not present

## 2016-04-10 NOTE — Progress Notes (Signed)
GYNECOLOGY  VISIT   HPI: 55 y.o.   Married  Caucasian  female   [id number] with Patient's last menstrual period was 03/29/2016.   here for evaluation for abnormal uterine bleeding. She is having hot flashes and night sweats.  She has a h/o a hysteroscopy about a year ago with removal of a polyp or fibroid (not sure). Recent normal TSH, CBC and pap.  She has a h/o mild diabetes, HgbA1C of 6 in the fall. Not on any medication.   GYNECOLOGIC HISTORY: Patient's last menstrual period was 03/29/2016. Contraception: not sexually active Menopausal hormone therapy: none        OB History    Gravida Para Term Preterm AB TAB SAB Ectopic Multiple Living   0 0 0 0 0 0 0 0 0 0          Patient Active Problem List   Diagnosis Date Noted  . PCP NOTES >>> 10/07/2015  . Fatigue 07/12/2015  . Diabetes ([place]) 07/27/2013  . Annual physical exam 04/24/2012  . Pain in both feet 03/14/2012  . Nail lesion 03/14/2012  . EXOGENOUS OBESITY 08/29/2009  . Asthma 06/22/2008  . ANXIETY 03/07/2007  . Essential hypertension 03/07/2007  . ALLERGIC RHINITIS 03/07/2007    Past Medical History  Diagnosis Date  . Allergic rhinitis   . Anxiety and depression   . Hypertension   . Perimenopausal   . PONV (postoperative nausea and vomiting)   . Prediabetes   . Asthma     Dx 2009  . Wears glasses   . GERD (gastroesophageal reflux disease)   . Irregular menstrual bleeding   . Labial cyst     bilateral icclusion  . Thickened endometrium   . At risk for sleep apnea     STOP-BANG= 5            SENT TO PCP 04-05-2015  . Depression   . Anxiety   . Fibroid   . Hormone disorder     Past Surgical History  Procedure Laterality Date  . Laser ablation of the cervix  1992    DYSPLAGIA  . 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 Hysteroscopy w/d&c N/A 04/08/2015    Procedure: DILATATION AND CURETTAGE  /HYSTEROSCOPY, removal of bilateral labial inclusion cysts;  Surgeon: Linda Hedges, DO;  Location: [place];  Service: Gynecology;  Laterality: N/A;  . Ear cyst excision Bilateral 04/08/2015    Procedure: removal of bilateral labial inclusion cysts;  Surgeon: Linda Hedges, DO;  Location: [place];  Service: Gynecology;  Laterality: Bilateral;  . Hysteroscopy    . Dilation and curettage of uterus      Current Outpatient Prescriptions  Medication Sig Dispense Refill  . albuterol (VENTOLIN HFA) 108 (90 Base) MCG/ACT inhaler Inhale 1 puff into the lungs as needed.    . cetirizine (ALL DAY ALLERGY) 10 MG tablet Take 10 mg by mouth as needed for allergies.    . cetirizine (ZYRTEC) 10 MG tablet Take 1 tablet by mouth daily.    . hydrochlorothiazide (HYDRODIURIL) 25 MG tablet Take 1 tablet (25 mg total) by mouth daily. 90 tablet 1  . Multiple Vitamin (MULTIVITAMIN) tablet Take 1 tablet by mouth as needed.     . Na Sulfate-K Sulfate-Mg Sulf (SUPREP BOWEL PREP) SOLN Take 1 kit by mouth once. 1 Bottle 0  . Naproxen Sodium 220 MG CAPS Take 2  capsules by mouth as needed. Reported on 01/04/2016    . omeprazole (PRILOSEC) 40 MG capsule Take 40 mg by mouth as needed.    . valsartan (DIOVAN) 160 MG tablet Take 1 tablet (160 mg total) by mouth daily. 90 tablet 1   No current facility-administered medications for this visit.     ALLERGIES: Sulfa antibiotics  Family History  Problem Relation Age of Onset  . Diabetes Mother     M anmd others  . Colon cancer Neg Hx   . Breast cancer Neg Hx   . Dementia Mother   . Stroke Father   . Coronary artery disease Neg Hx   . AAA (abdominal aortic aneurysm) Father   . Stroke Mother     Social History   Social History  . Marital Status: Married    Spouse Name: N/A  . Number of Children: 0  . 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 Smoking status: Former  Smoker -- 1.00 packs/day for 20 years    Types: Cigarettes    Quit date: 04/04/1996  . Smokeless tobacco: Never Used  . Alcohol Use: 0.0 oz/week    0 Standard drinks or equivalent per week     Comment: rare 6 per year  . Drug Use: No  . Sexual Activity: No   Other Topics Concern  . Not on file   Social History Narrative   Lives w/ husband           ROS  PHYSICAL EXAMINATION:    BP 140/86 mmHg  Pulse 70  Ht 5' 2.5" (1.588 m)  Wt 293 lb 12.8 oz (133.267 kg)  BMI 52.85 kg/m2  LMP 03/29/2016    General appearance: alert, cooperative and appears stated age  Pelvic: External genitalia:  no lesions              Urethra:  normal appearing urethra with no masses, tenderness or lesions              Bartholins and Skenes: normal                 Vagina: normal appearing vagina with normal color and discharge, no lesions              Cervix: without lesions  Sonohysterogram The procedure and risks of the procedure were reviewed with the patient, consent form was signed. A speculum was placed in the vagina and the cervix was cleansed with betadine. A tenaculum was placed on the cevix at 12 o'clock. The sonohysterogram catheter was inserted into the uterine cavity without difficulty. Saline was infused under direct observation with the ultrasound. 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 The endometrium was uniformly thin.The catheter was removed.  Please see full ultrasound/sonohysterogram report  The patient tolerated the procedure well   Chaperone was present for exam.  ASSESSMENT Abnormal uterine bleeding. 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 Reviewed the risks, including: bleeding, infection, uterine perforation, need to stop the procedure and fluid overload Will check [facility] and [facility] today Return for a pre-op   An After Visit Summary was printed and given to the  patient.  25 minutes face to face time of which over 50% was spent in counseling.

## 2016-04-11 ENCOUNTER — Telehealth: Payer: Self-pay | Admitting: Obstetrics and Gynecology

## 2016-04-11 LAB — FOLLICLE STIMULATING HORMONE: FSH: 49.4 m[IU]/mL

## 2016-04-11 NOTE — Telephone Encounter (Signed)
Called patient to review benefits for a recommended procedure. Left Voicemail requesting a call back. °

## 2016-04-11 NOTE — Telephone Encounter (Signed)
Spoke with patient regarding benefits for recommended procedure.  Patient states she had a procedure done with another provider last year and she wants to wait until our office has received those results before proceeding with recommended procedure. Patient also advised, due to work restrictions and vacation, she feels she would not be able to move forward with scheduling until May or June. Routing to [place] for review.

## 2016-04-13 LAB — ANTI MULLERIAN HORMONE

## 2016-04-16 ENCOUNTER — Ambulatory Visit: Payer: BLUE CROSS/BLUE SHIELD | Admitting: Internal Medicine

## 2016-04-16 NOTE — Telephone Encounter (Signed)
Dr Talbert Nan, have you received records?

## 2016-04-17 NOTE — Telephone Encounter (Signed)
I only have notes from prior to her sonohysterogram last year. Rachel Norris is trying to get the rest of the records.

## 2016-04-18 ENCOUNTER — Telehealth: Payer: Self-pay

## 2016-04-18 NOTE — Telephone Encounter (Signed)
Left message to call [place] at [phone].  I have spoken with [facility] regarding records received. No additional records are available from their office. Was she seen at another practice for any further evaluation as mentioned to Dr.Jertson at her appointment on 04/10/2016?

## 2016-04-19 NOTE — Telephone Encounter (Signed)
Patient is returning a call to Kaitlyn. °

## 2016-04-19 NOTE — Telephone Encounter (Signed)
Spoke with patient. Patient states that she had her D&C performed at [facility] with Dr.Morris in 03/2015 after being seen at [facility]. States that she signed a release of records while in our office for these records to be sent to [facility] for review. Advised I will notify Dr.Jertson so that she can be on the look out for these records. Advised I will also speak with our front office staff to ensure we receive these records from [facility]. She is agreeable.  Spoke with Alvis Lemmings who will follow up with [facility] regarding requested records and will ensure these are received.   Routing to provider for final review. Patient agreeable to disposition. Will close encounter.

## 2016-05-01 ENCOUNTER — Telehealth: Payer: Self-pay | Admitting: *Deleted

## 2016-05-01 NOTE — Telephone Encounter (Signed)
See next phone encounter. Encounter closed.   

## 2016-05-01 NOTE — Telephone Encounter (Signed)
See previous phone encounter regarding patient preference to delay surgery. Call to patient. Left message to call back.

## 2016-05-02 NOTE — Telephone Encounter (Signed)
Return call from patient. States she is interested in Dr Gentry Fitz review of previous records to determine if she should proceed with more aggressive surgical option instead of repeat hysteroscopy. Patient states she does not want to repeat hysteroscopy to not have it solve the problem and be faced with needing to more surgery later. Patient states she continues to have bleeding on most days, only a few days that it stops but does not have time to plan surgery until June or July.  Advised will check on previous records and review with Dr Talbert Nan and call her back.   Op note from 04-08-15 surgery printed from [facility]. [place] request was faxed to [facility] on 04-10-16 and second request on 04-19-16. Will call for update.

## 2016-05-02 NOTE — Telephone Encounter (Signed)
Reviewed the records from her hysteroscopy last year. She had a diagnostic hysteroscopy, D&C with removal of labial cysts. She was noted to have a calcified fibroid filling her cavity. Not removed.  Discussed with the patient that the fibroid is likely causing her abnormal bleeding. Recommend hysteroscopic myomectomy, D&C. The patient had recent lab work that was consistent with menopause. She has never gone more than a month or 2 without bleeding. She does report recent worsening of her vasomotor symptoms. I told her that she is certainly perimenopausal, if she still has abnormal bleeding after the myomectomy a mirena IUD would potentially be an option. I wouldn't recommend a hysterectomy at this time. We discussed potentially using cyclic provera if needed post op to see if she has a w/d bleed.  She would like to plan surgery next month.

## 2016-05-10 ENCOUNTER — Encounter (HOSPITAL_COMMUNITY): Payer: Self-pay | Admitting: *Deleted

## 2016-05-14 ENCOUNTER — Encounter (HOSPITAL_COMMUNITY): Payer: Self-pay | Admitting: Anesthesiology

## 2016-05-14 NOTE — Anesthesia Preprocedure Evaluation (Addendum)
Anesthesia Evaluation  Patient identified by MRN, date of birth, ID band Patient awake    Reviewed: Allergy & Precautions, NPO status , Patient's Chart, lab work & pertinent test results  History of Anesthesia Complications (+) PONV and history of anesthetic complications  Airway Mallampati: II  TM Distance: >3 FB Neck ROM: Full    Dental no notable dental hx.    Pulmonary asthma , former smoker,    Pulmonary exam normal breath sounds clear to auscultation       Cardiovascular hypertension, Pt. on medications Normal cardiovascular exam Rhythm:Regular Rate:Normal     Neuro/Psych PSYCHIATRIC DISORDERS Anxiety Depression negative neurological ROS     GI/Hepatic Neg liver ROS, GERD  Medicated,  Endo/Other  diabetes, Type 2Morbid obesity  Renal/GU negative Renal ROS  negative genitourinary   Musculoskeletal  (+) Arthritis ,   Abdominal (+) + obese,   Peds negative pediatric ROS (+)  Hematology negative hematology ROS (+)   Anesthesia Other Findings   Reproductive/Obstetrics negative OB ROS                            Anesthesia Physical Anesthesia Plan  ASA: III  Anesthesia Plan: MAC   Post-op Pain Management:    Induction: Intravenous  Airway Management Planned: Natural Airway  Additional Equipment:   Intra-op Plan:   Post-operative Plan:   Informed Consent: I have reviewed the patients History and Physical, chart, labs and discussed the procedure including the risks, benefits and alternatives for the proposed anesthesia with the patient or authorized representative who has indicated his/her understanding and acceptance.   Dental advisory given  Plan Discussed with: CRNA  Anesthesia Plan Comments:         Anesthesia Quick Evaluation

## 2016-05-15 ENCOUNTER — Ambulatory Visit (HOSPITAL_COMMUNITY)
Admission: RE | Admit: 2016-05-15 | Discharge: 2016-05-15 | Disposition: A | Discharge status: Home / Self Care | Payer: BLUE CROSS/BLUE SHIELD | Source: Ambulatory Visit | Attending: Gastroenterology | Admitting: Gastroenterology

## 2016-05-15 ENCOUNTER — Ambulatory Visit (HOSPITAL_COMMUNITY): Payer: BLUE CROSS/BLUE SHIELD | Admitting: Anesthesiology

## 2016-05-15 ENCOUNTER — Encounter (HOSPITAL_COMMUNITY): Payer: Self-pay

## 2016-05-15 ENCOUNTER — Encounter (HOSPITAL_COMMUNITY)
Admission: RE | Disposition: A | Discharge status: Home / Self Care | Payer: Self-pay | Source: Ambulatory Visit | Attending: Gastroenterology

## 2016-05-15 DIAGNOSIS — Z6841 Body Mass Index (BMI) 40.0 and over, adult: Secondary | ICD-10-CM | POA: Diagnosis not present

## 2016-05-15 DIAGNOSIS — J45909 Unspecified asthma, uncomplicated: Secondary | ICD-10-CM | POA: Diagnosis not present

## 2016-05-15 DIAGNOSIS — I10 Essential (primary) hypertension: Secondary | ICD-10-CM | POA: Diagnosis not present

## 2016-05-15 DIAGNOSIS — K5730 Diverticulosis of large intestine without perforation or abscess without bleeding: Secondary | ICD-10-CM | POA: Diagnosis not present

## 2016-05-15 DIAGNOSIS — K219 Gastro-esophageal reflux disease without esophagitis: Secondary | ICD-10-CM | POA: Insufficient documentation

## 2016-05-15 DIAGNOSIS — Z1211 Encounter for screening for malignant neoplasm of colon: Secondary | ICD-10-CM | POA: Insufficient documentation

## 2016-05-15 DIAGNOSIS — Z87891 Personal history of nicotine dependence: Secondary | ICD-10-CM | POA: Insufficient documentation

## 2016-05-15 DIAGNOSIS — J309 Allergic rhinitis, unspecified: Secondary | ICD-10-CM | POA: Diagnosis not present

## 2016-05-15 DIAGNOSIS — K648 Other hemorrhoids: Secondary | ICD-10-CM | POA: Insufficient documentation

## 2016-05-15 HISTORY — PX: COLONOSCOPY WITH PROPOFOL: SHX5780

## 2016-05-15 HISTORY — DX: Bronchitis, not specified as acute or chronic: J40

## 2016-05-15 HISTORY — DX: Unspecified osteoarthritis, unspecified site: M19.90

## 2016-05-15 HISTORY — DX: Type 2 diabetes mellitus without complications: E11.9

## 2016-05-15 LAB — GLUCOSE, CAPILLARY: Glucose-Capillary: 109 mg/dL — ABNORMAL HIGH (ref 65–99)

## 2016-05-15 SURGERY — COLONOSCOPY WITH PROPOFOL
Anesthesia: Monitor Anesthesia Care

## 2016-05-15 MED ORDER — LIDOCAINE HCL (CARDIAC) 20 MG/ML IV SOLN
INTRAVENOUS | Status: AC
  Filled 2016-05-15: qty 5

## 2016-05-15 MED ORDER — PROPOFOL 500 MG/50ML IV EMUL
INTRAVENOUS | Status: DC | PRN
  Administered 2016-05-15: 140 ug/kg/min via INTRAVENOUS

## 2016-05-15 MED ORDER — LACTATED RINGERS IV SOLN
INTRAVENOUS | Status: DC | PRN
  Administered 2016-05-15: 08:00:00 via INTRAVENOUS

## 2016-05-15 MED ORDER — SODIUM CHLORIDE 0.9 % IV SOLN: INTRAVENOUS | Status: DC

## 2016-05-15 MED ORDER — PROPOFOL 500 MG/50ML IV EMUL
INTRAVENOUS | Status: DC | PRN
  Administered 2016-05-15 (×2): 20 mg via INTRAVENOUS
  Administered 2016-05-15: 60 mg via INTRAVENOUS
  Administered 2016-05-15: 20 mg via INTRAVENOUS

## 2016-05-15 MED ORDER — BENEFIBER PO POWD: 1.0000 | Freq: Three times a day (TID) | ORAL | Status: DC

## 2016-05-15 MED ORDER — PROPOFOL 10 MG/ML IV BOLUS
INTRAVENOUS | Status: AC
  Filled 2016-05-15: qty 20

## 2016-05-15 MED ORDER — PROPOFOL 10 MG/ML IV BOLUS
INTRAVENOUS | Status: AC
  Filled 2016-05-15: qty 40

## 2016-05-15 SURGICAL SUPPLY — 21 items

## 2016-05-15 NOTE — Transfer of Care (Signed)
Immediate Anesthesia Transfer of Care Note  Patient: Rachel Norris  Procedure(s) Performed: Procedure(s): COLONOSCOPY WITH PROPOFOL (N/A)  Patient Location: PACU  Anesthesia Type:MAC  Level of Consciousness: awake, alert  and oriented  Airway & Oxygen Therapy: Patient Spontanous Breathing and Patient connected to face mask oxygen  Post-op Assessment: Report given to RN and Post -op Vital signs reviewed and stable  Post vital signs: Reviewed and stable  Last Vitals:  Filed Vitals:   05/15/16 0740  BP: 161/80  Pulse: 85  Temp: 36.6 C  Resp: 19    Last Pain: There were no vitals filed for this visit.       Complications: No apparent anesthesia complications

## 2016-05-15 NOTE — Discharge Instructions (Signed)

## 2016-05-15 NOTE — Anesthesia Postprocedure Evaluation (Signed)
Anesthesia Post Note  Patient: Rachel Norris  Procedure(s) Performed: Procedure(s) (LRB): COLONOSCOPY WITH PROPOFOL (N/A)  Patient location during evaluation: PACU Anesthesia Type: MAC Level of consciousness: awake and alert Pain management: pain level controlled Vital Signs Assessment: post-procedure vital signs reviewed and stable Respiratory status: spontaneous breathing, nonlabored ventilation, respiratory function stable and patient connected to nasal cannula oxygen Cardiovascular status: stable and blood pressure returned to baseline Anesthetic complications: no    Last Vitals:  Filed Vitals:   05/15/16 0920 05/15/16 0930  BP: 144/87 142/68  Pulse: 84 76  Temp:    Resp: 18 16    Last Pain: There were no vitals filed for this visit.               Harbor Paster J

## 2016-05-15 NOTE — H&P (Signed)
[facility] History and Physical   Primary Care Physician:  Kathlene November, MD   Reason for Procedure:   Screening for colorectal cancer  Plan:    Colonoscopy with possible interventions     HPI: Rachel Norris is a 55 y.o. female with obesity here for screening colonoscopy. Denies any nausea, vomiting, abdominal pain, melena or bright red blood per rectum    Past Medical History  Diagnosis Date  . Allergic rhinitis   . Anxiety and depression   . Hypertension   . Perimenopausal   . PONV (postoperative nausea and vomiting)   . Prediabetes   . Wears glasses   . GERD (gastroesophageal reflux disease)   . Irregular menstrual bleeding   . Labial cyst     bilateral icclusion  . Thickened endometrium   . At risk for sleep apnea     STOP-BANG= 5            SENT TO PCP 04-05-2015  . Depression   . Anxiety   . Fibroid   . Hormone disorder   . Asthma     Dx 2009-"granuloma on the lung"  . Diabetes mellitus without complication ([place])     "pre diabetes"  . Arthritis     generalized arthritis -knees, feet.back.   . Bronchitis     Bronchitis a few months ago- no issues now. - no Inhalers used daily.    Past Surgical History  Procedure Laterality Date  . Laser ablation of the cervix  1992    DYSPLAGIA  . 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 Hysteroscopy w/d&c N/A 04/08/2015    Procedure: DILATATION AND CURETTAGE /HYSTEROSCOPY, removal of bilateral labial inclusion cysts;  Surgeon: Linda Hedges, DO;  Location: [place];  Service: Gynecology;  Laterality: N/A;  . Ear cyst excision Bilateral 04/08/2015    Procedure: removal of bilateral labial inclusion cysts;  Surgeon: Linda Hedges, DO;  Location: [place];  Service: Gynecology;  Laterality: Bilateral;  . Hysteroscopy    . Dilation and curettage of uterus      Prior  to Admission medications   Medication Sig Start Date End Date Taking? Authorizing Provider  acetaminophen (TYLENOL) 500 MG tablet Take 1,000 mg by mouth every 6 (six) hours as needed (For pain.).    Yes Historical Provider, MD  cetirizine (ALL DAY ALLERGY) 10 MG tablet Take 10 mg by mouth at bedtime as needed for allergies.    Yes Historical Provider, MD  fluticasone (FLONASE) 50 MCG/ACT nasal spray Place 2 sprays into both nostrils daily as needed for allergies.   Yes Historical Provider, MD  hydrochlorothiazide (HYDRODIURIL) 25 MG tablet Take 1 tablet (25 mg total) by mouth daily. 04/09/16  Yes Colon Branch, MD  Menthol, Topical Analgesic, (BENGAY EX) Apply 1 application topically daily as needed (For pain.).   Yes Historical Provider, MD  Misc Natural Products (OSTEO BI-FLEX TRIPLE STRENGTH) TABS Take 2 tablets by mouth every morning.   Yes Historical Provider, MD  Multiple Vitamin (MULTIVITAMIN WITH MINERALS) TABS tablet Take 1 tablet by mouth daily.   Yes Historical Provider, MD  naproxen sodium (ALEVE) 220 MG tablet Take 440 mg by mouth every 8 (eight) hours as needed (For pain.).   Yes Historical Provider, MD  omeprazole (PRILOSEC) 40 MG capsule Take 40 mg by mouth daily as needed (For heartburn or acid reflux.).  Yes Historical Provider, MD  OVER THE COUNTER MEDICATION Place 1 drop into both eyes daily as needed (For allergies.). [employer] Advanced Eye Relief   Yes Historical Provider, MD  SUPREP BOWEL PREP SOLN Place 1 kit rectally as directed. 03/31/16  Yes Historical Provider, MD  valsartan (DIOVAN) 160 MG tablet Take 1 tablet (160 mg total) by mouth daily. 04/09/16  Yes Colon Branch, MD  albuterol (VENTOLIN HFA) 108 (90 Base) MCG/ACT inhaler Inhale 1 puff into the lungs every 4 (four) hours as needed for wheezing or shortness of breath.  01/04/16   Historical Provider, MD    Current Facility-Administered Medications  Medication Dose Route Frequency Provider Last Rate Last Dose  . 0.9 %   sodium chloride infusion   Intravenous Continuous Mauri Pole, MD       Facility-Administered Medications Ordered in Other Encounters  Medication Dose Route Frequency Provider Last Rate Last Dose  . lactated ringers infusion    Continuous PRN Glory Buff, CRNA        Allergies as of 02/24/2016 - Review Complete 02/24/2016  Allergen Reaction Noted  . Sulfa antibiotics Hives 04/05/2015    Family History  Problem Relation Age of Onset  . Diabetes Mother     M anmd others  . Colon cancer Neg Hx   . Breast cancer Neg Hx   . Dementia Mother   . Stroke Father   . Coronary artery disease Neg Hx   . AAA (abdominal aortic aneurysm) Father   . Stroke Mother     Social History   Social History  . Marital Status: Married    Spouse Name: N/A  . Number of Children: 0  . 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 Smoking status: Former Smoker -- 1.00 packs/day for 20 years    Types: Cigarettes    Quit date: 04/04/1996  . Smokeless tobacco: Never Used  . Alcohol Use: 0.0 oz/week    0 Standard drinks or equivalent per week     Comment: rare 6 per year  . Drug Use: No  . Sexual Activity: No   Other Topics Concern  . Not on file   Social History Narrative   Lives w/ husband           Review of Systems:  All other review of systems negative except as mentioned in the HPI.  Physical Exam: Vital signs in last 24 hours: Temp:  [97.9 F (36.6 C)] 97.9 F (36.6 C) (05/16 0740) Pulse Rate:  [85] 85 (05/16 0740) Resp:  [19] 19 (05/16 0740) BP: (161)/(80) 161/80 mmHg (05/16 0740) SpO2:  [97 %] 97 % (05/16 0740) Weight:  [297 lb (134.718 kg)] 297 lb (134.718 kg) (05/16 0740)   General:   Alert,  Well-developed, well-nourished, pleasant and cooperative in NAD Lungs:  Clear throughout to auscultation.   Heart:  Regular rate and rhythm; no murmurs, clicks, rubs,  or gallops. Abdomen:  Soft,  nontender and nondistended. Normal bowel sounds.   Neuro/Psych:  Alert and cooperative. Normal mood and affect. A and O x 3   @K .Denzil Magnuson, MD [facility] [phone] (pager) 05/15/2016 8:39 AM@

## 2016-05-15 NOTE — Op Note (Addendum)
[facility] Patient Name: Rachel Norris Procedure Date: 05/15/2016 MRN: [id number] Attending MD: Mauri Pole , MD Date of Birth: [birth year] CSN: [id number] Age: 55 Admit Type: Outpatient Procedure:                Colonoscopy Indications:              Screening for colorectal malignant neoplasm Providers:                Mauri Pole, MD, Elna Breslow, RN, Tory Emerald, RN, Despina Pole, Technician, Rosario Adie, CRNA Referring MD:              Medicines:                Monitored Anesthesia Care Complications:            No immediate complications. Estimated Blood Loss:     Estimated blood loss: none. Procedure:                Pre-Anesthesia Assessment:                           - Prior to the procedure, a History and Physical                            was performed, and patient medications and                            allergies were reviewed. The patient's tolerance of                            previous anesthesia was also reviewed. The risks                            and benefits of the procedure and the sedation                            options and risks were discussed with the patient.                            All questions were answered, and informed consent                            was obtained. Prior Anticoagulants: The patient has                            taken no previous anticoagulant or antiplatelet                            agents. ASA Grade Assessment: II - A patient with  mild systemic disease. After reviewing the risks                            and benefits, the patient was deemed in                            satisfactory condition to undergo the procedure.                           After obtaining informed consent, the colonoscope                            was passed under direct vision. Throughout the   procedure, the patient's blood pressure, pulse, and                            oxygen saturations were monitored continuously. The                            [id number]) scope was introduced through                            the anus and advanced to the cecum, identified by                            appendiceal orifice and ileocecal valve. After                            obtaining informed consent, the colonoscope was                            passed under direct vision. Throughout the                            procedure, the patient's blood pressure, pulse, and                            oxygen saturations were monitored continuously.The                            colonoscopy was performed without difficulty. The                            patient tolerated the procedure well. The quality                            of the bowel preparation was good. The ileocecal                            valve, appendiceal orifice, and rectum were                            photographed. Scope In: [id number] Scope Out: 9:04:11 AM Scope Withdrawal Time: 0 hours 13 minutes 45 seconds  Total Procedure Duration:  0 hours 17 minutes 56 seconds  Findings:      The perianal and digital rectal examinations were normal.      A few small-mouthed diverticula were found in the sigmoid colon and       descending colon.      Non-bleeding internal hemorrhoids were found during retroflexion. The       hemorrhoids were small. Impression:               - Diverticulosis in the sigmoid colon and in the                            descending colon.                           - Non-bleeding internal hemorrhoids.                           - No specimens collected. Moderate Sedation:      N/A Recommendation:           - Patient has a contact number available for                            emergencies. The signs and symptoms of potential                            delayed complications were discussed with the                             patient. Return to normal activities tomorrow.                            Written discharge instructions were provided to the                            patient.                           - Resume previous diet.                           - Continue present medications.                           - Repeat colonoscopy in 10 years for screening                            purposes.                           - Return to GI clinic PRN. Procedure Code(s):        --- Professional ---                           [id number], Colorectal cancer screening; colonoscopy on                            individual not meeting criteria for  high risk Diagnosis Code(s):        --- Professional ---                           Z12.11, Encounter for screening for malignant                            neoplasm of colon                           K64.8, Other hemorrhoids                           K57.30, Diverticulosis of large intestine without                            perforation or abscess without bleeding CPT copyright 2016 American Medical Association. All rights reserved. The codes documented in this report are preliminary and upon coder review may  be revised to meet current compliance requirements. Mauri Pole, MD 05/15/2016 9:10:39 AM This report has been signed electronically. Number of Addenda: 1 Addendum Number: 1   Addendum Date: 05/15/2016 9:24:06 AM      Schedule for hemorrhoidal banding visit in office, next available      Start Benefiber 1 tablespoon with meals three times daily Mauri Pole, MD 05/15/2016 9:24:55 AM This report has been signed electronically.

## 2016-05-16 ENCOUNTER — Encounter (HOSPITAL_COMMUNITY): Payer: Self-pay | Admitting: Gastroenterology

## 2016-05-17 NOTE — Telephone Encounter (Signed)
Left message requesting patient to return call will need to review surgical information

## 2016-05-21 NOTE — Telephone Encounter (Signed)
Spoke patient regarding recommended procedure. Patient advised she does want to proceed with procedure, but she is on vacation this week and will need to review her work schedule when she returns to her office. Patient advises she will call back when she is ready yo schedule and provide surgery deposit. Routing to [place] for review.

## 2016-05-21 NOTE — Telephone Encounter (Signed)
Lets call her back by 05-31-16 if not called back.  Routing to provider for final review.  Will close encounter.

## 2016-05-22 ENCOUNTER — Telehealth: Payer: Self-pay | Admitting: Obstetrics and Gynecology

## 2016-05-22 NOTE — Telephone Encounter (Signed)
Patient is asking to talk with Rachel Norris again.

## 2016-05-22 NOTE — Telephone Encounter (Signed)
Call back to patient. Advised unable to get procedure scheduled this week. (Patient BMI exceeds limit of out-patient facility).  Still needs pre-op in office before can proceed. Offered June 04, 2016. Patient states she may have to wait till July to get another day off work. Advised would not recommend waiting additional 6 weeks, although risk  for abnormal pathology is low, it is a posibility and would not want to delay diagnosis of possible dysplasia or cancerous condition. Patient will check with employer and call me back when ready to schedule.  Routing to provider for final review. Patient agreeable to disposition. Will close encounter.

## 2016-05-22 NOTE — Telephone Encounter (Signed)
Returned patients call. Patients states she is on vacation this week, but has decided not to go out of town.  Patient asked if there are any surgery openings this week. Routed to [place] for review

## 2016-06-15 ENCOUNTER — Telehealth: Payer: Self-pay | Admitting: Obstetrics and Gynecology

## 2016-06-15 NOTE — Telephone Encounter (Signed)
Patient is returning a call to Suzy. °

## 2016-06-19 NOTE — Telephone Encounter (Signed)
Spoke with patient she advised she is ready to scheduled surgical procedure. Reviewed benefits for surgery. Patient understood and agreeable. Patient provided pre-payment over the phone. Patient aware this is professional benefit only. Patient aware will be contacted by hospital for separate benefits. Staff message to [facility] for scheduling.

## 2016-06-25 ENCOUNTER — Telehealth: Payer: Self-pay | Admitting: Obstetrics and Gynecology

## 2016-06-25 NOTE — Telephone Encounter (Signed)
Previous BMI last year of 50.9 when height listed at 5'4". We have height listed as 5'2.5". BMI for recent colonoscopy was listed as 53.6 based on height of 5' 2.5". Patient states she is 5' 3.5". Will need to confirm height at next appointment.

## 2016-06-25 NOTE — Telephone Encounter (Signed)
Patient calling to schedule surgery

## 2016-06-25 NOTE — Telephone Encounter (Addendum)
Return call to patient. Surgery date options discussed. Desires to proceed with a Thursday after 07-21-16 due to work schedule and ability to request time off without penalty. Initially thought 07-26-16 would work but due to patient's medical history, doesn't meet criteria for [facility] and [facility] is not available on requested date.  Patient requests I contact them directly to discuss. She had procedure there one year ago as well as having colonoscopy at [facility] endoscopy center.  Advised I will try but their guidelines are strict and exceptions are not generally made.

## 2016-06-25 NOTE — Telephone Encounter (Signed)
Call back to patient Advised after review with [facility] pre-op nurse Langley Gauss, she does not meet their criteria and she cannot be schedule at their facility. Advised will need to schedule at [place] available Thursday is 08-30-16. Patient declines earlier date on another day. States she really needs surgery to be on a Thursday and she is fine to wait longer to have it then. Advised will schedule for 08-30-16 and review with Dr Talbert Nan when returns to office.

## 2016-07-03 NOTE — Telephone Encounter (Signed)
Does that mean we are changing the date or location of her surgery? All is fine with me.

## 2016-07-04 NOTE — Telephone Encounter (Signed)
Call to patient. Left message to call back. Surgery is scheduled for 08-30-16 at 0730 at [facility]. Need to schedule pre/post op related appointments and review surgical instructions.

## 2016-07-04 NOTE — Telephone Encounter (Signed)
That's fine

## 2016-07-04 NOTE — Telephone Encounter (Signed)
Procedure has to be at [facility] and patient wants to delay till 08-30-16. I just wanted to confirm you were ok with her waiting till 08-30-16.

## 2016-07-05 NOTE — Telephone Encounter (Signed)
Patient returning call.

## 2016-07-05 NOTE — Telephone Encounter (Signed)
Return call to patient, left message to call back. 

## 2016-07-06 NOTE — Telephone Encounter (Signed)
Patient retruned call. Advised surgery is confirmed for 08-30-16 as she requested.  Surgery appointments scheduled and instruction sheet reviewed and printed copy mailed to patient.  Routing to provider for final review. Patient agreeable to disposition. Will close encounter.

## 2016-08-08 ENCOUNTER — Telehealth: Payer: Self-pay | Admitting: Obstetrics and Gynecology

## 2016-08-08 NOTE — Telephone Encounter (Signed)
Left message to call Kaitlyn at [phone]. 

## 2016-08-08 NOTE — Telephone Encounter (Signed)
Patient wants to speak with the nurse. She states her cycle has slacked off and she has some question before having surgery.

## 2016-08-09 NOTE — Telephone Encounter (Signed)
Spoke with patient. Patient is scheduled to have a D&C on 08/30/2016 with Dr.Jertson. Reports last month when she had her menses she only had light spotting which lasted off and on for 1 week. Reports she started her menses again 2 days ago and bleeding is again a light spotting. Reports she passes a few clots the first day on her menses, but that bleeding has decreased a lot. She would like to know if she can wait another month to see how her cycle is to see if she needs to proceed with surgery. "I do not want to have unnecessary surgery if my cycles are going to start being lighter." Advised I will speak with Dr.Jertson and return call with further recommendations. She is agreeable.

## 2016-08-09 NOTE — Telephone Encounter (Signed)
I think a lot of the bleeding she has had is from the fibroid. At the time of her sonohysterogram her lining was thin, her blood work in the spring was all c/w menopause. Which is what makes me think the fibroid was bleeding. Ultimately the decision is up to her, but her bleeding has been so irregular, I would still recommend surgery.

## 2016-08-09 NOTE — Telephone Encounter (Signed)
Patient is returning Kaitlyn's call. °

## 2016-08-09 NOTE — Telephone Encounter (Signed)
Left detailed message at number provided [phone], okay per ROI. Advised of message as seen below from [place]. Requested return call to office to discuss how she would like to proceed.

## 2016-08-13 ENCOUNTER — Ambulatory Visit (INDEPENDENT_AMBULATORY_CARE_PROVIDER_SITE_OTHER): Payer: BLUE CROSS/BLUE SHIELD | Admitting: Obstetrics and Gynecology

## 2016-08-13 ENCOUNTER — Ambulatory Visit: Payer: BLUE CROSS/BLUE SHIELD | Admitting: Obstetrics and Gynecology

## 2016-08-13 ENCOUNTER — Telehealth: Payer: Self-pay | Admitting: *Deleted

## 2016-08-13 ENCOUNTER — Encounter: Payer: Self-pay | Admitting: Obstetrics and Gynecology

## 2016-08-13 VITALS — BP 160/84 | HR 80 | Resp 16 | Wt 276.0 lb

## 2016-08-13 DIAGNOSIS — D250 Submucous leiomyoma of uterus: Secondary | ICD-10-CM | POA: Diagnosis not present

## 2016-08-13 DIAGNOSIS — N939 Abnormal uterine and vaginal bleeding, unspecified: Secondary | ICD-10-CM

## 2016-08-13 DIAGNOSIS — L293 Anogenital pruritus, unspecified: Secondary | ICD-10-CM | POA: Diagnosis not present

## 2016-08-13 DIAGNOSIS — N762 Acute vulvitis: Secondary | ICD-10-CM

## 2016-08-13 MED ORDER — BETAMETHASONE VALERATE 0.1 % EX OINT: TOPICAL_OINTMENT | CUTANEOUS | 0 refills | Status: DC

## 2016-08-13 NOTE — Telephone Encounter (Signed)
Patient calling to states she is not having any heavy bleeding any longer and does not feel like her surgery is necessary. Advised that AUB and old calcified fibroid with her BMI but her at higher risk for development of abnormal cells in the uterus and that thorogh evaluation is recommended bu Dr Talbert Nan.  Recommended she proceed with surgery consult to discuss concerns and after MD evaluates, see if there is any change in recommendations.  Patient agreeable but will have to call back to let me know when she can come in due to work schedule.

## 2016-08-13 NOTE — Progress Notes (Signed)
GYNECOLOGY  VISIT   HPI: 55 y.o.   Married  Caucasian  female   [place] with Patient's last menstrual period was 08/11/2016.   here for surgery consult. She is scheduled on 08-30-16 for [facility] hysteroscopy with myosure.  She has had abnormal uterine bleeding. Improved the last 2 months. She had a sonohysterogram a few months ago and had a 2 cm intracavitary myoma and an otherwise thin stripe. Blood work c/w menopause.  Normal pap in 3/17.  Not sexually active.  She c/o vulvar itching off and on for a couple of weeks, mild. No abnormal d/c, no odor.  She c/o urinary frequency, drinking more water. Voiding normal amounts. No dysuria, some urgency to void.    GYNECOLOGIC HISTORY: Patient's last menstrual period was 08/11/2016. Contraception:none Menopausal hormone therapy: none         OB History    Gravida Para Term Preterm AB Living   0 0 0 0 0 0   SAB TAB Ectopic Multiple Live Births   0 0 0 0           Patient Active Problem List   Diagnosis Date Noted  . Encounter for screening colonoscopy   . PCP NOTES >>> 10/07/2015  . Fatigue 07/12/2015  . Diabetes ([place]) 07/27/2013  . Annual physical exam 04/24/2012  . Pain in both feet 03/14/2012  . Nail lesion 03/14/2012  . EXOGENOUS OBESITY 08/29/2009  . Asthma 06/22/2008  . ANXIETY 03/07/2007  . Essential hypertension 03/07/2007  . ALLERGIC RHINITIS 03/07/2007  Diet controlled diabetes. Mild asthma, uses her inhaler 2 x a year  Past Medical History:  Diagnosis Date  . Allergic rhinitis   . Anxiety   . Anxiety and depression   . Arthritis    generalized arthritis -knees, feet.back.   . 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 At risk for sleep apnea    STOP-BANG= 5            SENT TO PCP 04-05-2015  . Bronchitis    Bronchitis a few months ago- no issues now. - no Inhalers used daily.  . Depression   . Diabetes mellitus without complication ([place])    "pre diabetes"  . Fibroid   . GERD (gastroesophageal reflux disease)    . Hormone disorder   . Hypertension   . Irregular menstrual bleeding   . Labial cyst    bilateral icclusion  . Perimenopausal   . PONV (postoperative nausea and vomiting)   . Prediabetes   . Thickened endometrium   . Wears glasses     Past Surgical History:  Procedure Laterality Date  . COLONOSCOPY WITH PROPOFOL N/A 05/15/2016   Procedure: COLONOSCOPY WITH PROPOFOL;  Surgeon: Mauri Pole, MD;  Location: [facility];  Service: Endoscopy;  Laterality: N/A;  . DILATION AND CURETTAGE OF UTERUS    . EAR CYST EXCISION Bilateral 04/08/2015   Procedure: removal of bilateral labial inclusion cysts;  Surgeon: Linda Hedges, DO;  Location: [facility];  Service: Gynecology;  Laterality: Bilateral;  . HYSTEROSCOPY    . HYSTEROSCOPY W/D&C N/A 04/08/2015   Procedure: DILATATION AND CURETTAGE /HYSTEROSCOPY, removal of bilateral labial inclusion cysts;  Surgeon: Linda Hedges, DO;  Location: [facility];  Service: Gynecology;  Laterality: N/A;  . LASER ABLATION OF THE CERVIX  1992   DYSPLAGIA  . 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 acetaminophen (TYLENOL) 500 MG tablet Take 1,000 mg by mouth every 6 (six) hours as needed (For pain.).     Marland Kitchen albuterol (VENTOLIN HFA) 108 (90 Base) MCG/ACT inhaler Inhale 1 puff into the lungs every 4 (four) hours as needed for wheezing or shortness of breath.     . cetirizine (ALL DAY ALLERGY) 10 MG tablet Take 10 mg by mouth at bedtime as needed for allergies.     . fluticasone (FLONASE) 50 MCG/ACT nasal spray Place 2 sprays into both nostrils daily as needed for allergies.    . hydrochlorothiazide (HYDRODIURIL) 25 MG tablet Take 1 tablet (25 mg total) by mouth daily. 90 tablet 1  . Menthol, Topical Analgesic, (BENGAY EX) Apply 1 application topically  daily as needed (For pain.).    Marland Kitchen Misc Natural Products (OSTEO BI-FLEX TRIPLE STRENGTH) TABS Take 2 tablets by mouth every morning.    . Multiple Vitamin (MULTIVITAMIN WITH MINERALS) TABS tablet Take 1 tablet by mouth daily.    . naproxen sodium (ALEVE) 220 MG tablet Take 440 mg by mouth every 8 (eight) hours as needed (For pain.).    Marland Kitchen omeprazole (PRILOSEC) 40 MG capsule Take 40 mg by mouth daily as needed (For heartburn or acid reflux.).     Marland Kitchen valsartan (DIOVAN) 160 MG tablet Take 1 tablet (160 mg total) by mouth daily. 90 tablet 1   No current facility-administered medications for this visit.      ALLERGIES: Sulfa antibiotics  Family History  Problem Relation Age of Onset  . Diabetes Mother     M anmd others  . Dementia Mother   . Stroke Mother   . Stroke Father   . AAA (abdominal aortic aneurysm) Father   . Colon cancer Neg Hx   . Breast cancer Neg Hx   . Coronary artery disease Neg Hx     Social History   Social History  . Marital status: Married    Spouse name: N/A  . Number of children: 0  . Years of education: N/A   Occupational History  . scrub at the OR in [employer]   Social History Main Topics  . Smoking status: Former Smoker    Packs/day: 1.00    Years: 20.00    Types: Cigarettes    Quit date: 04/04/1996  . Smokeless tobacco: Never Used  . Alcohol use 0.0 oz/week     Comment: rare 6 per year  . Drug use: No  . Sexual activity: No   Other Topics Concern  . Not on file   Social History Narrative   Lives w/ husband           Review of Systems  Constitutional: Negative.   HENT: Negative.   Eyes: Negative.   Respiratory: Negative.   Cardiovascular: Negative.   Gastrointestinal: Negative.   Genitourinary: Positive for frequency.       Vaginal itching   Musculoskeletal: Positive for myalgias.  Skin: Negative.   Neurological: Negative.   Endo/Heme/Allergies: Negative.   Psychiatric/Behavioral: Negative.     PHYSICAL EXAMINATION:     BP (!) 160/84 (BP Location: Right Arm, Patient Position: Sitting, Cuff Size: Large)   Pulse 80   Resp 16   Wt 276 lb (125.2 kg)   LMP 08/11/2016   BMI 49.68 kg/m     General appearance: alert, cooperative and appears stated age Neck: no adenopathy, supple, symmetrical, trachea midline and thyroid normal to inspection  and palpation Heart: regular rate and rhythm Lungs: CTAB Abdomen: soft, non-tender; bowel sounds normal; no masses,  no organomegaly Lungs: CTAB Extremities: normal, atraumatic, no cyanosis Skin: normal color, texture and turgor, no rashes or lesions Lymph: normal cervical supraclavicular and inguinal nodes Neurologic: grossly normal   Pelvic: External genitalia:  no lesions, minimal erythema              Urethra:  normal appearing urethra with no masses, tenderness or lesions              Bartholins and Skenes: normal                 Vagina: normal appearing vagina with normal color and discharge, no lesions, mild atrophy              Cervix: no lesions               Chaperone was present for exam.  Wet prep: no clue, no trich, ++ wbc KOH: no yeast PH: 5   ASSESSMENT Abnormal uterine bleeding, unclear if perimenopausal or PMP (she has never gone a year without bleeding, but had a thin stripe, PMP labs and an intracavitary mass that is likely bleeding) Intracavitary bleeding Menopausal lab work Genital pruritus/vulvitis     PLAN Plan: hysteroscopy, myomectomy, dilation and curettage. Reviewed risks, including: bleeding, infection, uterine perforation, fluid overload, need for further sugery Wet prep probe sent Treat with steroid ointment    An After Visit Summary was printed and given to the patient.

## 2016-08-13 NOTE — Patient Instructions (Signed)
Hysteroscopy °Hysteroscopy is a procedure used for looking inside the womb (uterus). It may be done for various reasons, including: °· To evaluate abnormal bleeding, fibroid (benign, noncancerous) tumors, polyps, scar tissue (adhesions), and possibly cancer of the uterus. °· To look for lumps (tumors) and other uterine growths. °· To look for causes of why a woman cannot get pregnant (infertility), causes of recurrent loss of pregnancy (miscarriages), or a lost intrauterine device (IUD). °· To perform a sterilization by blocking the fallopian tubes from inside the uterus. °In this procedure, a thin, flexible tube with a tiny light and camera on the end of it (hysteroscope) is used to look inside the uterus. A hysteroscopy should be done right after a menstrual period to be sure you are not pregnant. °LET YOUR HEALTH CARE PROVIDER KNOW ABOUT:  °· Any allergies you have. °· All medicines you are taking, including vitamins, herbs, eye drops, creams, and over-the-counter medicines. °· Previous problems you or members of your family have had with the use of anesthetics. °· Any blood disorders you have. °· Previous surgeries you have had. °· Medical conditions you have. °RISKS AND COMPLICATIONS  °Generally, this is a safe procedure. However, as with any procedure, complications can occur. Possible complications include: °· Putting a hole in the uterus. °· Excessive bleeding. °· Infection. °· Damage to the cervix. °· Injury to other organs. °· Allergic reaction to medicines. °· Too much fluid used in the uterus for the procedure. °BEFORE THE PROCEDURE  °· Ask your health care provider about changing or stopping any regular medicines. °· Do not take aspirin or blood thinners for 1 week before the procedure, or as directed by your health care provider. These can cause bleeding. °· If you smoke, do not smoke for 2 weeks before the procedure. °· In some cases, a medicine is placed in the cervix the day before the procedure.  This medicine makes the cervix have a larger opening (dilate). This makes it easier for the instrument to be inserted into the uterus during the procedure. °· Do not eat or drink anything for at least 8 hours before the surgery. °· Arrange for someone to take you home after the procedure. °PROCEDURE  °· You may be given a medicine to relax you (sedative). You may also be given one of the following: °¨ A medicine that numbs the area around the cervix (local anesthetic). °¨ A medicine that makes you sleep through the procedure (general anesthetic). °· The hysteroscope is inserted through the vagina into the uterus. The camera on the hysteroscope sends a picture to a TV screen. This gives the surgeon a good view inside the uterus. °· During the procedure, air or a liquid is put into the uterus, which allows the surgeon to see better. °· Sometimes, tissue is gently scraped from inside the uterus. These tissue samples are sent to a lab for testing. °AFTER THE PROCEDURE  °· If you had a general anesthetic, you may be groggy for a couple hours after the procedure. °· If you had a local anesthetic, you will be able to go home as soon as you are stable and feel ready. °· You may have some cramping. This normally lasts for a couple days. °· You may have bleeding, which varies from light spotting for a few days to menstrual-like bleeding for 3-7 days. This is normal. °· If your test results are not back during the visit, make an appointment with your health care provider to find out the   results.   This information is not intended to replace advice given to you by your health care provider. Make sure you discuss any questions you have with your health care provider.   Document Released: 03/25/2001 Document Revised: 10/07/2013 Document Reviewed: 07/16/2013 Elsevier Interactive Patient Education [employer].

## 2016-08-13 NOTE — Telephone Encounter (Signed)
Patient returned call and appointment scheduled for his afternoon to discuss surgery.  Routing to provider for final review. Patient agreeable to disposition. Will close encounter.

## 2016-08-14 LAB — WET PREP BY MOLECULAR PROBE
CANDIDA SPECIES: NEGATIVE
Gardnerella vaginalis: NEGATIVE
Trichomonas vaginosis: NEGATIVE

## 2016-08-14 NOTE — Telephone Encounter (Signed)
See next encounter and office visit note from 08-13-16.  Routing to provider for final review. Patient agreeable to disposition. Will close encounter.

## 2016-08-20 ENCOUNTER — Encounter (HOSPITAL_COMMUNITY)
Admission: RE | Admit: 2016-08-20 | Discharge: 2016-08-20 | Disposition: A | Discharge status: Home / Self Care | Payer: BLUE CROSS/BLUE SHIELD | Source: Ambulatory Visit | Attending: Obstetrics and Gynecology | Admitting: Obstetrics and Gynecology

## 2016-08-20 ENCOUNTER — Other Ambulatory Visit (HOSPITAL_COMMUNITY): Payer: BLUE CROSS/BLUE SHIELD

## 2016-08-20 ENCOUNTER — Other Ambulatory Visit: Payer: Self-pay

## 2016-08-20 ENCOUNTER — Encounter (HOSPITAL_COMMUNITY): Payer: Self-pay

## 2016-08-20 DIAGNOSIS — Z01812 Encounter for preprocedural laboratory examination: Secondary | ICD-10-CM | POA: Insufficient documentation

## 2016-08-20 DIAGNOSIS — Z0183 Encounter for blood typing: Secondary | ICD-10-CM | POA: Diagnosis not present

## 2016-08-20 DIAGNOSIS — Z01818 Encounter for other preprocedural examination: Secondary | ICD-10-CM | POA: Insufficient documentation

## 2016-08-20 DIAGNOSIS — N939 Abnormal uterine and vaginal bleeding, unspecified: Secondary | ICD-10-CM | POA: Diagnosis not present

## 2016-08-20 DIAGNOSIS — R938 Abnormal findings on diagnostic imaging of other specified body structures: Secondary | ICD-10-CM | POA: Diagnosis not present

## 2016-08-20 DIAGNOSIS — R9431 Abnormal electrocardiogram [ECG] [EKG]: Secondary | ICD-10-CM | POA: Insufficient documentation

## 2016-08-20 DIAGNOSIS — D259 Leiomyoma of uterus, unspecified: Secondary | ICD-10-CM | POA: Insufficient documentation

## 2016-08-20 HISTORY — DX: Prediabetes: R73.03

## 2016-08-20 HISTORY — DX: Radial styloid tenosynovitis (de quervain): M65.4

## 2016-08-20 HISTORY — DX: Palpitations: R00.2

## 2016-08-20 HISTORY — DX: Reserved for inherently not codable concepts without codable children: IMO0001

## 2016-08-20 HISTORY — DX: Effusion, unspecified ankle: M25.473

## 2016-08-20 LAB — COMPREHENSIVE METABOLIC PANEL
ALT: 16 U/L (ref 14–54)
AST: 16 U/L (ref 15–41)
Albumin: 3.9 g/dL (ref 3.5–5.0)
Alkaline Phosphatase: 60 U/L (ref 38–126)
Anion gap: 6 (ref 5–15)
BUN: 15 mg/dL (ref 6–20)
CHLORIDE: 102 mmol/L (ref 101–111)
CO2: 29 mmol/L (ref 22–32)
Calcium: 8.7 mg/dL — ABNORMAL LOW (ref 8.9–10.3)
Creatinine, Ser: 0.46 mg/dL (ref 0.44–1.00)
Glucose, Bld: 111 mg/dL — ABNORMAL HIGH (ref 65–99)
POTASSIUM: 3.8 mmol/L (ref 3.5–5.1)
Sodium: 137 mmol/L (ref 135–145)
Total Bilirubin: 0.4 mg/dL (ref 0.3–1.2)
Total Protein: 7.1 g/dL (ref 6.5–8.1)

## 2016-08-20 LAB — CBC
HCT: 37.1 % (ref 36.0–46.0)
Hemoglobin: 12.7 g/dL (ref 12.0–15.0)
MCH: 32.1 pg (ref 26.0–34.0)
MCHC: 34.2 g/dL (ref 30.0–36.0)
MCV: 93.7 fL (ref 78.0–100.0)
PLATELETS: 323 10*3/uL (ref 150–400)
RBC: 3.96 MIL/uL (ref 3.87–5.11)
RDW: 13.2 % (ref 11.5–15.5)
WBC: 7.7 10*3/uL (ref 4.0–10.5)

## 2016-08-20 LAB — ABO/RH: ABO/RH(D): A POS

## 2016-08-20 LAB — TYPE AND SCREEN
ABO/RH(D): A POS
Antibody Screen: NEGATIVE

## 2016-08-20 NOTE — Patient Instructions (Addendum)
Your procedure is scheduled on:  Thursday, August 30, 2016  Enter through the Main Entrance of [facility] at:  6:00 AM  Pick up the phone at the desk and dial [phone].  Call this number if you have problems the morning of surgery: [phone].  Remember: Do NOT eat food or drink after:  Midnight Wednesday, August 29, 2016  Take these medicines the morning of surgery with a SIP OF WATER:  Hydrochlorothiazide, Omeprazole  Bring Asthma Inhaler day of surgery  Do NOT wear jewelry (body piercing), metal hair clips/bobby pins, make-up, or nail polish. Do NOT wear lotions, powders, or perfumes.  You may wear deodorant. Do NOT shave for 48 hours prior to surgery. Do NOT bring valuables to the hospital. Contacts, dentures, or bridgework may not be worn into surgery.  Have a responsible adult drive you home and stay with you for 24 hours after your procedure  Please bring paperwork for living will day of surgery.

## 2016-08-20 NOTE — Progress Notes (Signed)
   08/20/16 1116  OBSTRUCTIVE SLEEP APNEA  Have you ever been diagnosed with sleep apnea through a sleep study? No  Do you snore loudly (loud enough to be heard through closed doors)?  0  Do you often feel tired, fatigued, or sleepy during the daytime (such as falling asleep during driving or talking to someone)? 0  Has anyone observed you stop breathing during your sleep? 1  Do you have, or are you being treated for high blood pressure? 1  BMI more than 35 kg/m2? 1  Age > 50 (1-yes) 1  Neck circumference greater than:Female 16 inches or larger, Female 17inches or larger? 1  Female Gender (Yes=1) 0  Obstructive Sleep Apnea Score 5

## 2016-08-21 LAB — HEMOGLOBIN A1C
HEMOGLOBIN A1C: 6.4 % — AB (ref 4.8–5.6)
Mean Plasma Glucose: 137 mg/dL

## 2016-08-21 NOTE — Pre-Procedure Instructions (Signed)
Dr. Larose Kells was faxed a copy of Ms. Emory-Heath's obstructive sleep apnea score.

## 2016-08-21 NOTE — Pre-Procedure Instructions (Signed)
Dr. Jillyn Hidden made aware of EKG, no new orders received at this time.

## 2016-08-29 NOTE — Anesthesia Preprocedure Evaluation (Addendum)
Anesthesia Evaluation  Patient identified by MRN, date of birth, ID band Patient awake    Reviewed: Allergy & Precautions, NPO status , Patient's Chart, lab work & pertinent test results  History of Anesthesia Complications (+) PONV  Airway Mallampati: II  TM Distance: >3 FB Neck ROM: Full    Dental no notable dental hx.    Pulmonary former smoker,    breath sounds clear to auscultation + decreased breath sounds      Cardiovascular hypertension, Normal cardiovascular exam Rhythm:Regular Rate:Normal     Neuro/Psych negative neurological ROS  negative psych ROS   GI/Hepatic negative GI ROS, Neg liver ROS,   Endo/Other  diabetesMorbid obesity  Renal/GU negative Renal ROS  negative genitourinary   Musculoskeletal negative musculoskeletal ROS (+)   Abdominal (+) + obese,   Peds negative pediatric ROS (+)  Hematology negative hematology ROS (+)   Anesthesia Other Findings   Reproductive/Obstetrics negative OB ROS                            Anesthesia Physical Anesthesia Plan  ASA: III  Anesthesia Plan: General   Post-op Pain Management:    Induction: Intravenous  Airway Management Planned: Oral ETT and Video Laryngoscope Planned  Additional Equipment:   Intra-op Plan:   Post-operative Plan: Extubation in OR  Informed Consent: I have reviewed the patients History and Physical, chart, labs and discussed the procedure including the risks, benefits and alternatives for the proposed anesthesia with the patient or authorized representative who has indicated his/her understanding and acceptance.   Dental advisory given  Plan Discussed with: CRNA and Surgeon  Anesthesia Plan Comments:        Anesthesia Quick Evaluation

## 2016-08-30 ENCOUNTER — Encounter (HOSPITAL_COMMUNITY)
Admission: RE | Disposition: A | Discharge status: Home / Self Care | Payer: Self-pay | Source: Ambulatory Visit | Attending: Obstetrics and Gynecology

## 2016-08-30 ENCOUNTER — Ambulatory Visit (HOSPITAL_COMMUNITY): Payer: BLUE CROSS/BLUE SHIELD | Admitting: Anesthesiology

## 2016-08-30 ENCOUNTER — Ambulatory Visit (HOSPITAL_COMMUNITY)
Admission: RE | Admit: 2016-08-30 | Discharge: 2016-08-30 | Disposition: A | Discharge status: Home / Self Care | Payer: BLUE CROSS/BLUE SHIELD | Source: Ambulatory Visit | Attending: Obstetrics and Gynecology | Admitting: Obstetrics and Gynecology

## 2016-08-30 ENCOUNTER — Encounter (HOSPITAL_COMMUNITY): Payer: Self-pay

## 2016-08-30 DIAGNOSIS — I10 Essential (primary) hypertension: Secondary | ICD-10-CM | POA: Insufficient documentation

## 2016-08-30 DIAGNOSIS — E119 Type 2 diabetes mellitus without complications: Secondary | ICD-10-CM | POA: Insufficient documentation

## 2016-08-30 DIAGNOSIS — Z87891 Personal history of nicotine dependence: Secondary | ICD-10-CM | POA: Diagnosis not present

## 2016-08-30 DIAGNOSIS — N938 Other specified abnormal uterine and vaginal bleeding: Secondary | ICD-10-CM | POA: Diagnosis not present

## 2016-08-30 DIAGNOSIS — N939 Abnormal uterine and vaginal bleeding, unspecified: Secondary | ICD-10-CM | POA: Diagnosis not present

## 2016-08-30 DIAGNOSIS — Z6841 Body Mass Index (BMI) 40.0 and over, adult: Secondary | ICD-10-CM | POA: Diagnosis not present

## 2016-08-30 DIAGNOSIS — D259 Leiomyoma of uterus, unspecified: Secondary | ICD-10-CM | POA: Insufficient documentation

## 2016-08-30 DIAGNOSIS — K219 Gastro-esophageal reflux disease without esophagitis: Secondary | ICD-10-CM | POA: Insufficient documentation

## 2016-08-30 DIAGNOSIS — J45909 Unspecified asthma, uncomplicated: Secondary | ICD-10-CM | POA: Insufficient documentation

## 2016-08-30 DIAGNOSIS — N762 Acute vulvitis: Secondary | ICD-10-CM | POA: Diagnosis not present

## 2016-08-30 HISTORY — PX: DILATATION & CURETTAGE/HYSTEROSCOPY WITH MYOSURE: SHX6511

## 2016-08-30 HISTORY — PX: MYOMECTOMY: SHX85

## 2016-08-30 LAB — GLUCOSE, CAPILLARY: GLUCOSE-CAPILLARY: 122 mg/dL — AB (ref 65–99)

## 2016-08-30 SURGERY — DILATATION & CURETTAGE/HYSTEROSCOPY WITH MYOSURE
Anesthesia: General | Site: Vagina

## 2016-08-30 MED ORDER — MIDAZOLAM HCL 2 MG/2ML IJ SOLN
INTRAMUSCULAR | Status: DC | PRN
Start: 2016-08-30 — End: 2016-08-30
  Administered 2016-08-30: 2 mg via INTRAVENOUS

## 2016-08-30 MED ORDER — LACTATED RINGERS IV SOLN
INTRAVENOUS | Status: DC
  Administered 2016-08-30 (×2): via INTRAVENOUS

## 2016-08-30 MED ORDER — ONDANSETRON HCL 4 MG/2ML IJ SOLN
INTRAMUSCULAR | Status: AC
  Filled 2016-08-30: qty 2

## 2016-08-30 MED ORDER — SUCCINYLCHOLINE CHLORIDE 20 MG/ML IJ SOLN
INTRAMUSCULAR | Status: DC | PRN
  Administered 2016-08-30: 140 mg via INTRAVENOUS

## 2016-08-30 MED ORDER — SODIUM CHLORIDE 0.9 % IJ SOLN
INTRAMUSCULAR | Status: AC
  Filled 2016-08-30: qty 50

## 2016-08-30 MED ORDER — FENTANYL CITRATE (PF) 100 MCG/2ML IJ SOLN
INTRAMUSCULAR | Status: AC
  Filled 2016-08-30: qty 2

## 2016-08-30 MED ORDER — ONDANSETRON HCL 4 MG/2ML IJ SOLN
INTRAMUSCULAR | Status: DC | PRN
  Administered 2016-08-30: 4 mg via INTRAVENOUS

## 2016-08-30 MED ORDER — SUGAMMADEX SODIUM 500 MG/5ML IV SOLN
INTRAVENOUS | Status: AC
  Filled 2016-08-30: qty 5

## 2016-08-30 MED ORDER — VASOPRESSIN 20 UNIT/ML IV SOLN
INTRAVENOUS | Status: AC
  Filled 2016-08-30: qty 1

## 2016-08-30 MED ORDER — PHENYLEPHRINE 40 MCG/ML (10ML) SYRINGE FOR IV PUSH (FOR BLOOD PRESSURE SUPPORT)
PREFILLED_SYRINGE | INTRAVENOUS | Status: AC
  Filled 2016-08-30: qty 10

## 2016-08-30 MED ORDER — PROPOFOL 10 MG/ML IV BOLUS
INTRAVENOUS | Status: AC
  Filled 2016-08-30: qty 20

## 2016-08-30 MED ORDER — KETOROLAC TROMETHAMINE 30 MG/ML IJ SOLN
INTRAMUSCULAR | Status: AC
  Filled 2016-08-30: qty 1

## 2016-08-30 MED ORDER — DIPHENHYDRAMINE HCL 50 MG/ML IJ SOLN
INTRAMUSCULAR | Status: DC | PRN
  Administered 2016-08-30: 12.5 mg via INTRAVENOUS

## 2016-08-30 MED ORDER — PROPOFOL 10 MG/ML IV BOLUS
INTRAVENOUS | Status: DC | PRN
  Administered 2016-08-30: 300 mg via INTRAVENOUS

## 2016-08-30 MED ORDER — ROCURONIUM BROMIDE 100 MG/10ML IV SOLN
INTRAVENOUS | Status: AC
  Filled 2016-08-30: qty 1

## 2016-08-30 MED ORDER — SODIUM CHLORIDE 0.9 % IV SOLN
INTRAVENOUS | Status: DC | PRN
  Administered 2016-08-30: 20 mL via INTRAMUSCULAR

## 2016-08-30 MED ORDER — PROPOFOL 500 MG/50ML IV EMUL
INTRAVENOUS | Status: DC | PRN
  Administered 2016-08-30: 150 ug/kg/min via INTRAVENOUS

## 2016-08-30 MED ORDER — PHENYLEPHRINE HCL 10 MG/ML IJ SOLN
INTRAMUSCULAR | Status: DC | PRN
  Administered 2016-08-30: 80 ug via INTRAVENOUS
  Administered 2016-08-30: 40 ug via INTRAVENOUS
  Administered 2016-08-30 (×2): 80 ug via INTRAVENOUS

## 2016-08-30 MED ORDER — METOCLOPRAMIDE HCL 5 MG/ML IJ SOLN
INTRAMUSCULAR | Status: DC | PRN
  Administered 2016-08-30: 10 mg via INTRAVENOUS

## 2016-08-30 MED ORDER — SCOPOLAMINE 1 MG/3DAYS TD PT72
1.0000 | MEDICATED_PATCH | Freq: Once | TRANSDERMAL | Status: DC
  Administered 2016-08-30: 1.5 mg via TRANSDERMAL

## 2016-08-30 MED ORDER — DEXAMETHASONE SODIUM PHOSPHATE 4 MG/ML IJ SOLN
INTRAMUSCULAR | Status: AC
  Filled 2016-08-30: qty 1

## 2016-08-30 MED ORDER — SUCCINYLCHOLINE CHLORIDE 200 MG/10ML IV SOSY
PREFILLED_SYRINGE | INTRAVENOUS | Status: AC
  Filled 2016-08-30: qty 10

## 2016-08-30 MED ORDER — FENTANYL CITRATE (PF) 100 MCG/2ML IJ SOLN
INTRAMUSCULAR | Status: DC | PRN
Start: 2016-08-30 — End: 2016-08-30
  Administered 2016-08-30: 100 ug via INTRAVENOUS

## 2016-08-30 MED ORDER — KETOROLAC TROMETHAMINE 30 MG/ML IJ SOLN
INTRAMUSCULAR | Status: DC | PRN
  Administered 2016-08-30: 30 mg via INTRAVENOUS

## 2016-08-30 MED ORDER — DEXAMETHASONE SODIUM PHOSPHATE 10 MG/ML IJ SOLN
INTRAMUSCULAR | Status: DC | PRN
  Administered 2016-08-30: 4 mg via INTRAVENOUS

## 2016-08-30 MED ORDER — DIPHENHYDRAMINE HCL 50 MG/ML IJ SOLN
INTRAMUSCULAR | Status: AC
  Filled 2016-08-30: qty 1

## 2016-08-30 MED ORDER — LACTATED RINGERS IV SOLN
INTRAVENOUS | Status: DC
  Administered 2016-08-30: 125 mL/h via INTRAVENOUS

## 2016-08-30 MED ORDER — LIDOCAINE HCL (CARDIAC) 20 MG/ML IV SOLN
INTRAVENOUS | Status: DC | PRN
  Administered 2016-08-30: 80 mg via INTRAVENOUS

## 2016-08-30 MED ORDER — LIDOCAINE HCL (CARDIAC) 20 MG/ML IV SOLN
INTRAVENOUS | Status: AC
  Filled 2016-08-30: qty 5

## 2016-08-30 MED ORDER — MIDAZOLAM HCL 2 MG/2ML IJ SOLN
INTRAMUSCULAR | Status: AC
  Filled 2016-08-30: qty 2

## 2016-08-30 MED ORDER — SCOPOLAMINE 1 MG/3DAYS TD PT72
MEDICATED_PATCH | TRANSDERMAL | Status: AC
  Filled 2016-08-30: qty 1

## 2016-08-30 MED ORDER — PROMETHAZINE HCL 25 MG/ML IJ SOLN: 6.2500 mg | INTRAMUSCULAR | Status: DC | PRN

## 2016-08-30 MED ORDER — ROCURONIUM BROMIDE 100 MG/10ML IV SOLN
INTRAVENOUS | Status: DC | PRN
  Administered 2016-08-30: 20 mg via INTRAVENOUS

## 2016-08-30 MED ORDER — SUGAMMADEX SODIUM 500 MG/5ML IV SOLN
INTRAVENOUS | Status: DC | PRN
  Administered 2016-08-30: 280 mg via INTRAVENOUS

## 2016-08-30 MED ORDER — FENTANYL CITRATE (PF) 100 MCG/2ML IJ SOLN
25.0000 ug | INTRAMUSCULAR | Status: DC | PRN
  Administered 2016-08-30 (×2): 25 ug via INTRAVENOUS

## 2016-08-30 MED ORDER — METOCLOPRAMIDE HCL 5 MG/ML IJ SOLN
INTRAMUSCULAR | Status: AC
  Filled 2016-08-30: qty 2

## 2016-08-30 MED ORDER — SODIUM CHLORIDE 0.9 % IR SOLN
Status: DC | PRN
  Administered 2016-08-30: 3000 mL

## 2016-08-30 SURGICAL SUPPLY — 23 items
CANISTER SUCT 3000ML (MISCELLANEOUS) ×3 IMPLANT
CATH ROBINSON RED A/P 16FR (CATHETERS) ×3 IMPLANT
CLOTH BEACON ORANGE TIMEOUT ST (SAFETY) ×3 IMPLANT
CONTAINER PREFILL 10% NBF 60ML (FORM) ×9 IMPLANT
DEVICE MYOSURE LITE (MISCELLANEOUS) IMPLANT
DEVICE MYOSURE REACH (MISCELLANEOUS) IMPLANT
FILTER ARTHROSCOPY CONVERTOR (FILTER) ×3 IMPLANT
GLOVE BIO SURGEON STRL SZ 6.5 (GLOVE) ×2 IMPLANT
GLOVE BIO SURGEONS STRL SZ 6.5 (GLOVE) ×1
GLOVE BIOGEL PI IND STRL 7.0 (GLOVE) ×2 IMPLANT
GLOVE BIOGEL PI INDICATOR 7.0 (GLOVE) ×4
GOWN STRL REUS W/TWL LRG LVL3 (GOWN DISPOSABLE) ×6 IMPLANT
HOVERMATT HALF SINGLE USE (PATIENT TRANSFER) ×3 IMPLANT
MYOSURE XL FIBROID REM (MISCELLANEOUS) ×3
PACK VAGINAL MINOR WOMEN LF (CUSTOM PROCEDURE TRAY) ×3 IMPLANT
PAD OB MATERNITY 4.3X12.25 (PERSONAL CARE ITEMS) ×3 IMPLANT
SEAL ROD LENS SCOPE MYOSURE (ABLATOR) ×3 IMPLANT
SYR 20CC LL (SYRINGE) ×3 IMPLANT
SYSTEM TISS REMOVAL MYSR XL RM (MISCELLANEOUS) ×1 IMPLANT
TOWEL OR 17X24 6PK STRL BLUE (TOWEL DISPOSABLE) ×6 IMPLANT
TUBING AQUILEX INFLOW (TUBING) ×3 IMPLANT
TUBING AQUILEX OUTFLOW (TUBING) ×3 IMPLANT
WATER STERILE IRR 1000ML POUR (IV SOLUTION) ×3 IMPLANT

## 2016-08-30 NOTE — Interval H&P Note (Signed)
History and Physical Interval Note:  08/30/2016 7:15 AM  Rachel Norris  has presented today for surgery, with the diagnosis of Abnormal Uterine Bleeding, thicken endometrium, uterine fibroid  The various methods of treatment have been discussed with the patient and family. After consideration of risks, benefits and other options for treatment, the patient has consented to  Procedure(s) with comments: [place] (N/A) - hysteroscopic myomectomy. BMI 53.8 as a surgical intervention .  The patient's history has been reviewed, patient examined, no change in status, stable for surgery.  I have reviewed the patient's chart and labs.  Questions were answered to the patient's satisfaction.     Salvadore Dom

## 2016-08-30 NOTE — Discharge Instructions (Signed)

## 2016-08-30 NOTE — Anesthesia Procedure Notes (Signed)
Procedure Name: Intubation Date/Time: 08/30/2016 7:34 AM Performed by: Raenette Rover Pre-anesthesia Checklist: Patient identified, Emergency Drugs available, Suction available and Patient being monitored Patient Re-evaluated:Patient Re-evaluated prior to inductionOxygen Delivery Method: Circle system utilized Preoxygenation: Pre-oxygenation with 100% oxygen Intubation Type: IV induction Ventilation: Mask ventilation without difficulty Laryngoscope Size: Glidescope and 3 Grade View: Grade I Tube type: Oral Tube size: 7.0 mm Number of attempts: 1 Airway Equipment and Method: Patient positioned with wedge pillow,  Stylet and Video-laryngoscopy Placement Confirmation: ETT inserted through vocal cords under direct vision,  positive ETCO2,  CO2 detector and breath sounds checked- equal and bilateral Secured at: 21 cm Tube secured with: Tape Dental Injury: Teeth and Oropharynx as per pre-operative assessment

## 2016-08-30 NOTE — Transfer of Care (Signed)
Immediate Anesthesia Transfer of Care Note  Patient: Rachel Norris  Procedure(s) Performed: Procedure(s) with comments: [place] (N/A) - hysteroscopic myomectomy. BMI 53.8 HYSTEROSCOPIC MYOMECTOMY (N/A)  Patient Location: PACU  Anesthesia Type:General  Level of Consciousness: awake, alert , oriented and patient cooperative  Airway & Oxygen Therapy: Patient Spontanous Breathing and Patient connected to nasal cannula oxygen  Post-op Assessment: Report given to RN and Post -op Vital signs reviewed and stable  Post vital signs: Reviewed and stable  Last Vitals:  Vitals:   08/30/16 0614 08/30/16 0636  BP: (!) 170/99 (!) 147/92  Pulse: 70   Resp: 20   Temp: 36.8 C     Last Pain:  Vitals:   08/30/16 0614  TempSrc: Oral  PainSc: 2       Patients Stated Pain Goal: 4 ([id number])  Complications: No apparent anesthesia complications

## 2016-08-30 NOTE — H&P (View-Only) (Signed)
GYNECOLOGY  VISIT   HPI: 55 y.o.   Married  Caucasian  female   [place] with Patient's last menstrual period was 08/11/2016.   here for surgery consult. She is scheduled on 08-30-16 for [facility] hysteroscopy with myosure.  She has had abnormal uterine bleeding. Improved the last 2 months. She had a sonohysterogram a few months ago and had a 2 cm intracavitary myoma and an otherwise thin stripe. Blood work c/w menopause.  Normal pap in 3/17.  Not sexually active.  She c/o vulvar itching off and on for a couple of weeks, mild. No abnormal d/c, no odor.  She c/o urinary frequency, drinking more water. Voiding normal amounts. No dysuria, some urgency to void.    GYNECOLOGIC HISTORY: Patient's last menstrual period was 08/11/2016. Contraception:none Menopausal hormone therapy: none         OB History    Gravida Para Term Preterm AB Living   0 0 0 0 0 0   SAB TAB Ectopic Multiple Live Births   0 0 0 0           Patient Active Problem List   Diagnosis Date Noted  . Encounter for screening colonoscopy   . PCP NOTES >>> 10/07/2015  . Fatigue 07/12/2015  . Diabetes ([place]) 07/27/2013  . Annual physical exam 04/24/2012  . Pain in both feet 03/14/2012  . Nail lesion 03/14/2012  . EXOGENOUS OBESITY 08/29/2009  . Asthma 06/22/2008  . ANXIETY 03/07/2007  . Essential hypertension 03/07/2007  . ALLERGIC RHINITIS 03/07/2007  Diet controlled diabetes. Mild asthma, uses her inhaler 2 x a year  Past Medical History:  Diagnosis Date  . Allergic rhinitis   . Anxiety   . Anxiety and depression   . Arthritis    generalized arthritis -knees, feet.back.   . 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 At risk for sleep apnea    STOP-BANG= 5            SENT TO PCP 04-05-2015  . Bronchitis    Bronchitis a few months ago- no issues now. - no Inhalers used daily.  . Depression   . Diabetes mellitus without complication ([place])    "pre diabetes"  . Fibroid   . GERD (gastroesophageal reflux disease)    . Hormone disorder   . Hypertension   . Irregular menstrual bleeding   . Labial cyst    bilateral icclusion  . Perimenopausal   . PONV (postoperative nausea and vomiting)   . Prediabetes   . Thickened endometrium   . Wears glasses     Past Surgical History:  Procedure Laterality Date  . COLONOSCOPY WITH PROPOFOL N/A 05/15/2016   Procedure: COLONOSCOPY WITH PROPOFOL;  Surgeon: Mauri Pole, MD;  Location: [facility];  Service: Endoscopy;  Laterality: N/A;  . DILATION AND CURETTAGE OF UTERUS    . EAR CYST EXCISION Bilateral 04/08/2015   Procedure: removal of bilateral labial inclusion cysts;  Surgeon: Linda Hedges, DO;  Location: [place];  Service: Gynecology;  Laterality: Bilateral;  . HYSTEROSCOPY    . HYSTEROSCOPY W/D&C N/A 04/08/2015   Procedure: DILATATION AND CURETTAGE /HYSTEROSCOPY, removal of bilateral labial inclusion cysts;  Surgeon: Linda Hedges, DO;  Location: [place];  Service: Gynecology;  Laterality: N/A;  . LASER ABLATION OF THE CERVIX  1992   DYSPLAGIA  . 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 acetaminophen (TYLENOL) 500 MG tablet Take 1,000 mg by mouth every 6 (six) hours as needed (For pain.).     Marland Kitchen albuterol (VENTOLIN HFA) 108 (90 Base) MCG/ACT inhaler Inhale 1 puff into the lungs every 4 (four) hours as needed for wheezing or shortness of breath.     . cetirizine (ALL DAY ALLERGY) 10 MG tablet Take 10 mg by mouth at bedtime as needed for allergies.     . fluticasone (FLONASE) 50 MCG/ACT nasal spray Place 2 sprays into both nostrils daily as needed for allergies.    . hydrochlorothiazide (HYDRODIURIL) 25 MG tablet Take 1 tablet (25 mg total) by mouth daily. 90 tablet 1  . Menthol, Topical Analgesic, (BENGAY EX) Apply 1 application topically  daily as needed (For pain.).    Marland Kitchen Misc Natural Products (OSTEO BI-FLEX TRIPLE STRENGTH) TABS Take 2 tablets by mouth every morning.    . Multiple Vitamin (MULTIVITAMIN WITH MINERALS) TABS tablet Take 1 tablet by mouth daily.    . naproxen sodium (ALEVE) 220 MG tablet Take 440 mg by mouth every 8 (eight) hours as needed (For pain.).    Marland Kitchen omeprazole (PRILOSEC) 40 MG capsule Take 40 mg by mouth daily as needed (For heartburn or acid reflux.).     Marland Kitchen valsartan (DIOVAN) 160 MG tablet Take 1 tablet (160 mg total) by mouth daily. 90 tablet 1   No current facility-administered medications for this visit.      ALLERGIES: Sulfa antibiotics  Family History  Problem Relation Age of Onset  . Diabetes Mother     M anmd others  . Dementia Mother   . Stroke Mother   . Stroke Father   . AAA (abdominal aortic aneurysm) Father   . Colon cancer Neg Hx   . Breast cancer Neg Hx   . Coronary artery disease Neg Hx     Social History   Social History  . Marital status: Married    Spouse name: N/A  . Number of children: 0  . Years of education: N/A   Occupational History  . scrub at the OR in [employer]   Social History Main Topics  . Smoking status: Former Smoker    Packs/day: 1.00    Years: 20.00    Types: Cigarettes    Quit date: 04/04/1996  . Smokeless tobacco: Never Used  . Alcohol use 0.0 oz/week     Comment: rare 6 per year  . Drug use: No  . Sexual activity: No   Other Topics Concern  . Not on file   Social History Narrative   Lives w/ husband           Review of Systems  Constitutional: Negative.   HENT: Negative.   Eyes: Negative.   Respiratory: Negative.   Cardiovascular: Negative.   Gastrointestinal: Negative.   Genitourinary: Positive for frequency.       Vaginal itching   Musculoskeletal: Positive for myalgias.  Skin: Negative.   Neurological: Negative.   Endo/Heme/Allergies: Negative.   Psychiatric/Behavioral: Negative.     PHYSICAL EXAMINATION:     BP (!) 160/84 (BP Location: Right Arm, Patient Position: Sitting, Cuff Size: Large)   Pulse 80   Resp 16   Wt 276 lb (125.2 kg)   LMP 08/11/2016   BMI 49.68 kg/m     General appearance: alert, cooperative and appears stated age Neck: no adenopathy, supple, symmetrical, trachea midline and thyroid normal to inspection  and palpation Heart: regular rate and rhythm Lungs: CTAB Abdomen: soft, non-tender; bowel sounds normal; no masses,  no organomegaly Lungs: CTAB Extremities: normal, atraumatic, no cyanosis Skin: normal color, texture and turgor, no rashes or lesions Lymph: normal cervical supraclavicular and inguinal nodes Neurologic: grossly normal   Pelvic: External genitalia:  no lesions, minimal erythema              Urethra:  normal appearing urethra with no masses, tenderness or lesions              Bartholins and Skenes: normal                 Vagina: normal appearing vagina with normal color and discharge, no lesions, mild atrophy              Cervix: no lesions               Chaperone was present for exam.  Wet prep: no clue, no trich, ++ wbc KOH: no yeast PH: 5   ASSESSMENT Abnormal uterine bleeding, unclear if perimenopausal or PMP (she has never gone a year without bleeding, but had a thin stripe, PMP labs and an intracavitary mass that is likely bleeding) Intracavitary bleeding Menopausal lab work Genital pruritus/vulvitis     PLAN Plan: hysteroscopy, myomectomy, dilation and curettage. Reviewed risks, including: bleeding, infection, uterine perforation, fluid overload, need for further sugery Wet prep probe sent Treat with steroid ointment    An After Visit Summary was printed and given to the patient.

## 2016-08-30 NOTE — Op Note (Signed)
Preoperative Diagnosis: abnormal uterine bleeding, intracavitary fibroid  Postoperative Diagnosis: same  Procedure: Hysteroscopic myomectomy, dilation and curettage  Surgeon: Dr Sumner Boast  Assistants: None  Anesthesia: GET  EBL: 5 cc  Fluids: 1,000 cc LR  Fluid deficit: 285 cc  Urine output: 100 cc  Indications for surgery: The patient is a 55 yo female, who presented with abnormal uterine bleeding. Work up included a sonohysterogram with a 2 cm intracavitary fibroid, otherwise thin endometrium. Blood work was in the postmenopausal range.  The risks of the surgery were reviewed with the patient and the consent form was signed prior to her surgery.  Findings: hysteroscopy: 2 cm intracavitary myoma, otherwise thin endometrium, normal tubal ostia bilaterally  Specimens: fibroid, endocervical curettage, endometrial curettage   Procedure: The patient was taken to the operating room with an IV in place. She was placed in the dorsal lithotomy position and anesthesia was administered. She was prepped and draped in the usual sterile fashion for a vaginal procedure. She was straight catheterized.. A weighted speculum was placed in the vagina and a single tooth tenaculum was placed on the anterior lip of the cervix. The cervix was dilated to a # 8 hagar dilator. The uterus was sounded to 9  cm. Vasopressin in a mixture of 20 units vasopressin with 100 cc normal saline was injected into the 4 quadrants of her cervix. Care was taken to make sure the injections were not intravascular. A total of 20 cc was used. The larger myosure hysteroscope was inserted into the uterine cavity. With continuous infusion of normal saline, the uterine cavity was visualized with the above findings. The myosure XL resectoscope was inserted into the uterine cavity and the myomectomy was performed. The cavity was empty at the end of the resection. The myosure was then removed. An endocervical curettage was obtained.The  endometrial cavity was then curetted with the small sharp curette. The cavity had the characteristically gritty texture at the end of the procedure. The curette and the single tooth tenaculum were removed. The speculum was removed. The patients perineum was cleansed of betadine and she was taken out of the dorsal lithotomy position.  Upon awakening she was extubated and transferred to the recovery room in stable and awake condition.  The sponge and instrument count were correct. There were no complications.

## 2016-08-30 NOTE — Anesthesia Postprocedure Evaluation (Signed)
Anesthesia Post Note  Patient: Francena Orecchio  Procedure(s) Performed: Procedure(s) (LRB): DILATATION & CURETTAGE/HYSTEROSCOPY WITH MYOSURE (N/A) HYSTEROSCOPIC MYOMECTOMY (N/A)  Patient location during evaluation: PACU Anesthesia Type: General Level of consciousness: awake and alert Pain management: pain level controlled Vital Signs Assessment: post-procedure vital signs reviewed and stable Respiratory status: spontaneous breathing, nonlabored ventilation, respiratory function stable and patient connected to nasal cannula oxygen Cardiovascular status: blood pressure returned to baseline and stable Postop Assessment: no signs of nausea or vomiting Anesthetic complications: no    Last Vitals:  Vitals:   08/30/16 0830 08/30/16 0845  BP: 115/67 125/80  Pulse: 68 70  Resp: 14 10  Temp:      Last Pain:  Vitals:   08/30/16 0900  TempSrc:   PainSc: 3                  Lakia Gritton S

## 2016-08-31 ENCOUNTER — Ambulatory Visit (INDEPENDENT_AMBULATORY_CARE_PROVIDER_SITE_OTHER): Payer: BLUE CROSS/BLUE SHIELD | Admitting: Family Medicine

## 2016-08-31 ENCOUNTER — Encounter: Payer: Self-pay | Admitting: Family Medicine

## 2016-08-31 VITALS — BP 120/66 | HR 93 | Temp 98.2°F | Ht 62.5 in | Wt 311.2 lb

## 2016-08-31 DIAGNOSIS — I10 Essential (primary) hypertension: Secondary | ICD-10-CM | POA: Diagnosis not present

## 2016-08-31 DIAGNOSIS — H6982 Other specified disorders of Eustachian tube, left ear: Secondary | ICD-10-CM

## 2016-08-31 DIAGNOSIS — R05 Cough: Secondary | ICD-10-CM

## 2016-08-31 DIAGNOSIS — R059 Cough, unspecified: Secondary | ICD-10-CM

## 2016-08-31 NOTE — Progress Notes (Signed)
Chief Complaint  Patient presents with  . Hypertension    pt states BP has been elevated  . Cough    dry, lungs feel tight-noticed on yest    Subjective Rachel Norris is a 55 y.o. female who presents for hypertension follow up. Has had elevated readings at recent medical encounters not in our office. She does not monitor home blood pressures. She does have access to coworkers checking her BP. She is compliant with medications- Valsartan 160 mg daily and HCTZ 25 mg daily. Patient has these side effects of medication: none  Cough +Hx of asthma. Woke up yesterday with a dry cough. She did have surgery yesterday and was intubated for the procedure. She does have a sore throat and does have a hx of allergies. She did have a sneezing fit yesterday and does admit to some ear pain. No nasal congestion or runny nose, no fevers or sick contacts. She also admits to a dry mouth but had a scopolamine patch on recently.   Past Medical History:  Diagnosis Date  . Allergic rhinitis   . Ankle swelling   . Anxiety and depression   . Arthritis    generalized arthritis -knees, feet.back.   . 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 De Quervain's tenosynovitis, right   . Fibroid   . GERD (gastroesophageal reflux disease)   . Heart palpitations   . Hormone disorder   . Hypertension   . Irregular menstrual bleeding   . Labial cyst    bilateral icclusion  . PONV (postoperative nausea and vomiting)   . Pre-diabetes   . Shortness of breath dyspnea    with exertion, climbing a flight of stairs   Family History  Problem Relation Age of Onset  . Diabetes Mother     M anmd others  . Dementia Mother   . Stroke Mother   . Stroke Father   . AAA (abdominal aortic aneurysm) Father   . Colon cancer Neg Hx   . Breast cancer Neg Hx   . Coronary artery disease Neg Hx      Medications Current Outpatient Prescriptions on File Prior to Visit  Medication Sig Dispense Refill  . acetaminophen  (TYLENOL) 500 MG tablet Take 1,000 mg by mouth every 6 (six) hours as needed (For pain.).     Marland Kitchen albuterol (VENTOLIN HFA) 108 (90 Base) MCG/ACT inhaler Inhale 1 puff into the lungs every 4 (four) hours as needed for wheezing or shortness of breath.     . betamethasone valerate ointment (VALISONE) 0.1 % Apply a pea sized amount bid x 1-2 weeks as needed (Patient not taking: Reported on 08/18/2016) 15 g 0  . cetirizine (ALL DAY ALLERGY) 10 MG tablet Take 10 mg by mouth at bedtime as needed for allergies.     . fluticasone (FLONASE) 50 MCG/ACT nasal spray Place 2 sprays into both nostrils daily as needed for allergies.    . hydrochlorothiazide (HYDRODIURIL) 25 MG tablet Take 1 tablet (25 mg total) by mouth daily. 90 tablet 1  . Menthol, Topical Analgesic, (BENGAY EX) Apply 1 application topically daily as needed (For pain.).    Marland Kitchen Misc Natural Products (OSTEO BI-FLEX TRIPLE STRENGTH) TABS Take 2 tablets by mouth every morning.    . Multiple Vitamin (MULTIVITAMIN WITH MINERALS) TABS tablet Take 1 tablet by mouth daily.    . naproxen sodium (ALEVE) 220 MG tablet Take 440 mg by mouth every 8 (eight) hours as needed (For pain.).    Marland Kitchen  omeprazole (PRILOSEC) 40 MG capsule Take 40 mg by mouth daily as needed (For heartburn or acid reflux.).     Marland Kitchen valsartan (DIOVAN) 160 MG tablet Take 1 tablet (160 mg total) by mouth daily. 90 tablet 1   Allergies Allergies  Allergen Reactions  . Sulfa Antibiotics Hives    Review of Systems HEENT: +ear pain, +sore throat, no nasal congestion Const: No fevers Respiratory:  +cough or shortness of breath Cardiac: No CP  Exam BP 120/66 (BP Location: Left Arm, Patient Position: Sitting, Cuff Size: Large)   Pulse 93   Temp 98.2 F (36.8 C) (Oral)   Ht 5' 2.5" (1.588 m)   Wt (!) 311 lb 3.2 oz (141.2 kg)   LMP 08/27/2016 (Exact Date)   SpO2 98%   BMI 56.01 kg/m  General:  well developed, well nourished, in no apparent distress Skin:  warm, no pallor or  diaphoresis Ears: Canals patent b/l, no D/C or erythema, R TM neg, L TM without erythema or fluid, +retraction Neck: neck supple without adenopathy, thyromegaly, masses, or bruits  Lungs:  clear to auscultation, breath sounds equal bilaterally Cardio:  regular rate and rhythm without murmurs, heart sounds without clicks or rubs, +trace ankle edema Extremities:  no clubbing, cyanosis, or edema, no deformities, no skin discoloration Psych: well oriented with normal range of affect and appropriate judgment/insight  Essential hypertension  Cough  ETD (eustachian tube dysfunction), left  Keep Bp diary and bring to appt. Reverse white coat syndrome is a possibility. Cough likely related to irritation from intubation given normal exam and lack of URI symptoms. Recommended daily steroid nasal spray for allergy symptoms and retracted TM- likely ETD. Let us know if symptoms worsen or fail to improve. F/u in 4 weeks with Dr. Larose Kells or me to recheck BP. The patient voiced understanding and agreement to the plan.  Crosby Oyster [place]

## 2016-08-31 NOTE — Patient Instructions (Addendum)
Flonase (fluticasone) is an OTC nasal spray that may help with congestion and ear discomfort. 2 sprays each nostril every day, once a day. Aim towards your same side eye.  Keep a log of your blood pressures over the next 4 weeks. Bring this log to your follow up appointment in around 4 weeks.  Keep taking deep breaths and moving about.

## 2016-08-31 NOTE — Progress Notes (Signed)
Pre visit review using our clinic review tool, if applicable. No additional management support is needed unless otherwise documented below in the visit note. 

## 2016-09-02 ENCOUNTER — Encounter (HOSPITAL_COMMUNITY): Payer: Self-pay | Admitting: Obstetrics and Gynecology

## 2016-09-10 ENCOUNTER — Ambulatory Visit (INDEPENDENT_AMBULATORY_CARE_PROVIDER_SITE_OTHER): Payer: BLUE CROSS/BLUE SHIELD | Admitting: Obstetrics and Gynecology

## 2016-09-10 ENCOUNTER — Encounter: Payer: Self-pay | Admitting: Obstetrics and Gynecology

## 2016-09-10 VITALS — BP 130/80 | HR 76 | Resp 16 | Ht 62.25 in | Wt 307.8 lb

## 2016-09-10 DIAGNOSIS — Z9889 Other specified postprocedural states: Secondary | ICD-10-CM

## 2016-09-10 MED ORDER — MEDROXYPROGESTERONE ACETATE 5 MG PO TABS: ORAL_TABLET | ORAL | 1 refills | Status: DC

## 2016-09-10 NOTE — Progress Notes (Signed)
GYNECOLOGY  VISIT   HPI: 55 y.o.   Married  Caucasian  female   G0P0000 with Patient's last menstrual period was 08/27/2016 (exact date).   here for follow up of D&C Hysteroscopy myomectomy. Doing well, had slight spotting. Feeling well.   GYNECOLOGIC HISTORY: Patient's last menstrual period was 08/27/2016 (exact date). Contraception: Condoms Menopausal hormone therapy: None        OB History    Gravida Para Term Preterm AB Living   0 0 0 0 0 0   SAB TAB Ectopic Multiple Live Births   0 0 0 0           Patient Active Problem List   Diagnosis Date Noted  . Encounter for screening colonoscopy   . PCP NOTES >>> 10/07/2015  . Fatigue 07/12/2015  . Diabetes ([place]) 07/27/2013  . Annual physical exam 04/24/2012  . Pain in both feet 03/14/2012  . Nail lesion 03/14/2012  . EXOGENOUS OBESITY 08/29/2009  . Asthma 06/22/2008  . ANXIETY 03/07/2007  . Essential hypertension 03/07/2007  . ALLERGIC RHINITIS 03/07/2007    Past Medical History:  Diagnosis Date  . Allergic rhinitis   . Ankle swelling   . Anxiety   . Anxiety and depression   . Arthritis    generalized arthritis -knees, feet.back.   . 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 At risk for sleep apnea    STOP-BANG= 5            SENT TO PCP 04-05-2015  . Bronchitis    Bronchitis a few months ago- no issues now. - no Inhalers used daily.  Tennis Must Quervain's tenosynovitis, right   . Depression   . Diabetes mellitus without complication ([place])    "pre diabetes"  . Fibroid   . GERD (gastroesophageal reflux disease)   . Heart palpitations   . Hormone disorder   . Hypertension   . Irregular menstrual bleeding   . Labial cyst    bilateral icclusion  . Perimenopausal   . PONV (postoperative nausea and vomiting)   . Pre-diabetes   . Prediabetes   . Shortness of breath dyspnea    with exertion, climbing a flight of stairs  . Thickened endometrium   . Wears glasses     Past Surgical History:  Procedure Laterality  Date  . COLONOSCOPY WITH PROPOFOL N/A 05/15/2016   Procedure: COLONOSCOPY WITH PROPOFOL;  Surgeon: Mauri Pole, MD;  [facility]: [facility];  Service: Endoscopy;  Laterality: N/A;  . DILATATION & CURETTAGE/HYSTEROSCOPY WITH MYOSURE N/A 08/30/2016   Procedure: DILATATION & CURETTAGE/HYSTEROSCOPY WITH MYOSURE;  Surgeon: Salvadore Dom, MD;  Location: [facility];  Service: Gynecology;  Laterality: N/A;  hysteroscopic myomectomy. BMI 53.8  . DILATION AND CURETTAGE OF UTERUS    . EAR CYST EXCISION Bilateral 04/08/2015   Procedure: removal of bilateral labial inclusion cysts;  Surgeon: Linda Hedges, DO;  Location: [place];  Service: Gynecology;  Laterality: Bilateral;  . HYSTEROSCOPY    . HYSTEROSCOPY W/D&C N/A 04/08/2015   Procedure: DILATATION AND CURETTAGE /HYSTEROSCOPY, removal of bilateral labial inclusion cysts;  Surgeon: Linda Hedges, DO;  Location: [place];  Service: Gynecology;  Laterality: N/A;  . LASER ABLATION OF THE CERVIX  1992   DYSPLAGIA  . MYOMECTOMY N/A 08/30/2016   Procedure: HYSTEROSCOPIC MYOMECTOMY;  Surgeon: Salvadore Dom, MD;  Location: [facility];  Service: Gynecology;  Laterality: N/A;  . 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 acetaminophen (TYLENOL) 500 MG tablet Take 1,000 mg by mouth every 6 (six) hours as needed (For pain.).     Marland Kitchen albuterol (VENTOLIN HFA) 108 (90 Base) MCG/ACT inhaler Inhale 1 puff into the lungs every 4 (four) hours as needed for wheezing or shortness of breath.     . cetirizine (ALL DAY ALLERGY) 10 MG tablet Take 10 mg by mouth at bedtime as needed for allergies.     . fluticasone (FLONASE) 50 MCG/ACT nasal spray Place 2 sprays into both nostrils daily as needed for allergies.    . hydrochlorothiazide (HYDRODIURIL) 25 MG tablet  Take 1 tablet (25 mg total) by mouth daily. 90 tablet 1  . Menthol, Topical Analgesic, (BENGAY EX) Apply 1 application topically daily as needed (For pain.).    Marland Kitchen Misc Natural Products (OSTEO BI-FLEX TRIPLE STRENGTH) TABS Take 2 tablets by mouth every morning.    . Multiple Vitamin (MULTIVITAMIN WITH MINERALS) TABS tablet Take 1 tablet by mouth daily.    . naproxen sodium (ALEVE) 220 MG tablet Take 440 mg by mouth every 8 (eight) hours as needed (For pain.).    Marland Kitchen omeprazole (PRILOSEC) 40 MG capsule Take 40 mg by mouth daily as needed (For heartburn or acid reflux.).     Marland Kitchen valsartan (DIOVAN) 160 MG tablet Take 1 tablet (160 mg total) by mouth daily. 90 tablet 1  . medroxyPROGESTERone (PROVERA) 5 MG tablet 1 tablet po qd x 5 days every other month if no spontaneous menses. Start on November 1st 15 tablet 1   No current facility-administered medications for this visit.      ALLERGIES: Sulfa antibiotics  Family History  Problem Relation Age of Onset  . Diabetes Mother     M anmd others  . Dementia Mother   . Stroke Mother   . Stroke Father   . AAA (abdominal aortic aneurysm) Father   . Colon cancer Neg Hx   . Breast cancer Neg Hx   . Coronary artery disease Neg Hx     Social History   Social History  . Marital status: Married    Spouse name: N/A  . Number of children: 0  . Years of education: N/A   Occupational History  . scrub at the OR in [employer]   Social History Main Topics  . Smoking status: Former Smoker    Packs/day: 1.00    Years: 20.00    Types: Cigarettes    Quit date: 04/04/1996  . Smokeless tobacco: Never Used  . Alcohol use 0.0 oz/week     Comment: rare 6 per year  . Drug use: No  . Sexual activity: No   Other Topics Concern  . Not on file   Social History Narrative   Lives w/ husband           Review of Systems  Constitutional: Negative.   HENT: Negative.   Eyes: Negative.   Respiratory: Negative.   Cardiovascular: Negative.    Gastrointestinal: Negative.   Genitourinary: Negative.   Musculoskeletal: Negative.   Skin: Negative.   Neurological: Negative.   Endo/Heme/Allergies: Negative.   Psychiatric/Behavioral: Negative.     PHYSICAL EXAMINATION:    BP 130/80 (BP Location: Right Arm, Patient Position: Sitting, Cuff Size: Large)   Pulse 76   Resp 16   Ht 5' 2.25" (1.581 m)  Wt (!) 307 lb 12.8 oz (139.6 kg)   LMP 08/27/2016 (Exact Date)   BMI 55.85 kg/m     General appearance: alert, cooperative and appears stated age Abdomen: soft, non-tender; no masses,  no organomegaly   ASSESSMENT S/P hysteroscopic myomectomy Peri vs postmenopausal, PMP hormone levels, irregular bleeding may have been from the myoma    PLAN Will treat provera cyclically  She will call with or without bleeding.  F/U for an annual in 4/18   An After Visit Summary was printed and given to the patient.

## 2016-10-04 ENCOUNTER — Other Ambulatory Visit: Payer: Self-pay | Admitting: Internal Medicine

## 2016-11-16 ENCOUNTER — Other Ambulatory Visit: Payer: Self-pay | Admitting: Obstetrics and Gynecology

## 2016-11-16 DIAGNOSIS — Z1231 Encounter for screening mammogram for malignant neoplasm of breast: Secondary | ICD-10-CM

## 2016-11-19 ENCOUNTER — Ambulatory Visit (HOSPITAL_BASED_OUTPATIENT_CLINIC_OR_DEPARTMENT_OTHER)
Admission: RE | Admit: 2016-11-19 | Discharge: 2016-11-19 | Disposition: A | Discharge status: Home / Self Care | Payer: BLUE CROSS/BLUE SHIELD | Source: Ambulatory Visit | Attending: Obstetrics and Gynecology | Admitting: Obstetrics and Gynecology

## 2016-11-19 DIAGNOSIS — Z1231 Encounter for screening mammogram for malignant neoplasm of breast: Secondary | ICD-10-CM | POA: Diagnosis not present

## 2016-12-19 LAB — HM DIABETES EYE EXAM

## 2017-01-18 ENCOUNTER — Encounter: Payer: Self-pay | Admitting: Internal Medicine

## 2017-04-03 ENCOUNTER — Ambulatory Visit (INDEPENDENT_AMBULATORY_CARE_PROVIDER_SITE_OTHER): Payer: BLUE CROSS/BLUE SHIELD | Admitting: Obstetrics and Gynecology

## 2017-04-03 ENCOUNTER — Encounter: Payer: Self-pay | Admitting: Obstetrics and Gynecology

## 2017-04-03 VITALS — BP 138/80 | HR 64 | Resp 16 | Ht 62.5 in | Wt 310.0 lb

## 2017-04-03 DIAGNOSIS — R3915 Urgency of urination: Secondary | ICD-10-CM | POA: Diagnosis not present

## 2017-04-03 DIAGNOSIS — N762 Acute vulvitis: Secondary | ICD-10-CM

## 2017-04-03 DIAGNOSIS — Z01419 Encounter for gynecological examination (general) (routine) without abnormal findings: Secondary | ICD-10-CM | POA: Diagnosis not present

## 2017-04-03 DIAGNOSIS — L309 Dermatitis, unspecified: Secondary | ICD-10-CM

## 2017-04-03 LAB — POCT URINALYSIS DIPSTICK
Bilirubin, UA: NEGATIVE
Blood, UA: NEGATIVE
Glucose, UA: NEGATIVE
KETONES UA: NEGATIVE
LEUKOCYTES UA: NEGATIVE
Nitrite, UA: NEGATIVE
PH UA: 5 (ref 5.0–8.0)
PROTEIN UA: NEGATIVE
Urobilinogen, UA: NEGATIVE (ref ?–2.0)

## 2017-04-03 MED ORDER — BETAMETHASONE VALERATE 0.1 % EX OINT
TOPICAL_OINTMENT | CUTANEOUS | 0 refills | Status: DC
Start: 2017-04-03 — End: 2018-04-10

## 2017-04-03 NOTE — Patient Instructions (Signed)

## 2017-04-03 NOTE — Progress Notes (Signed)
56 y.o. G0P0000 MarriedCaucasianF here for annual exam.  S/P hysteroscopic myomectomy in the fall. No real cycle since the fall. She never took the cyclic provera. Few episodes of spotting in the fall/winter, none since 12/17. Occasional hot flashes, not every week. No night sweats (used to). No vaginal dryness. She c/o intermittent vulvar itching for the last week, no abnormal d/c.  She was having urinary frequency and urgency a few weeks ago, slight increase in frequency of urination today.  Not sexually active.     Patient's last menstrual period was 09/15/2016.          Sexually active: No.  The current method of family planning is abstinence.    Exercising: No.  The patient does not participate in regular exercise at present. Smoker:  no  Health Maintenance: Pap:  03-26-16 WNL NEG HR HPV  History of abnormal Pap:  yes laser surgery years ago  MMG:  11-19-16 WNL  Colonoscopy:  5-16-17WNL  BMD:   Never TDaP:  2012  Gardasil: N/A   reports that she quit smoking about 21 years ago. Her smoking use included Cigarettes. She has a 20.00 pack-year smoking history. She has never used smokeless tobacco. She reports that she drinks alcohol. She reports that she does not use drugs. She drinks 2 drinks a month. 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 Allergic rhinitis   . Ankle swelling   . Anxiety   . Anxiety and depression   . Arthritis    generalized arthritis -knees, feet.back.   . 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 At risk for sleep apnea    STOP-BANG= 5            SENT TO PCP 04-05-2015  . Bronchitis    Bronchitis a few months ago- no issues now. - no Inhalers used daily.  Tennis Must Quervain's tenosynovitis, right   . Depression   . Diabetes mellitus without complication ([place])    "pre diabetes"  . Fibroid   . GERD (gastroesophageal reflux disease)   . Heart palpitations   . Hormone disorder   . Hypertension   . Irregular menstrual bleeding    . Labial cyst    bilateral icclusion  . Perimenopausal   . PONV (postoperative nausea and vomiting)   . Pre-diabetes   . Prediabetes   . Shortness of breath dyspnea    with exertion, climbing a flight of stairs  . Thickened endometrium   . Wears glasses     Past Surgical History:  Procedure Laterality Date  . COLONOSCOPY WITH PROPOFOL N/A 05/15/2016   Procedure: COLONOSCOPY WITH PROPOFOL;  Surgeon: Mauri Pole, MD;  [facility];  Service: Endoscopy;  Laterality: N/A;  . DILATATION & CURETTAGE/HYSTEROSCOPY WITH MYOSURE N/A 08/30/2016   Procedure: DILATATION & CURETTAGE/HYSTEROSCOPY WITH MYOSURE;  Surgeon: Salvadore Dom, MD;  Location: [facility];  Service: Gynecology;  Laterality: N/A;  hysteroscopic myomectomy. BMI 53.8  . DILATION AND CURETTAGE OF UTERUS    . EAR CYST EXCISION Bilateral 04/08/2015   Procedure: removal of bilateral labial inclusion cysts;  Surgeon: Linda Hedges, DO;  Location: [place];  Service: Gynecology;  Laterality: Bilateral;  . HYSTEROSCOPY    . HYSTEROSCOPY W/D&C N/A 04/08/2015   Procedure: DILATATION AND CURETTAGE /HYSTEROSCOPY, removal of bilateral labial inclusion cysts;  Surgeon: Linda Hedges, DO;  Location: [place] [facility];  Service: Gynecology;  Laterality: N/A;  .  LASER ABLATION OF THE CERVIX  1992   DYSPLAGIA  . MYOMECTOMY N/A 08/30/2016   Procedure: HYSTEROSCOPIC MYOMECTOMY;  Surgeon: Salvadore Dom, MD;  Location: [facility];  Service: Gynecology;  Laterality: N/A;  . 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 acetaminophen (TYLENOL) 500 MG tablet Take 1,000 mg by mouth every 6 (six) hours as needed (For pain.).     Marland Kitchen albuterol (VENTOLIN HFA) 108 (90 Base) MCG/ACT inhaler Inhale 1 puff into the lungs every 4 (four) hours as  needed for wheezing or shortness of breath.     Marland Kitchen b complex vitamins tablet Take 1 tablet by mouth daily.    . fluticasone (FLONASE) 50 MCG/ACT nasal spray Place 2 sprays into both nostrils daily as needed for allergies.    . hydrochlorothiazide (HYDRODIURIL) 25 MG tablet Take 1 tablet (25 mg total) by mouth daily. 90 tablet 2  . Menthol, Topical Analgesic, (BENGAY EX) Apply 1 application topically daily as needed (For pain.).    Marland Kitchen Multiple Vitamin (MULTIVITAMIN WITH MINERALS) TABS tablet Take 1 tablet by mouth daily.    . naproxen sodium (ALEVE) 220 MG tablet Take 440 mg by mouth every 8 (eight) hours as needed (For pain.).    Marland Kitchen omeprazole (PRILOSEC) 40 MG capsule Take 40 mg by mouth daily as needed (For heartburn or acid reflux.).     Marland Kitchen Turmeric 500 MG CAPS Take 500 mg by mouth 2 (two) times daily.    . valsartan (DIOVAN) 160 MG tablet Take 1 tablet (160 mg total) by mouth daily. 90 tablet 2   No current facility-administered medications for this visit.     Family History  Problem Relation Age of Onset  . Diabetes Mother     M anmd others  . Dementia Mother   . Stroke Mother   . Stroke Father   . AAA (abdominal aortic aneurysm) Father   . Colon cancer Neg Hx   . Breast cancer Neg Hx   . Coronary artery disease Neg Hx     Review of Systems  Constitutional: Negative.   HENT: Negative.   Eyes: Negative.   Respiratory: Negative.   Cardiovascular: Negative.   Gastrointestinal: Negative.   Endocrine: Negative.   Genitourinary: Positive for urgency.       Vaginal itching   Musculoskeletal: Negative.   Skin: Negative.   Allergic/Immunologic: Negative.   Neurological: Negative.   Psychiatric/Behavioral: Negative.     Exam:   BP 138/80 (BP Location: Right Arm, Patient Position: Sitting, Cuff Size: Normal)   Pulse 64   Resp 16   Ht 5' 2.5" (1.588 m)   Wt (!) 310 lb (140.6 kg)   LMP 09/15/2016   BMI 55.80 kg/m   Weight change: @WEIGHTCHANGE @ Height:   Height: 5' 2.5" (158.8  cm)  Ht Readings from Last 3 Encounters:  04/03/17 5' 2.5" (1.588 m)  09/10/16 5' 2.25" (1.581 m)  08/31/16 5' 2.5" (1.588 m)    General appearance: alert, cooperative and appears stated age Head: Normocephalic, without obvious abnormality, atraumatic Neck: no adenopathy, supple, symmetrical, trachea midline and thyroid normal to inspection and palpation Lungs: clear to auscultation bilaterally Cardiovascular: regular rate and rhythm Breasts: normal appearance, no masses or tenderness Abdomen: soft, non-tender; bowel sounds normal; no masses,  no organomegaly Extremities: extremities normal, atraumatic, no cyanosis or edema Skin: Skin  color, texture, turgor normal. No rashes or lesions Lymph nodes: Cervical, supraclavicular, and axillary nodes normal. No abnormal inguinal nodes palpated Neurologic: Grossly normal   Pelvic: External genitalia:  no lesions              Urethra:  normal appearing urethra with no masses, tenderness or lesions              Bartholins and Skenes: normal                 Vagina: normal appearing vagina with normal color and discharge, no lesions              Cervix: no lesions               Bimanual Exam:  Uterus:  limited by BMI              Adnexa: no mass, fullness, tenderness               Rectovaginal: Confirms               Anus:  normal sphincter tone, no lesions  Chaperone was present for exam.  Urine dip normal  A:  Well Woman with normal exam  Intermittent urinary frequency, on a diuretic, normal urinary dip  Vulvar pruritis, perianal irritation  Obesity, discussed weight loss, will give information on weight loss clinic  P:   No pap this year  Mammogram and colonoscopy UTD  Discussed breast self exam  Discussed calcium and vit D intake  Screening labs with primary MD  Wet prep probe  Call with any bleeding  Vaginitis panel sent  Discussed vulvar and perianal skin care  Steroid ointment to vulva and perianal area for the next 1-2  weeks, then needs to switch to Vaseline or A&D ointment  Call with further bleeding

## 2017-04-04 LAB — WET PREP BY MOLECULAR PROBE
CANDIDA SPECIES: NOT DETECTED
GARDNERELLA VAGINALIS: NOT DETECTED
Trichomonas vaginosis: NOT DETECTED

## 2017-04-19 ENCOUNTER — Telehealth: Payer: Self-pay | Admitting: Obstetrics and Gynecology

## 2017-04-19 NOTE — Telephone Encounter (Signed)
Spoke with patient. Patient requesting pap results from 04/03/17 AEX. Advised patient no pap taken on 04/03/17, last pap 03/26/16 -normal. Patient states she was not sure if she had one or not, no problems, just wanted to make sure with her abnormal pap history in the 90's that nothing was missed. Patient denies any problems at this time. Advised patient can schedule OV for pap if she desires or will repeat in 3 years. Patient states no need to schedule for pap, just wanted to make sure she did not miss anything. Advised patient Dr. Talbert Nan is out of the office today, can review when she returns on 4/23 and return call with any additional recommendations, patient is agreeable.    Dr. Talbert Nan, any additional recommendations?

## 2017-04-19 NOTE — Telephone Encounter (Signed)
Patient requesting her PAP results.

## 2017-04-20 NOTE — Telephone Encounter (Signed)
The patient hasn't had an abnormal pap for many years. Negative pap and negative hpv from 3/17, my recommendation would be to recheck in 3/20 (guidelines say she could actually go until 3/22). No further recommendations.

## 2017-04-22 NOTE — Telephone Encounter (Signed)
Left detailed message, ok per current dpr, advised as seen below per Dr. Talbert Nan. Advised to return call to office at [phone] for any additional questions.  Routing to provider for final review. Patient is agreeable to disposition. Will close encounter.

## 2017-06-17 ENCOUNTER — Other Ambulatory Visit: Payer: Self-pay

## 2017-06-17 ENCOUNTER — Telehealth: Payer: Self-pay | Admitting: Gastroenterology

## 2017-06-17 MED ORDER — OMEPRAZOLE 40 MG PO CPDR: 40.0000 mg | DELAYED_RELEASE_CAPSULE | Freq: Every day | ORAL | 0 refills | Status: DC | PRN

## 2017-06-17 NOTE — Telephone Encounter (Signed)
I have left her a message to call back to schedule an appointment for evaluation. Do you want her to treat with OTC or prescription PPI until she is seen?

## 2017-06-17 NOTE — Telephone Encounter (Signed)
Ok to send Omeprazole prescription 40mg  daily. Thanks

## 2017-06-17 NOTE — Telephone Encounter (Signed)
Called back to the patient. Again I got her voicemail. I left a message on her voicemail advising her of the prescription for 30 days. Also encouraged to call soon for an appointment.

## 2017-06-18 ENCOUNTER — Ambulatory Visit: Payer: BLUE CROSS/BLUE SHIELD | Admitting: Physician Assistant

## 2017-06-30 ENCOUNTER — Other Ambulatory Visit: Payer: Self-pay | Admitting: Internal Medicine

## 2017-07-04 ENCOUNTER — Other Ambulatory Visit: Payer: Self-pay | Admitting: Internal Medicine

## 2017-07-06 ENCOUNTER — Other Ambulatory Visit: Payer: Self-pay | Admitting: Internal Medicine

## 2017-07-16 ENCOUNTER — Encounter: Payer: Self-pay | Admitting: Internal Medicine

## 2017-07-16 ENCOUNTER — Ambulatory Visit (INDEPENDENT_AMBULATORY_CARE_PROVIDER_SITE_OTHER): Payer: BLUE CROSS/BLUE SHIELD | Admitting: Internal Medicine

## 2017-07-16 VITALS — BP 128/80 | HR 78 | Temp 98.1°F | Resp 14 | Ht 63.0 in | Wt 302.5 lb

## 2017-07-16 DIAGNOSIS — M7989 Other specified soft tissue disorders: Secondary | ICD-10-CM

## 2017-07-16 DIAGNOSIS — E119 Type 2 diabetes mellitus without complications: Secondary | ICD-10-CM | POA: Diagnosis not present

## 2017-07-16 DIAGNOSIS — F419 Anxiety disorder, unspecified: Secondary | ICD-10-CM

## 2017-07-16 DIAGNOSIS — F329 Major depressive disorder, single episode, unspecified: Secondary | ICD-10-CM

## 2017-07-16 DIAGNOSIS — I10 Essential (primary) hypertension: Secondary | ICD-10-CM | POA: Diagnosis not present

## 2017-07-16 MED ORDER — GLUCOSE BLOOD VI STRP: ORAL_STRIP | 12 refills | Status: DC

## 2017-07-16 MED ORDER — HYDROCHLOROTHIAZIDE 25 MG PO TABS: 25.0000 mg | ORAL_TABLET | Freq: Every day | ORAL | 1 refills | Status: DC

## 2017-07-16 MED ORDER — ONETOUCH VERIO FLEX SYSTEM W/DEVICE KIT: 1.0000 | PACK | Freq: Once | 0 refills | Status: AC

## 2017-07-16 MED ORDER — LOSARTAN POTASSIUM 100 MG PO TABS: 100.0000 mg | ORAL_TABLET | Freq: Every day | ORAL | 1 refills | Status: DC

## 2017-07-16 MED ORDER — ONETOUCH ULTRASOFT LANCETS MISC: 12 refills | Status: DC

## 2017-07-16 NOTE — Progress Notes (Signed)
Subjective:    Patient ID: Rachel Norris, female    DOB: 1961/07/22, 56 y.o.   MRN: [id number]  DOS:  07/16/2017 Type of visit - description : rov Interval history: History of DM, request a glucometer. + Stress at work, denies any suicidal ideas. HTN: On valsartan and HCTZ, valsartan recalled. Alternatives?Marland Kitchen. Concern about her left leg being larger than the right, this is a chronic issue however on July 4, she had a fall at home, developed a hematoma at the left leg, area looks better and shrinking. Minimal pain. Patient is still quite concerned about having a DVT.    Morbid obesity:  had + screening for sleep apnea last year, today I asked about snoring, she said no. Only occasionally she feels a sleepy, energy level okay.  Wt Readings from Last 3 Encounters:  07/16/17 (!) 302 lb 8 oz (137.2 kg)  04/03/17 (!) 310 lb (140.6 kg)  09/10/16 (!) 307 lb 12.8 oz (139.6 kg)     Review of Systems Denies chest pain or difficulty breathing No nausea, vomiting, diarrhea. No blood in the stools.   Past Medical History:  Diagnosis Date  . Allergic rhinitis   . Ankle swelling   . Anxiety   . Anxiety and depression   . Arthritis    generalized arthritis -knees, feet.back.   . 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 At risk for sleep apnea    STOP-BANG= 5            SENT TO PCP 04-05-2015  . Bronchitis    Bronchitis a few months ago- no issues now. - no Inhalers used daily.  Tennis Must Quervain's tenosynovitis, right   . Depression   . Diabetes mellitus without complication ([place])    "pre diabetes"  . Fibroid   . GERD (gastroesophageal reflux disease)   . Heart palpitations   . Hormone disorder   . Hypertension   . Irregular menstrual bleeding   . Labial cyst    bilateral icclusion  . Perimenopausal   . PONV (postoperative nausea and vomiting)   . Pre-diabetes   . Prediabetes   . Shortness of breath dyspnea    with exertion, climbing a flight of stairs  . Thickened  endometrium   . Wears glasses     Past Surgical History:  Procedure Laterality Date  . COLONOSCOPY WITH PROPOFOL N/A 05/15/2016   Procedure: COLONOSCOPY WITH PROPOFOL;  Surgeon: Mauri Pole, MD;  [facility];  Service: Endoscopy;  Laterality: N/A;  . DILATATION & CURETTAGE/HYSTEROSCOPY WITH MYOSURE N/A 08/30/2016   Procedure: DILATATION & CURETTAGE/HYSTEROSCOPY WITH MYOSURE;  Surgeon: Salvadore Dom, MD;  Location: [facility];  Service: Gynecology;  Laterality: N/A;  hysteroscopic myomectomy. BMI 53.8  . DILATION AND CURETTAGE OF UTERUS    . EAR CYST EXCISION Bilateral 04/08/2015   Procedure: removal of bilateral labial inclusion cysts;  Surgeon: Linda Hedges, DO;  Location: [facility];  Service: Gynecology;  Laterality: Bilateral;  . HYSTEROSCOPY    . HYSTEROSCOPY W/D&C N/A 04/08/2015   Procedure: DILATATION AND CURETTAGE /HYSTEROSCOPY, removal of bilateral labial inclusion cysts;  Surgeon: Linda Hedges, DO;  Location: [place] [facility];  Service: Gynecology;  Laterality: N/A;  . LASER ABLATION OF THE CERVIX  1992   DYSPLAGIA  . MYOMECTOMY N/A 08/30/2016   Procedure: HYSTEROSCOPIC MYOMECTOMY;  Surgeon: Salvadore Dom, MD;  Location: [facility];  Service: Gynecology;  Laterality: N/A;  . 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 Marital status: Married    Spouse name: N/A  . Number of children: 0  . Years of education: N/A   Occupational History  . 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 Smoking status: Former Smoker    Packs/day: 1.00    Years: 20.00    Types: Cigarettes    Quit date: 04/04/1996  . Smokeless tobacco: Never Used  . Alcohol use 0.0 oz/week     Comment: rare 6 per year  . Drug use: No  . Sexual activity: No   Other Topics Concern  . Not on  file   Social History Narrative   Lives w/ husband             Allergies as of 07/16/2017      Reactions   Sulfa Antibiotics Hives      Medication List       Accurate as of 07/16/17  8:36 PM. Always use your most recent med list.          acetaminophen 500 MG tablet Commonly known as:  TYLENOL Take 1,000 mg by mouth every 6 (six) hours as needed (For pain.).   ALEVE 220 MG tablet Generic drug:  naproxen sodium Take 440 mg by mouth every 8 (eight) hours as needed (For pain.).   b complex vitamins tablet Take 1 tablet by mouth daily.   BENGAY EX Apply 1 application topically daily as needed (For pain.).   betamethasone valerate ointment 0.1 % Commonly known as:  VALISONE Apply a pea sized amount topically BID for 1-2 weeks as needed   fluticasone 50 MCG/ACT nasal spray Commonly known as:  FLONASE Place 2 sprays into both nostrils daily as needed for allergies.   glucose blood test strip Commonly known as:  ONETOUCH VERIO Check blood sugar once daily   hydrochlorothiazide 25 MG tablet Commonly known as:  HYDRODIURIL Take 1 tablet (25 mg total) by mouth daily.   losartan 100 MG tablet Commonly known as:  COZAAR Take 1 tablet (100 mg total) by mouth daily.   multivitamin with minerals Tabs tablet Take 1 tablet by mouth daily.   omeprazole 40 MG capsule Commonly known as:  PRILOSEC Take 1 capsule (40 mg total) by mouth daily as needed (For heartburn or acid reflux.).   onetouch ultrasoft lancets Check blood sugar once daily   ONETOUCH VERIO FLEX SYSTEM w/Device Kit 1 Device by Does not apply route once. Check blood sugar once daily   Turmeric 500 MG Caps Take 500 mg by mouth 2 (two) times daily.   VENTOLIN HFA 108 (90 Base) MCG/ACT inhaler Generic drug:  albuterol Inhale 1 puff into the lungs every 4 (four) hours as needed for wheezing or shortness of breath.          Objective:   Physical Exam  Skin:      BP 128/80 (BP Location: Left Arm,  Patient Position: Sitting, Cuff Size: Normal)   Pulse 78   Temp 98.1 F (36.7 C) (Oral)   Resp 14   Ht 5' 3"  (1.6 m)   Wt (!) 302 lb 8 oz (137.2 kg)   SpO2 98%   BMI 53.59 kg/m  General:   Well developed, morbidly obese appearing. NAD.  HEENT:  Normocephalic . Face symmetric, atraumatic Lungs:  CTA B Normal respiratory effort, no intercostal retractions, no accessory muscle use. Heart: RRR,  no murmur.  No pretibial edema at the left calf is larger by 1 inch in circumference.   Skin: Not pale. Not jaundice Neurologic:  alert & oriented X3.  Speech normal, gait appropriate for age and unassisted Psych--  Cognition and judgment appear intact.  Cooperative with normal attention span and concentration.  Behavior appropriate. No anxious or depressed appearing.      Assessment & Plan:  Assessment  DM HTN Asthma Anxiety, depression Morbid obesity Allergic rhinitis Dyspepsia (prn omeprazole) L calf larger : [place] 02-2016 (-)  Plan  DM : Diet control. Checking a A1c, FLP, will send a glucometer Rx. HTN: valsartan has been recalled. Switch to losartan 100 mg, continue HCTZ. Labs in 2 weeks. Asthma: Note issue at this point Anxiety depression: Under more stress lately at work, denies any need for medications. Denies suicidal ideas Morbid obesity: Diet and exercise discussed, currently with no symptoms of OSA; rec to see the bariatric doctor for further nutrition advice, info provided. Larger left calf: Chronic issue but had a recent injury (see HPI), concern about a clot, we agreed to get the ultrasound.   RTC 2 weeks: Will check a CMP, FLP, CBC, A1c. RTC 3-4 months, CPX

## 2017-07-16 NOTE — Patient Instructions (Signed)
GO TO THE FRONT DESK Schedule labs to be done in 2 weeks, fasting  Schedule your next appointment for a physical exam in 3-4 months    Stop valsartan Start losartan  Check the  blood pressure 2 or 3 times a  Week  Be sure your blood pressure is between 110/65 and  145/85. If it is consistently higher or lower, let me know

## 2017-07-16 NOTE — Progress Notes (Signed)
Pre visit review using our clinic review tool, if applicable. No additional management support is needed unless otherwise documented below in the visit note. 

## 2017-07-16 NOTE — Assessment & Plan Note (Signed)
  DM : Diet control. Checking a A1c, FLP, will send a glucometer Rx. HTN: valsartan has been recalled. Switch to losartan 100 mg, continue HCTZ. Labs in 2 weeks. Asthma: Note issue at this point Anxiety depression: Under more stress lately at work, denies any need for medications. Denies suicidal ideas Morbid obesity: Diet and exercise discussed, currently with no symptoms of OSA; rec to see the bariatric doctor for further nutrition advice, info provided. Larger left calf: Chronic issue but had a recent injury (see HPI), concern about a clot, we agreed to get the ultrasound.   RTC 2 weeks: Will check a CMP, FLP, CBC, A1c. RTC 3-4 months, CPX

## 2017-07-30 ENCOUNTER — Other Ambulatory Visit (INDEPENDENT_AMBULATORY_CARE_PROVIDER_SITE_OTHER): Payer: BLUE CROSS/BLUE SHIELD

## 2017-07-30 DIAGNOSIS — I10 Essential (primary) hypertension: Secondary | ICD-10-CM | POA: Diagnosis not present

## 2017-07-30 DIAGNOSIS — E119 Type 2 diabetes mellitus without complications: Secondary | ICD-10-CM | POA: Diagnosis not present

## 2017-07-30 LAB — COMPREHENSIVE METABOLIC PANEL
ALT: 17 U/L (ref 0–35)
AST: 13 U/L (ref 0–37)
Albumin: 3.9 g/dL (ref 3.5–5.2)
Alkaline Phosphatase: 58 U/L (ref 39–117)
BUN: 19 mg/dL (ref 6–23)
CALCIUM: 9.2 mg/dL (ref 8.4–10.5)
CO2: 31 meq/L (ref 19–32)
CREATININE: 0.52 mg/dL (ref 0.40–1.20)
Chloride: 101 mEq/L (ref 96–112)
GFR: 130.32 mL/min (ref >60.00)
GLUCOSE: 143 mg/dL — AB (ref 70–99)
Potassium: 3.9 mEq/L (ref 3.5–5.1)
Sodium: 138 mEq/L (ref 135–145)
Total Bilirubin: 0.5 mg/dL (ref 0.2–1.2)
Total Protein: 6.8 g/dL (ref 6.0–8.3)

## 2017-07-30 LAB — CBC WITH DIFFERENTIAL/PLATELET
BASOS ABS: 0.1 10*3/uL (ref 0.0–0.1)
Basophils Relative: 1 % (ref 0.0–3.0)
EOS ABS: 0.2 10*3/uL (ref 0.0–0.7)
Eosinophils Relative: 2.6 % (ref 0.0–5.0)
HCT: 41.2 % (ref 36.0–46.0)
Hemoglobin: 13.7 g/dL (ref 12.0–15.0)
LYMPHS ABS: 1.5 10*3/uL (ref 0.7–4.0)
Lymphocytes Relative: 21.6 % (ref 12.0–46.0)
MCHC: 33.3 g/dL (ref 30.0–36.0)
MCV: 97.1 fl (ref 78.0–100.0)
Monocytes Absolute: 0.7 10*3/uL (ref 0.1–1.0)
Monocytes Relative: 9.4 % (ref 3.0–12.0)
NEUTROS ABS: 4.6 10*3/uL (ref 1.4–7.7)
NEUTROS PCT: 65.4 % (ref 43.0–77.0)
PLATELETS: 339 10*3/uL (ref 150.0–400.0)
RBC: 4.24 Mil/uL (ref 3.87–5.11)
RDW: 12.7 % (ref 11.5–15.5)
WBC: 7 10*3/uL (ref 4.0–10.5)

## 2017-07-30 LAB — LIPID PANEL
CHOL/HDL RATIO: 4
Cholesterol: 194 mg/dL (ref 0–200)
HDL: 47.1 mg/dL (ref >39.00)
LDL CALC: 127 mg/dL — AB (ref 0–99)
NonHDL: 147.13
TRIGLYCERIDES: 101 mg/dL (ref 0.0–149.0)
VLDL: 20.2 mg/dL (ref 0.0–40.0)

## 2017-07-30 LAB — HEMOGLOBIN A1C: HEMOGLOBIN A1C: 6.6 % — AB (ref 4.6–6.5)

## 2017-08-01 ENCOUNTER — Telehealth: Payer: Self-pay | Admitting: Internal Medicine

## 2017-08-01 MED ORDER — METFORMIN HCL 500 MG PO TABS: 500.0000 mg | ORAL_TABLET | Freq: Two times a day (BID) | ORAL | 0 refills | Status: DC

## 2017-08-01 NOTE — Telephone Encounter (Signed)
Last A1c 6.6. Okay to start metformin. Metformin 500 mg one tablet twice a day #60 and 3 refills. First 10 days take only one tablet in the morning to prevent side effects such as nausea or diarrhea. Will need blood work when she comes back in November

## 2017-08-01 NOTE — Telephone Encounter (Signed)
Rx sent 

## 2017-08-01 NOTE — Telephone Encounter (Signed)
Pt called in to request to start taking Metformin   [facility] [address].   Pt says that her insurance pays for 90 day supply.   Pt says that her A1C has been high.

## 2017-08-01 NOTE — Telephone Encounter (Signed)
Please advise 

## 2017-08-09 ENCOUNTER — Telehealth: Payer: Self-pay | Admitting: Internal Medicine

## 2017-08-09 MED ORDER — LOSARTAN POTASSIUM 100 MG PO TABS: 100.0000 mg | ORAL_TABLET | Freq: Every day | ORAL | 1 refills | Status: DC

## 2017-08-09 MED ORDER — HYDROCHLOROTHIAZIDE 25 MG PO TABS: 25.0000 mg | ORAL_TABLET | Freq: Every day | ORAL | 1 refills | Status: DC

## 2017-08-09 NOTE — Telephone Encounter (Signed)
Self.    Refill for Pharmacy:hydrochlorothiazide and Losartan  - 90 day supply     [id number] - [place], [place] [address]

## 2017-08-09 NOTE — Telephone Encounter (Signed)
Rxs sent

## 2017-08-22 ENCOUNTER — Ambulatory Visit (HOSPITAL_BASED_OUTPATIENT_CLINIC_OR_DEPARTMENT_OTHER)
Admission: RE | Admit: 2017-08-22 | Discharge: 2017-08-22 | Disposition: A | Discharge status: Home / Self Care | Payer: BLUE CROSS/BLUE SHIELD | Source: Ambulatory Visit | Attending: Internal Medicine | Admitting: Internal Medicine

## 2017-08-22 DIAGNOSIS — M7989 Other specified soft tissue disorders: Secondary | ICD-10-CM | POA: Diagnosis not present

## 2017-08-22 DIAGNOSIS — I872 Venous insufficiency (chronic) (peripheral): Secondary | ICD-10-CM | POA: Diagnosis not present

## 2017-10-28 ENCOUNTER — Other Ambulatory Visit: Payer: Self-pay | Admitting: Internal Medicine

## 2017-11-05 ENCOUNTER — Ambulatory Visit: Payer: BLUE CROSS/BLUE SHIELD | Admitting: Internal Medicine

## 2017-11-05 ENCOUNTER — Encounter: Payer: Self-pay | Admitting: Internal Medicine

## 2017-11-05 VITALS — BP 124/72 | HR 62 | Temp 97.6°F | Resp 14 | Ht 63.0 in | Wt 300.4 lb

## 2017-11-05 DIAGNOSIS — I10 Essential (primary) hypertension: Secondary | ICD-10-CM | POA: Diagnosis not present

## 2017-11-05 DIAGNOSIS — E669 Obesity, unspecified: Secondary | ICD-10-CM

## 2017-11-05 DIAGNOSIS — E119 Type 2 diabetes mellitus without complications: Secondary | ICD-10-CM | POA: Diagnosis not present

## 2017-11-05 LAB — BASIC METABOLIC PANEL
BUN: 22 mg/dL (ref 6–23)
CHLORIDE: 100 meq/L (ref 96–112)
CO2: 30 meq/L (ref 19–32)
CREATININE: 0.69 mg/dL (ref 0.40–1.20)
Calcium: 9.6 mg/dL (ref 8.4–10.5)
GFR: 93.93 mL/min (ref >60.00)
Glucose, Bld: 121 mg/dL — ABNORMAL HIGH (ref 70–99)
Potassium: 3.8 mEq/L (ref 3.5–5.1)
Sodium: 137 mEq/L (ref 135–145)

## 2017-11-05 LAB — HEMOGLOBIN A1C: Hgb A1c MFr Bld: 6.2 % (ref 4.6–6.5)

## 2017-11-05 MED ORDER — OMEPRAZOLE 40 MG PO CPDR: 40.0000 mg | DELAYED_RELEASE_CAPSULE | Freq: Every day | ORAL | 3 refills | Status: DC | PRN

## 2017-11-05 NOTE — Progress Notes (Signed)
Subjective:    Patient ID: Rachel Norris, female    DOB: [birth year], 56 y.o.   MRN: [id number]  DOS:  11/05/2017 Type of visit - description : rov Interval history: HTN: Good compliance of medication, ambulatory BPs within normal DM: On metformin, CBGs lately have improved, they used to be in the 140s now in the 120s. Obesity: Unchanged, has not been able to see a nutritionist. GERD: Needs a refill on omeprazole  Wt Readings from Last 3 Encounters:  11/05/17 (!) 300 lb 6 oz (136.2 kg)  07/16/17 (!) 302 lb 8 oz (137.2 kg)  04/03/17 (!) 310 lb (140.6 kg)     Review of Systems Denies chest pain or difficulty breathing.  Occasional palpitations No nausea, vomiting, diarrhea Sleeps well most nights, occasional insomnia.   Past Medical History:  Diagnosis Date  . Allergic rhinitis   . Ankle swelling   . Anxiety   . Anxiety and depression   . Arthritis    generalized arthritis -knees, feet.back.   . 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 At risk for sleep apnea    STOP-BANG= 5            SENT TO PCP 04-05-2015  . Bronchitis    Bronchitis a few months ago- no issues now. - no Inhalers used daily.  Tennis Must Quervain's tenosynovitis, right   . Depression   . Diabetes mellitus without complication ([place])    "pre diabetes"  . Fibroid   . GERD (gastroesophageal reflux disease)   . Heart palpitations   . Hormone disorder   . Hypertension   . Irregular menstrual bleeding   . Labial cyst    bilateral icclusion  . Perimenopausal   . PONV (postoperative nausea and vomiting)   . Pre-diabetes   . Prediabetes   . Shortness of breath dyspnea    with exertion, climbing a flight of stairs  . Thickened endometrium   . Wears glasses     Past Surgical History:  Procedure Laterality Date  . DILATION AND CURETTAGE OF UTERUS    . HYSTEROSCOPY    . LASER ABLATION OF THE CERVIX  1992   DYSPLAGIA  . 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 Marital status: Married    Spouse name: Not on file  . Number of children: 0  . Years of education: Not on file  . Highest education level: Not on file  Social Needs  . Financial resource strain: Not on file  . Food insecurity - worry: Not on file  . Food insecurity - inability: Not on file  . Transportation needs - medical: Not on file  . Transportation needs - non-medical: Not on file  Occupational History  . 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 Smoking status: Former Smoker    Packs/day: 1.00    Years: 20.00    Pack years: 20.00    Types: Cigarettes    Last attempt to quit: 04/04/1996    Years since quitting: 21.6  . Smokeless tobacco: Never Used  Substance and Sexual Activity  . Alcohol use: Yes    Alcohol/week: 0.0 oz    Comment: rare 6 per year  . Drug use: No  . Sexual activity: No    Partners:  Male  Other Topics Concern  . Not on file  Social History Narrative   Lives w/ husband          Allergies as of 11/05/2017      Reactions   Sulfa Antibiotics Hives      Medication List        Accurate as of 11/05/17 11:59 PM. Always use your most recent med list.          acetaminophen 500 MG tablet Commonly known as:  TYLENOL Take 1,000 mg by mouth every 6 (six) hours as needed (For pain.).   ALEVE 220 MG tablet Generic drug:  naproxen sodium Take 440 mg by mouth every 8 (eight) hours as needed (For pain.).   b complex vitamins tablet Take 1 tablet by mouth daily.   BENGAY EX Apply 1 application topically daily as needed (For pain.).   betamethasone valerate ointment 0.1 % Commonly known as:  VALISONE Apply a pea sized amount topically BID for 1-2 weeks as needed   fluticasone 50 MCG/ACT nasal spray Commonly known as:  FLONASE Place 2 sprays into both nostrils daily as needed for  allergies.   glucose blood test strip Commonly known as:  ONETOUCH VERIO Check blood sugar once daily   hydrochlorothiazide 25 MG tablet Commonly known as:  HYDRODIURIL Take 1 tablet (25 mg total) by mouth daily.   losartan 100 MG tablet Commonly known as:  COZAAR Take 1 tablet (100 mg total) by mouth daily.   metFORMIN 500 MG tablet Commonly known as:  GLUCOPHAGE Take 1 tablet (500 mg total) by mouth 2 (two) times daily with a meal.   multivitamin with minerals Tabs tablet Take 1 tablet by mouth daily.   omeprazole 40 MG capsule Commonly known as:  PRILOSEC Take 1 capsule (40 mg total) daily as needed by mouth (For heartburn or acid reflux.).   onetouch ultrasoft lancets Check blood sugar once daily   Turmeric 500 MG Caps Take 500 mg by mouth 2 (two) times daily.   VENTOLIN HFA 108 (90 Base) MCG/ACT inhaler Generic drug:  albuterol Inhale 1 puff into the lungs every 4 (four) hours as needed for wheezing or shortness of breath.          Objective:   Physical Exam BP 124/72 (BP Location: Left Wrist, Patient Position: Sitting, Cuff Size: Small)   Pulse 62   Temp 97.6 F (36.4 C) (Oral)   Resp 14   Ht 5\' 3"  (1.6 m)   Wt (!) 300 lb 6 oz (136.2 kg)   SpO2 96%   BMI 53.21 kg/m  General:   Well developed, morbidly obese appearing. NAD.  HEENT:  Normocephalic . Face symmetric, atraumatic Lungs:  CTA B Normal respiratory effort, no intercostal retractions, no accessory muscle use. Heart: RRR,  no murmur.  Skin: Not pale. Not jaundice Neurologic:  alert & oriented X3.  Speech normal, gait appropriate for age and unassisted Psych--  Cognition and judgment appear intact.  Cooperative with normal attention span and concentration.  Behavior appropriate. No anxious or depressed appearing.       Assessment & Plan:   Assessment  DM HTN Asthma Anxiety, depression Morbid obesity Allergic rhinitis Dyspepsia (prn omeprazole) L calf larger : [place] 02-2016  (-)  Plan  DM: Currently on metformin, check an A1c HTN: Seems well controlled on losartan, HCTZ.  Check a BMP Morbid obesity: We talked about diet and exercise,  rec to be seen at the  bariatric office. Anxiety depression:  Very busy at work but doing okay emotionally. Larger left calf, ultrasound showed no clot.  See last visit. RTC CPX 3 months

## 2017-11-05 NOTE — Patient Instructions (Signed)
GO TO THE LAB : Get the blood work     GO TO THE FRONT DESK Schedule your next appointment for a physical exam in 3-4 months, fasting

## 2017-11-05 NOTE — Progress Notes (Signed)
Pre visit review using our clinic review tool, if applicable. No additional management support is needed unless otherwise documented below in the visit note. 

## 2017-11-06 NOTE — Assessment & Plan Note (Signed)
DM: Currently on metformin, check an A1c HTN: Seems well controlled on losartan, HCTZ.  Check a BMP Morbid obesity: We talked about diet and exercise,  rec to be seen at the  bariatric office. Anxiety depression: Very busy at work but doing okay emotionally. Larger left calf, ultrasound showed no clot.  See last visit. RTC CPX 3 months

## 2017-12-26 ENCOUNTER — Other Ambulatory Visit: Payer: Self-pay | Admitting: Obstetrics and Gynecology

## 2017-12-26 DIAGNOSIS — Z1231 Encounter for screening mammogram for malignant neoplasm of breast: Secondary | ICD-10-CM

## 2017-12-27 ENCOUNTER — Ambulatory Visit (HOSPITAL_BASED_OUTPATIENT_CLINIC_OR_DEPARTMENT_OTHER)
Admission: RE | Admit: 2017-12-27 | Discharge: 2017-12-27 | Disposition: A | Discharge status: Home / Self Care | Payer: BLUE CROSS/BLUE SHIELD | Source: Ambulatory Visit | Attending: Obstetrics and Gynecology | Admitting: Obstetrics and Gynecology

## 2017-12-27 ENCOUNTER — Encounter (HOSPITAL_BASED_OUTPATIENT_CLINIC_OR_DEPARTMENT_OTHER): Payer: Self-pay | Admitting: Radiology

## 2017-12-27 DIAGNOSIS — Z1231 Encounter for screening mammogram for malignant neoplasm of breast: Secondary | ICD-10-CM | POA: Insufficient documentation

## 2018-01-20 ENCOUNTER — Other Ambulatory Visit: Payer: Self-pay

## 2018-01-20 LAB — HM DIABETES EYE EXAM

## 2018-01-20 MED ORDER — METFORMIN HCL 500 MG PO TABS: 500.0000 mg | ORAL_TABLET | Freq: Two times a day (BID) | ORAL | 0 refills | Status: DC

## 2018-01-24 ENCOUNTER — Encounter: Payer: Self-pay | Admitting: Internal Medicine

## 2018-01-26 ENCOUNTER — Other Ambulatory Visit: Payer: Self-pay | Admitting: Internal Medicine

## 2018-02-01 ENCOUNTER — Other Ambulatory Visit: Payer: Self-pay | Admitting: Internal Medicine

## 2018-02-03 ENCOUNTER — Other Ambulatory Visit: Payer: Self-pay

## 2018-02-03 MED ORDER — ACCU-CHEK AVIVA DEVI: 0 refills | Status: AC

## 2018-02-03 MED ORDER — ACCU-CHEK SOFT TOUCH LANCETS MISC: 12 refills | Status: DC

## 2018-02-03 MED ORDER — GLUCOSE BLOOD VI STRP: ORAL_STRIP | 12 refills | Status: DC

## 2018-04-10 ENCOUNTER — Other Ambulatory Visit (HOSPITAL_COMMUNITY)
Admission: RE | Admit: 2018-04-10 | Discharge: 2018-04-10 | Disposition: A | Discharge status: Home / Self Care | Payer: BLUE CROSS/BLUE SHIELD | Source: Ambulatory Visit | Attending: Obstetrics and Gynecology | Admitting: Obstetrics and Gynecology

## 2018-04-10 ENCOUNTER — Ambulatory Visit: Payer: BLUE CROSS/BLUE SHIELD | Admitting: Obstetrics and Gynecology

## 2018-04-10 ENCOUNTER — Encounter: Payer: Self-pay | Admitting: Obstetrics and Gynecology

## 2018-04-10 ENCOUNTER — Other Ambulatory Visit: Payer: Self-pay

## 2018-04-10 VITALS — BP 130/80 | HR 64 | Resp 16 | Ht 62.5 in | Wt 299.0 lb

## 2018-04-10 DIAGNOSIS — N939 Abnormal uterine and vaginal bleeding, unspecified: Secondary | ICD-10-CM | POA: Insufficient documentation

## 2018-04-10 DIAGNOSIS — Z6841 Body Mass Index (BMI) 40.0 and over, adult: Secondary | ICD-10-CM | POA: Diagnosis not present

## 2018-04-10 DIAGNOSIS — Z124 Encounter for screening for malignant neoplasm of cervix: Secondary | ICD-10-CM

## 2018-04-10 DIAGNOSIS — Z01419 Encounter for gynecological examination (general) (routine) without abnormal findings: Secondary | ICD-10-CM | POA: Diagnosis not present

## 2018-04-10 DIAGNOSIS — L309 Dermatitis, unspecified: Secondary | ICD-10-CM | POA: Diagnosis not present

## 2018-04-10 MED ORDER — BETAMETHASONE VALERATE 0.1 % EX OINT: TOPICAL_OINTMENT | CUTANEOUS | 0 refills | Status: DC

## 2018-04-10 NOTE — Progress Notes (Signed)
57 y.o. G0P0000 MarriedCaucasianF here for annual exam.  She had some spotting in March, prior episode of bleeding was ~4/18 She had a hysteroscopic myomectomy in 8/17, prior to that she had an [place] of 49.4. Hot flashes and night sweats are better. She has some "cold flashes". She has changed her diet, cut back on carbs, has lost 11 lbs since her last visit here.  She has trouble with exercise, joints hurt. She does have a pool in her neighborhood.  Not sexually active, she and her husband are fine with it. Get along really well.  She may retire next year. Husband is already retired.  She c/o irritation of her skin in the peri-anal region.     Patient's last menstrual period was 09/15/2016.          Sexually active: No.  The current method of family planning is post menopausal status.    Exercising: No.  The patient does not participate in regular exercise at present. Smoker:  Former smoker   Health Maintenance: Pap:  03-26-16 WNL NEG HR HPV  History of abnormal Pap:  Yes in the 1990s- had BX MMG:  12-27-17 WNL  Colonoscopy:  05-15-16 WNL  BMD:   Never TDaP:  2012 Gardasil: N/A   reports that she quit smoking about 22 years ago. Her smoking use included cigarettes. She has a 20.00 pack-year smoking history. She has never used smokeless tobacco. She reports that she drinks alcohol. She reports that she does not use drugs. Occasional ETOH. She is a scrub tech at [employer]   Past Medical History:  Diagnosis Date  . Allergic rhinitis   . Ankle swelling   . Anxiety   . Anxiety and depression   . Arthritis    generalized arthritis -knees, feet.back.   . 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 At risk for sleep apnea    STOP-BANG= 5            SENT TO PCP 04-05-2015  . Bronchitis    Bronchitis a few months ago- no issues now. - no Inhalers used daily.  Tennis Must Quervain's tenosynovitis, right   . Depression   . Diabetes mellitus without complication ([place])    "pre diabetes"  . Fibroid    . GERD (gastroesophageal reflux disease)   . Heart palpitations   . Hormone disorder   . Hypertension   . Irregular menstrual bleeding   . Labial cyst    bilateral icclusion  . Perimenopausal   . PONV (postoperative nausea and vomiting)   . Pre-diabetes   . Prediabetes   . Shortness of breath dyspnea    with exertion, climbing a flight of stairs  . Thickened endometrium   . Wears glasses     Past Surgical History:  Procedure Laterality Date  . COLONOSCOPY WITH PROPOFOL N/A 05/15/2016   Procedure: COLONOSCOPY WITH PROPOFOL;  Surgeon: Mauri Pole, MD;  Location: [facility];  Service: Endoscopy;  Laterality: N/A;  . DILATATION & CURETTAGE/HYSTEROSCOPY WITH MYOSURE N/A 08/30/2016   Procedure: DILATATION & CURETTAGE/HYSTEROSCOPY WITH MYOSURE;  Surgeon: Salvadore Dom, MD;  Location: [facility];  Service: Gynecology;  Laterality: N/A;  hysteroscopic myomectomy. BMI 53.8  . DILATION AND CURETTAGE OF UTERUS    . EAR CYST EXCISION Bilateral 04/08/2015   Procedure: removal of bilateral labial inclusion cysts;  Surgeon: Linda Hedges, DO;  Location: [facility];  Service: Gynecology;  Laterality: Bilateral;  . HYSTEROSCOPY    . HYSTEROSCOPY  W/D&C N/A 04/08/2015   Procedure: DILATATION AND CURETTAGE /HYSTEROSCOPY, removal of bilateral labial inclusion cysts;  Surgeon: Linda Hedges, DO;  Location: [facility];  Service: Gynecology;  Laterality: N/A;  . LASER ABLATION OF THE CERVIX  1992   DYSPLAGIA  . MYOMECTOMY N/A 08/30/2016   Procedure: HYSTEROSCOPIC MYOMECTOMY;  Surgeon: Salvadore Dom, MD;  Location: [facility];  Service: Gynecology;  Laterality: N/A;  . 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 acetaminophen (TYLENOL) 500 MG tablet Take 1,000 mg by mouth every 6 (six) hours  as needed (For pain.).     Marland Kitchen albuterol (VENTOLIN HFA) 108 (90 Base) MCG/ACT inhaler Inhale 1 puff into the lungs every 4 (four) hours as needed for wheezing or shortness of breath.     . betamethasone valerate ointment (VALISONE) 0.1 % Apply a pea sized amount topically BID for 1-2 weeks as needed 15 g 0  . Blood Glucose Monitoring Suppl (ACCU-CHEK [name]) device CHECK BLOOD SUGAR ONCE DAILY 1 each 0  . fluticasone (FLONASE) 50 MCG/ACT nasal spray Place 2 sprays into both nostrils daily as needed for allergies.    Marland Kitchen glucose blood (ACCU-CHEK [name]) test strip CHECK BLOOD SUGAR ONCE DAILY 100 each 12  . hydrochlorothiazide (HYDRODIURIL) 25 MG tablet Take 1 tablet (25 mg total) by mouth daily. 90 tablet 1  . Lancets (ACCU-CHEK SOFT TOUCH) lancets CHECK BLOOD SUGAR ONCE DAILY 100 each 12  . losartan (COZAAR) 100 MG tablet Take 1 tablet (100 mg total) by mouth daily. 90 tablet 0  . Menthol, Topical Analgesic, (BENGAY EX) Apply 1 application topically daily as needed (For pain.).    Marland Kitchen metFORMIN (GLUCOPHAGE) 500 MG tablet Take 1 tablet (500 mg total) by mouth 2 (two) times daily with a meal. 180 tablet 0  . naproxen sodium (ALEVE) 220 MG tablet Take 440 mg by mouth every 8 (eight) hours as needed (For pain.).    Marland Kitchen omeprazole (PRILOSEC) 40 MG capsule Take 1 capsule (40 mg total) daily as needed by mouth (For heartburn or acid reflux.). 90 capsule 3   No current facility-administered medications for this visit.     Family History  Problem Relation Age of Onset  . Diabetes Mother        M anmd others  . Dementia Mother   . Stroke Mother   . Stroke Father   . AAA (abdominal aortic aneurysm) Father   . Atrial fibrillation Sister   . Colon cancer Neg Hx   . Breast cancer Neg Hx   . Coronary artery disease Neg Hx     Review of Systems  Constitutional: Negative.   HENT: Negative.   Eyes: Negative.   Respiratory: Negative.   Cardiovascular: Negative.   Gastrointestinal: Negative.   Endocrine:  Negative.   Genitourinary: Negative.        Anal cut  Musculoskeletal: Positive for myalgias.  Skin: Negative.   Allergic/Immunologic: Negative.   Neurological: Negative.   Psychiatric/Behavioral: Negative.     Exam:   BP 130/80 (BP Location: Right Arm, Patient Position: Sitting, Cuff Size: Normal)   Pulse 64   Resp 16   Ht 5' 2.5" (1.588 m)   Wt 299 lb (135.6 kg)   LMP 09/15/2016   BMI 53.82 kg/m   Weight change: @WEIGHTCHANGE @ Height:   Height: 5' 2.5" (158.8 cm)  Ht Readings from Last 3 Encounters:  04/10/18 5' 2.5" (1.588 m)  11/05/17 5\' 3"  (1.6 m)  07/16/17 5\' 3"  (1.6 m)    General appearance: alert, cooperative and appears stated age Head: Normocephalic, without obvious abnormality, atraumatic Neck: no adenopathy, supple, symmetrical, trachea midline and thyroid normal to inspection and palpation Lungs: clear to auscultation bilaterally Cardiovascular: regular rate and rhythm Breasts: normal appearance, no masses or tenderness Abdomen: soft, non-tender; non distended,  no masses,  no organomegaly Extremities: extremities normal, atraumatic, no cyanosis or edema Skin: Skin color, texture, turgor normal. No rashes or lesions Lymph nodes: Cervical, supraclavicular, and axillary nodes normal. No abnormal inguinal nodes palpated Neurologic: Grossly normal   Pelvic: External genitalia:  no lesions              Urethra:  normal appearing urethra with no masses, tenderness or lesions              Bartholins and Skenes: normal                 Vagina: normal appearing vagina with normal color and discharge, no lesions              Cervix: no lesions               Bimanual Exam:  Uterus:  exam limited by BMI, no masses or tenderness              Adnexa: no mass, fullness, tenderness               Rectovaginal: Confirms               Anus:  normal sphincter tone, no lesions Perianal skin: posterior to the anus the skin is red, + fissure  Chaperone was present for  exam.  A:  Well Woman with normal exam  AUB, suspect she is menopausal  BMI 53, given information on weight loss clinic. She is considering Gastric Sleeve  Perianal dermatitis   P:   Pap with reflex  Set up U/S, possible sonohysterogram, possible biopsy  Mammogram and colonoscopy UTD  Working on weight loss  Discussed breast self exam  Discussed calcium and vit D intake  Steroid ointment for dermatitis, discussed vulvar/perianal skin care, information given. Use Vaseline as needed

## 2018-04-10 NOTE — Patient Instructions (Signed)
EXERCISE AND DIET:  We recommended that you start or continue a regular exercise program for good health. Regular exercise means any activity that makes your heart beat faster and makes you sweat.  We recommend exercising at least 30 minutes per day at least 3 days a week, preferably 4 or 5.  We also recommend a diet low in fat and sugar.  Inactivity, poor dietary choices and obesity can cause diabetes, heart attack, stroke, and kidney damage, among others.    ALCOHOL AND SMOKING:  Women should limit their alcohol intake to no more than 7 drinks/beers/glasses of wine (combined, not each!) per week. Moderation of alcohol intake to this level decreases your risk of breast cancer and liver damage. And of course, no recreational drugs are part of a healthy lifestyle.  And absolutely no smoking or even second hand smoke. Most people know smoking can cause heart and lung diseases, but did you know it also contributes to weakening of your bones? Aging of your skin?  Yellowing of your teeth and nails?  CALCIUM AND VITAMIN D:  Adequate intake of calcium and Vitamin D are recommended.  The recommendations for exact amounts of these supplements seem to change often, but generally speaking 600 mg of calcium (either carbonate or citrate) and 800 units of Vitamin D per day seems prudent. Certain women may benefit from higher intake of Vitamin D.  If you are among these women, your doctor will have told you during your visit.    PAP SMEARS:  Pap smears, to check for cervical cancer or precancers,  have traditionally been done yearly, although recent scientific advances have shown that most women can have pap smears less often.  However, every woman still should have a physical exam from her gynecologist every year. It will include a breast check, inspection of the vulva and vagina to check for abnormal growths or skin changes, a visual exam of the cervix, and then an exam to evaluate the size and shape of the uterus and  ovaries.  And after 56 years of age, a rectal exam is indicated to check for rectal cancers. We will also provide age appropriate advice regarding health maintenance, like when you should have certain vaccines, screening for sexually transmitted diseases, bone density testing, colonoscopy, mammograms, etc.   MAMMOGRAMS:  All women over 40 years old should have a yearly mammogram. Many facilities now offer a "3D" mammogram, which may cost around $50 extra out of pocket. If possible,  we recommend you accept the option to have the 3D mammogram performed.  It both reduces the number of women who will be called back for extra views which then turn out to be normal, and it is better than the routine mammogram at detecting truly abnormal areas.    COLONOSCOPY:  Colonoscopy to screen for colon cancer is recommended for all women at age 50.  We know, you hate the idea of the prep.  We agree, BUT, having colon cancer and not knowing it is worse!!  Colon cancer so often starts as a polyp that can be seen and removed at colonscopy, which can quite literally save your life!  And if your first colonoscopy is normal and you have no family history of colon cancer, most women don't have to have it again for 10 years.  Once every ten years, you can do something that may end up saving your life, right?  We will be happy to help you get it scheduled when you are ready.    Be sure to check your insurance coverage so you understand how much it will cost.  It may be covered as a preventative service at no cost, but you should check your particular policy.      Breast Self-Awareness Breast self-awareness means being familiar with how your breasts look and feel. It involves checking your breasts regularly and reporting any changes to your health care provider. Practicing breast self-awareness is important. A change in your breasts can be a sign of a serious medical problem. Being familiar with how your breasts look and feel allows  you to find any problems early, when treatment is more likely to be successful. All women should practice breast self-awareness, including women who have had breast implants. How to do a breast self-exam One way to learn what is normal for your breasts and whether your breasts are changing is to do a breast self-exam. To do a breast self-exam: Look for Changes  1. Remove all the clothing above your waist. 2. Stand in front of a mirror in a room with good lighting. 3. Put your hands on your hips. 4. Push your hands firmly downward. 5. Compare your breasts in the mirror. Look for differences between them (asymmetry), such as: ? Differences in shape. ? Differences in size. ? Puckers, dips, and bumps in one breast and not the other. 6. Look at each breast for changes in your skin, such as: ? Redness. ? Scaly areas. 7. Look for changes in your nipples, such as: ? Discharge. ? Bleeding. ? Dimpling. ? Redness. ? A change in position. Feel for Changes  Carefully feel your breasts for lumps and changes. It is best to do this while lying on your back on the floor and again while sitting or standing in the shower or tub with soapy water on your skin. Feel each breast in the following way:  Place the arm on the side of the breast you are examining above your head.  Feel your breast with the other hand.  Start in the nipple area and make  inch (2 cm) overlapping circles to feel your breast. Use the pads of your three middle fingers to do this. Apply light pressure, then medium pressure, then firm pressure. The light pressure will allow you to feel the tissue closest to the skin. The medium pressure will allow you to feel the tissue that is a little deeper. The firm pressure will allow you to feel the tissue close to the ribs.  Continue the overlapping circles, moving downward over the breast until you feel your ribs below your breast.  Move one finger-width toward the center of the body.  Continue to use the  inch (2 cm) overlapping circles to feel your breast as you move slowly up toward your collarbone.  Continue the up and down exam using all three pressures until you reach your armpit.  Write Down What You Find  Write down what is normal for each breast and any changes that you find. Keep a written record with breast changes or normal findings for each breast. By writing this information down, you do not need to depend only on memory for size, tenderness, or location. Write down where you are in your menstrual cycle, if you are still menstruating. If you are having trouble noticing differences in your breasts, do not get discouraged. With time you will become more familiar with the variations in your breasts and more comfortable with the exam. How often should I examine my breasts? Examine   your breasts every month. If you are breastfeeding, the best time to examine your breasts is after a feeding or after using a breast pump. If you menstruate, the best time to examine your breasts is 5-7 days after your period is over. During your period, your breasts are lumpier, and it may be more difficult to notice changes. When should I see my health care provider? See your health care provider if you notice:  A change in shape or size of your breasts or nipples.  A change in the skin of your breast or nipples, such as a reddened or scaly area.  Unusual discharge from your nipples.  A lump or thick area that was not there before.  Pain in your breasts.  Anything that concerns you.  This information is not intended to replace advice given to you by your health care provider. Make sure you discuss any questions you have with your health care provider. Document Released: 12/17/2005 Document Revised: 05/24/2016 Document Reviewed: 11/06/2015 Elsevier Interactive Patient Education  2018 [employer] Inc[employer]  

## 2018-04-11 LAB — CYTOLOGY - PAP: DIAGNOSIS: NEGATIVE

## 2018-04-18 ENCOUNTER — Other Ambulatory Visit: Payer: Self-pay | Admitting: Internal Medicine

## 2018-04-28 ENCOUNTER — Other Ambulatory Visit: Payer: Self-pay | Admitting: Internal Medicine

## 2018-04-29 ENCOUNTER — Other Ambulatory Visit: Payer: Self-pay | Admitting: Obstetrics and Gynecology

## 2018-04-29 ENCOUNTER — Encounter: Payer: Self-pay | Admitting: Obstetrics and Gynecology

## 2018-04-29 ENCOUNTER — Ambulatory Visit (INDEPENDENT_AMBULATORY_CARE_PROVIDER_SITE_OTHER): Payer: BLUE CROSS/BLUE SHIELD | Admitting: Obstetrics and Gynecology

## 2018-04-29 ENCOUNTER — Ambulatory Visit (INDEPENDENT_AMBULATORY_CARE_PROVIDER_SITE_OTHER): Payer: BLUE CROSS/BLUE SHIELD

## 2018-04-29 ENCOUNTER — Other Ambulatory Visit: Payer: Self-pay

## 2018-04-29 VITALS — BP 134/82 | HR 72 | Resp 14 | Ht 62.5 in | Wt 297.4 lb

## 2018-04-29 DIAGNOSIS — D252 Subserosal leiomyoma of uterus: Secondary | ICD-10-CM | POA: Diagnosis not present

## 2018-04-29 DIAGNOSIS — N939 Abnormal uterine and vaginal bleeding, unspecified: Secondary | ICD-10-CM

## 2018-04-29 DIAGNOSIS — D251 Intramural leiomyoma of uterus: Secondary | ICD-10-CM

## 2018-04-29 NOTE — Patient Instructions (Addendum)
Endometrial Biopsy Post-procedure Instructions . Cramping is common.  You may take Ibuprofen, Aleve, or Tylenol for the cramping.  This should resolve within 24 hours.   . You may have a small amount of spotting.  You should wear a mini pad for the next few days. . You may have intercourse in 24 hours. . You need to call the office if you have any pelvic pain, fever, heavy bleeding, or foul smelling vaginal discharge. . Shower or bathe as normal . You will be notified within one week of your biopsy results or we will discuss your results at your follow-up appointment if needed.   I would recommend a mediterranean diet.  A mediterranean diet is high in fruits, vegetables, whole grains, fish, chicken, nuts, healthy fats (olive oil or canola oil). Low fat dairy. Limit butter, margarine, red meat and sweets.

## 2018-04-29 NOTE — Progress Notes (Signed)
Blood pressure recheck in right arm and was 134/82.

## 2018-04-29 NOTE — Progress Notes (Signed)
GYNECOLOGY  VISIT   HPI: 57 y.o.   Married  Caucasian  female   [id number] with Patient's last menstrual period was 09/15/2016.   here for evaluation of AUB, likely PMP bleeding. She had 11 months of amenorrhea, then had spotting in March. Prior elevated FSH last year.   GYNECOLOGIC HISTORY: Patient's last menstrual period was 09/15/2016. Contraception: post menopausal  Menopausal hormone therapy: none        OB History    Gravida  0   Para  0   Term  0   Preterm  0   AB  0   Living  0     SAB  0   TAB  0   Ectopic  0   Multiple  0   Live Births                 Patient Active Problem List   Diagnosis Date Noted  . Encounter for screening colonoscopy   . PCP NOTES >>> 10/07/2015  . Fatigue 07/12/2015  . Diabetes ([place]) 07/27/2013  . Annual physical exam 04/24/2012  . Pain in both feet 03/14/2012  . Nail lesion 03/14/2012  . Morbid obesity ([place]) 08/29/2009  . Asthma 06/22/2008  . Anxiety and depression 03/07/2007  . Essential hypertension 03/07/2007  . ALLERGIC RHINITIS 03/07/2007    Past Medical History:  Diagnosis Date  . Allergic rhinitis   . Ankle swelling   . Anxiety   . Anxiety and depression   . Arthritis    generalized arthritis -knees, feet.back.   . 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 At risk for sleep apnea    STOP-BANG= 5            SENT TO PCP 04-05-2015  . Bronchitis    Bronchitis a few months ago- no issues now. - no Inhalers used daily.  Tennis Must Quervain's tenosynovitis, right   . Depression   . Diabetes mellitus without complication ([place])    "pre diabetes"  . Fibroid   . GERD (gastroesophageal reflux disease)   . Heart palpitations   . Hormone disorder   . Hypertension   . Irregular menstrual bleeding   . Labial cyst    bilateral icclusion  . Perimenopausal   . PONV (postoperative nausea and vomiting)   . Pre-diabetes   . Prediabetes   . Shortness of breath dyspnea    with exertion, climbing a flight of stairs   . Thickened endometrium   . Wears glasses     Past Surgical History:  Procedure Laterality Date  . COLONOSCOPY WITH PROPOFOL N/A 05/15/2016   Procedure: COLONOSCOPY WITH PROPOFOL;  Surgeon: Mauri Pole, MD;  Location: [facility];  Service: Endoscopy;  Laterality: N/A;  . DILATATION & CURETTAGE/HYSTEROSCOPY WITH MYOSURE N/A 08/30/2016   Procedure: DILATATION & CURETTAGE/HYSTEROSCOPY WITH MYOSURE;  Surgeon: Salvadore Dom, MD;  Location: [facility];  Service: Gynecology;  Laterality: N/A;  hysteroscopic myomectomy. BMI 53.8  . DILATION AND CURETTAGE OF UTERUS    . EAR CYST EXCISION Bilateral 04/08/2015   Procedure: removal of bilateral labial inclusion cysts;  Surgeon: Linda Hedges, DO;  Location: [facility];  Service: Gynecology;  Laterality: Bilateral;  . HYSTEROSCOPY    . HYSTEROSCOPY W/D&C N/A 04/08/2015   Procedure: DILATATION AND CURETTAGE /HYSTEROSCOPY, removal of bilateral labial inclusion cysts;  Surgeon: Linda Hedges, DO;  Location: [facility];  Service: Gynecology;  Laterality: N/A;  . LASER ABLATION OF THE CERVIX  1992   DYSPLAGIA  . MYOMECTOMY N/A 08/30/2016   Procedure: HYSTEROSCOPIC MYOMECTOMY;  Surgeon: Salvadore Dom, MD;  Location: [facility];  Service: Gynecology;  Laterality: N/A;  . 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 acetaminophen (TYLENOL) 500 MG tablet Take 1,000 mg by mouth every 6 (six) hours as needed (For pain.).     Marland Kitchen albuterol (VENTOLIN HFA) 108 (90 Base) MCG/ACT inhaler Inhale 1 puff into the lungs every 4 (four) hours as needed for wheezing or shortness of breath.     . betamethasone valerate ointment (VALISONE) 0.1 % Apply a pea sized amount topically BID for 1-2 weeks as needed 15 g 0  . Blood Glucose Monitoring Suppl (ACCU-CHEK [name])  device CHECK BLOOD SUGAR ONCE DAILY 1 each 0  . fluticasone (FLONASE) 50 MCG/ACT nasal spray Place 2 sprays into both nostrils daily as needed for allergies.    Marland Kitchen glucose blood (ACCU-CHEK [name]) test strip CHECK BLOOD SUGAR ONCE DAILY 100 each 12  . hydrochlorothiazide (HYDRODIURIL) 25 MG tablet Take 1 tablet (25 mg total) by mouth daily. 90 tablet 1  . Lancets (ACCU-CHEK SOFT TOUCH) lancets CHECK BLOOD SUGAR ONCE DAILY 100 each 12  . losartan (COZAAR) 100 MG tablet Take 1 tablet (100 mg total) by mouth daily. 90 tablet 0  . Menthol, Topical Analgesic, (BENGAY EX) Apply 1 application topically daily as needed (For pain.).    Marland Kitchen metFORMIN (GLUCOPHAGE) 500 MG tablet Take 1 tablet (500 mg total) by mouth 2 (two) times daily with a meal. 60 tablet 0  . naproxen sodium (ALEVE) 220 MG tablet Take 440 mg by mouth every 8 (eight) hours as needed (For pain.).    Marland Kitchen omeprazole (PRILOSEC) 40 MG capsule Take 1 capsule (40 mg total) daily as needed by mouth (For heartburn or acid reflux.). 90 capsule 3   No current facility-administered medications for this visit.      ALLERGIES: Sulfa antibiotics  Family History  Problem Relation Age of Onset  . Diabetes Mother        M anmd others  . Dementia Mother   . Stroke Mother   . Stroke Father   . AAA (abdominal aortic aneurysm) Father   . Atrial fibrillation Sister   . Colon cancer Neg Hx   . Breast cancer Neg Hx   . Coronary artery disease Neg Hx     Social History   Socioeconomic History  . Marital status: Married    Spouse name: Not on file  . Number of children: 0  . Years of education: Not on file  . Highest education level: Not on file  Occupational History  . 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 Financial resource strain: Not on file  . Food insecurity:    Worry: Not on file    Inability: Not on file  . Transportation needs:    Medical: Not on file    Non-medical: Not on file  Tobacco  Use  . Smoking status: Former Smoker    Packs/day: 1.00    Years: 20.00    Pack years: 20.00    Types: Cigarettes    Last attempt to quit: 04/04/1996    Years since quitting: 22.0  . Smokeless tobacco: Never Used  Substance and Sexual Activity  .  Alcohol use: Yes    Alcohol/week: 0.0 oz    Comment: rare 6 per year  . Drug use: No  . Sexual activity: Not Currently    Partners: Male  Lifestyle  . Physical activity:    Days per week: Not on file    Minutes per session: Not on file  . Stress: Not on file  Relationships  . Social connections:    Talks on phone: Not on file    Gets together: Not on file    Attends religious service: Not on file    Active member of club or organization: Not on file    Attends meetings of clubs or organizations: Not on file    Relationship status: Not on file  . Intimate partner violence:    Fear of current or ex partner: Not on file    Emotionally abused: Not on file    Physically abused: Not on file    Forced sexual activity: Not on file  Other Topics Concern  . Not on file  Social History Narrative   Lives w/ husband        Review of Systems  Constitutional: Negative.   HENT: Negative.   Eyes: Negative.   Respiratory: Negative.   Cardiovascular: Negative.   Gastrointestinal: Negative.   Genitourinary: Negative.   Musculoskeletal: Negative.   Skin: Negative.   Neurological: Negative.   Endo/Heme/Allergies: Negative.   Psychiatric/Behavioral: Negative.     PHYSICAL EXAMINATION:    BP (!) 140/92 (BP Location: Right Arm, Patient Position: Sitting, Cuff Size: Large)   Pulse 72   Resp 14   Ht 5' 2.5" (1.588 m)   Wt 297 lb 6.4 oz (134.9 kg)   LMP 09/15/2016   BMI 53.53 kg/m     General appearance: alert, cooperative and appears stated age  Pelvic: External genitalia:  no lesions              Urethra:  normal appearing urethra with no masses, tenderness or lesions              Bartholins and Skenes: normal                  Vagina: normal appearing vagina with normal color and discharge, no lesions              Cervix: no lesions               Sonohysterogram The procedure and risks of the procedure were reviewed with the patient, consent form was signed. A speculum was placed in the vagina and the cervix was cleansed with betadine. The sonohysterogram catheter was inserted into the uterine cavity without difficulty. Saline was infused under direct observation with the ultrasound. No intracavitary defects were noted. The endometrium was slightly thickened. The catheter was removed.   The risks of endometrial biopsy were reviewed and a consent was obtained.  A speculum was placed in the vagina and the cervix was cleansed with betadine. A mini-pipelle was placed into the endometrial cavity. The uterus sounded to 7 cm. The endometrial biopsy was performed, moderate tissue was obtained. The tenaculum and speculum were removed. There were no complications.     Chaperone was present for exam.  Ultrasound images reviewed with the patient  ASSESSMENT AUB, suspect PMP U/S with fibroids and thickened lining Sonohysterogram without polyps  Elevated BP, f/u WNL  PLAN Endometrial biopsy [facility] Further plans based on above   An After Visit Summary was printed and given to  the patient.  ~15 minutes face to face time of which over 50% was spent in counseling.

## 2018-04-30 LAB — FOLLICLE STIMULATING HORMONE: FSH: 52.3 m[IU]/mL

## 2018-05-01 ENCOUNTER — Other Ambulatory Visit: Payer: Self-pay | Admitting: Internal Medicine

## 2018-05-01 NOTE — Addendum Note (Signed)
Addended by: Dorothy Spark on: 05/01/2018 11:10 AM   Modules accepted: Orders

## 2018-05-06 ENCOUNTER — Telehealth: Payer: Self-pay | Admitting: *Deleted

## 2018-05-06 MED ORDER — MEDROXYPROGESTERONE ACETATE 5 MG PO TABS: ORAL_TABLET | ORAL | 1 refills | Status: DC

## 2018-05-06 NOTE — Telephone Encounter (Signed)
Spoke with patient, advised as seen below per Dr. Talbert Nan. Rx for Provera 5mg  #15/1RF to verified pharmacy. Reviewed instructions for Provera, advised 15 tabs dispensed, patient is aware to take 1 tab daily for 5 days q other month. Patient verbalizes understanding and is agreeable. Will close encounter.

## 2018-05-06 NOTE — Telephone Encounter (Signed)
Patient returned call

## 2018-05-06 NOTE — Telephone Encounter (Signed)
Notes recorded by Burnice Logan, RN on 05/06/2018 at 11:07 AM EDT Left message to call Sharee Pimple at [phone].

## 2018-05-06 NOTE — Telephone Encounter (Signed)
-----   Message from Rachel Dom, MD sent at 05/05/2018  5:33 PM EDT ----- Please inform the patient that her biopsy was benign, but showed hormonal stimulation. I would recommend that she take cyclic provera, 5 mg x 5 days every other month until she goes 6 moths without bleeding. Please call and review.

## 2018-05-25 ENCOUNTER — Other Ambulatory Visit: Payer: Self-pay | Admitting: Internal Medicine

## 2018-06-19 ENCOUNTER — Other Ambulatory Visit: Payer: Self-pay | Admitting: Internal Medicine

## 2018-06-25 ENCOUNTER — Other Ambulatory Visit: Payer: Self-pay | Admitting: Internal Medicine

## 2018-07-08 ENCOUNTER — Ambulatory Visit: Payer: BLUE CROSS/BLUE SHIELD | Admitting: Internal Medicine

## 2018-07-08 ENCOUNTER — Encounter: Payer: Self-pay | Admitting: Internal Medicine

## 2018-07-08 VITALS — BP 138/78 | HR 76 | Temp 97.7°F | Resp 16 | Ht 63.0 in | Wt 309.1 lb

## 2018-07-08 DIAGNOSIS — E119 Type 2 diabetes mellitus without complications: Secondary | ICD-10-CM

## 2018-07-08 DIAGNOSIS — I10 Essential (primary) hypertension: Secondary | ICD-10-CM

## 2018-07-08 DIAGNOSIS — E785 Hyperlipidemia, unspecified: Secondary | ICD-10-CM | POA: Diagnosis not present

## 2018-07-08 LAB — COMPREHENSIVE METABOLIC PANEL
ALT: 19 U/L (ref 0–35)
AST: 13 U/L (ref 0–37)
Albumin: 4.2 g/dL (ref 3.5–5.2)
Alkaline Phosphatase: 59 U/L (ref 39–117)
BUN: 16 mg/dL (ref 6–23)
CHLORIDE: 99 meq/L (ref 96–112)
CO2: 32 meq/L (ref 19–32)
Calcium: 9.4 mg/dL (ref 8.4–10.5)
Creatinine, Ser: 0.47 mg/dL (ref 0.40–1.20)
GFR: 145.94 mL/min (ref >60.00)
GLUCOSE: 109 mg/dL — AB (ref 70–99)
Potassium: 4.2 mEq/L (ref 3.5–5.1)
Sodium: 138 mEq/L (ref 135–145)
Total Bilirubin: 0.3 mg/dL (ref 0.2–1.2)
Total Protein: 6.9 g/dL (ref 6.0–8.3)

## 2018-07-08 LAB — LIPID PANEL
CHOL/HDL RATIO: 3
Cholesterol: 193 mg/dL (ref 0–200)
HDL: 57.7 mg/dL (ref >39.00)
LDL CALC: 105 mg/dL — AB (ref 0–99)
NONHDL: 135.11
TRIGLYCERIDES: 151 mg/dL — AB (ref 0.0–149.0)
VLDL: 30.2 mg/dL (ref 0.0–40.0)

## 2018-07-08 LAB — HEMOGLOBIN A1C: Hgb A1c MFr Bld: 6.4 % (ref 4.6–6.5)

## 2018-07-08 MED ORDER — OMEPRAZOLE 40 MG PO CPDR: 40.0000 mg | DELAYED_RELEASE_CAPSULE | Freq: Every day | ORAL | 3 refills | Status: DC | PRN

## 2018-07-08 MED ORDER — METFORMIN HCL 500 MG PO TABS: 500.0000 mg | ORAL_TABLET | Freq: Two times a day (BID) | ORAL | 1 refills | Status: DC

## 2018-07-08 MED ORDER — HYDROCHLOROTHIAZIDE 25 MG PO TABS: 25.0000 mg | ORAL_TABLET | Freq: Every day | ORAL | 3 refills | Status: DC

## 2018-07-08 NOTE — Patient Instructions (Addendum)
GO TO THE LAB : Get the blood work     GO TO THE FRONT DESK Schedule your next appointment for a physical exam in 4-5 months   Consider see the bariatric office   HEALTHY SLEEP Sleep hygiene: Basic rules for a good night's sleep  Sleep only as much as you need to feel rested and then get out of bed  Keep a regular sleep schedule  Avoid forcing sleep  Exercise regularly for at least 20 minutes, preferably 4 to 5 hours before bedtime  Avoid caffeinated beverages after lunch  Avoid alcohol near bedtime: no "night cap"  Avoid smoking, especially in the evening  Do not go to bed hungry  Adjust bedroom environment  Avoid prolonged use of light-emitting screens before bedtime   Deal with your worries before bedtime    MELATONIN every night

## 2018-07-08 NOTE — Progress Notes (Signed)
Pre visit review using our clinic review tool, if applicable. No additional management support is needed unless otherwise documented below in the visit note. 

## 2018-07-08 NOTE — Assessment & Plan Note (Signed)
DM: Continue metformin, check a A1c HTN: Seems well controlled on losartan, check a CMP High cholesterol: LDL 127, goal 100.  Currently on diet only, check a FLP. Insomnia: Counseled about good sleep habits, recommend melatonin consistently, call if not better Morbid obesity: Counseled about diet, recommend to see one of the bariatric offices.  Information provided. RTC 4 to 5 months CPX

## 2018-07-08 NOTE — Progress Notes (Signed)
Subjective:    Patient ID: Rachel Norris, female    DOB: 10/12/1961, 57 y.o.   MRN: [id number]  DOS:  07/08/2018 Type of visit - description : Routine checkup Interval history: DM: Good compliance with meds, has gained a little bit of weight.  Room for improvement on lifestyle HTN: Good compliance with meds High cholesterol: due for labs  Wt Readings from Last 3 Encounters:  07/08/18 (!) 309 lb 2 oz (140.2 kg)  04/29/18 297 lb 6.4 oz (134.9 kg)  04/10/18 299 lb (135.6 kg)    Review of Systems Reports occasional allergies Occasional difficulty sleeping, this is going on for a while, frequently wakes up in the middle of night.  Took OTC sleep aid with no major help.  Admits to occasional stress at work.  Past Medical History:  Diagnosis Date  . Allergic rhinitis   . Ankle swelling   . Anxiety   . Anxiety and depression   . Arthritis    generalized arthritis -knees, feet.back.   . 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 At risk for sleep apnea    STOP-BANG= 5            SENT TO PCP 04-05-2015  . Bronchitis    Bronchitis a few months ago- no issues now. - no Inhalers used daily.  Tennis Must Quervain's tenosynovitis, right   . Depression   . Diabetes mellitus without complication ([place])    "pre diabetes"  . Fibroid   . GERD (gastroesophageal reflux disease)   . Heart palpitations   . Hormone disorder   . Hypertension   . Irregular menstrual bleeding   . Labial cyst    bilateral icclusion  . Perimenopausal   . PONV (postoperative nausea and vomiting)   . Pre-diabetes   . Prediabetes   . Shortness of breath dyspnea    with exertion, climbing a flight of stairs  . Thickened endometrium   . Wears glasses     Past Surgical History:  Procedure Laterality Date  . COLONOSCOPY WITH PROPOFOL N/A 05/15/2016   Procedure: COLONOSCOPY WITH PROPOFOL;  Surgeon: Mauri Pole, MD;  Location: [facility];  Service: Endoscopy;  Laterality: N/A;  . DILATATION &  CURETTAGE/HYSTEROSCOPY WITH MYOSURE N/A 08/30/2016   Procedure: DILATATION & CURETTAGE/HYSTEROSCOPY WITH MYOSURE;  Surgeon: Salvadore Dom, MD;  Location: [facility];  Service: Gynecology;  Laterality: N/A;  hysteroscopic myomectomy. BMI 53.8  . DILATION AND CURETTAGE OF UTERUS    . EAR CYST EXCISION Bilateral 04/08/2015   Procedure: removal of bilateral labial inclusion cysts;  Surgeon: Linda Hedges, DO;  Location: [facility];  Service: Gynecology;  Laterality: Bilateral;  . HYSTEROSCOPY    . HYSTEROSCOPY W/D&C N/A 04/08/2015   Procedure: DILATATION AND CURETTAGE /HYSTEROSCOPY, removal of bilateral labial inclusion cysts;  Surgeon: Linda Hedges, DO;  Location: [facility];  Service: Gynecology;  Laterality: N/A;  . LASER ABLATION OF THE CERVIX  1992   DYSPLAGIA  . MYOMECTOMY N/A 08/30/2016   Procedure: HYSTEROSCOPIC MYOMECTOMY;  Surgeon: Salvadore Dom, MD;  Location: [facility];  Service: Gynecology;  Laterality: N/A;  . 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 Marital status: Married    Spouse name: Not on file  . Number of children: 0  . Years of education: Not on file  .  Highest education level: Not on file  Occupational History  . 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 Financial resource strain: Not on file  . Food insecurity:    Worry: Not on file    Inability: Not on file  . Transportation needs:    Medical: Not on file    Non-medical: Not on file  Tobacco Use  . Smoking status: Former Smoker    Packs/day: 1.00    Years: 20.00    Pack years: 20.00    Types: Cigarettes    Last attempt to quit: 04/04/1996    Years since quitting: 22.2  . Smokeless tobacco: Never Used  Substance and Sexual Activity  . Alcohol use: Yes    Alcohol/week: 0.0 oz     Comment: rare 6 per year  . Drug use: No  . Sexual activity: Not Currently    Partners: Male  Lifestyle  . Physical activity:    Days per week: Not on file    Minutes per session: Not on file  . Stress: Not on file  Relationships  . Social connections:    Talks on phone: Not on file    Gets together: Not on file    Attends religious service: Not on file    Active member of club or organization: Not on file    Attends meetings of clubs or organizations: Not on file    Relationship status: Not on file  . Intimate partner violence:    Fear of current or ex partner: Not on file    Emotionally abused: Not on file    Physically abused: Not on file    Forced sexual activity: Not on file  Other Topics Concern  . Not on file  Social History Narrative   Lives w/ husband          Allergies as of 07/08/2018      Reactions   Sulfa Antibiotics Hives      Medication List        Accurate as of 07/08/18  5:33 PM. Always use your most recent med list.          ACCU-CHEK [name] device CHECK BLOOD SUGAR ONCE DAILY   accu-chek soft touch lancets CHECK BLOOD SUGAR ONCE DAILY   acetaminophen 500 MG tablet Commonly known as:  TYLENOL Take 1,000 mg by mouth every 6 (six) hours as needed (For pain.).   ALEVE 220 MG tablet Generic drug:  naproxen sodium Take 440 mg by mouth every 8 (eight) hours as needed (For pain.).   betamethasone valerate ointment 0.1 % Commonly known as:  VALISONE Apply a pea sized amount topically BID for 1-2 weeks as needed   fluticasone 50 MCG/ACT nasal spray Commonly known as:  FLONASE Place 2 sprays into both nostrils daily as needed for allergies.   glucose blood test strip Commonly known as:  ACCU-CHEK [name] CHECK BLOOD SUGAR ONCE DAILY   hydrochlorothiazide 25 MG tablet Commonly known as:  HYDRODIURIL Take 1 tablet (25 mg total) by mouth daily.   losartan 100 MG tablet Commonly known as:  COZAAR Take 1 tablet (100 mg total) by mouth daily.     medroxyPROGESTERone 5 MG tablet Commonly known as:  PROVERA Take 1 tablet (5mg  total) po daily x5 days every other month until 6 months without bleeding.   metFORMIN 500 MG tablet Commonly known as:  GLUCOPHAGE Take 1 tablet (500 mg total) by mouth 2 (  two) times daily with a meal.   omeprazole 40 MG capsule Commonly known as:  PRILOSEC Take 1 capsule (40 mg total) by mouth daily as needed (For heartburn or acid reflux.).   VENTOLIN HFA 108 (90 Base) MCG/ACT inhaler Generic drug:  albuterol Inhale 1 puff into the lungs every 4 (four) hours as needed for wheezing or shortness of breath.          Objective:   Physical Exam BP 138/78 (BP Location: Left Wrist, Patient Position: Sitting, Cuff Size: Small)   Pulse 76   Temp 97.7 F (36.5 C) (Oral)   Resp 16   Ht 5\' 3"  (1.6 m)   Wt (!) 309 lb 2 oz (140.2 kg)   LMP 09/15/2016   SpO2 96%   BMI 54.76 kg/m  General:   Well developed, NAD, see BMI.  HEENT:  Normocephalic . Face symmetric, atraumatic Lungs:  CTA B Normal respiratory effort, no intercostal retractions, no accessory muscle use. Heart: RRR,  no murmur.  No pretibial edema bilaterally  Skin: Not pale. Not jaundice DIABETIC FEET EXAM: No lower extremity edema Normal pedal pulses bilaterally Skin normal, nails normal, no calluses Pinprick examination of the feet normal. Neurologic:  alert & oriented X3.  Speech normal, gait appropriate for age and unassisted Psych--  Cognition and judgment appear intact.  Cooperative with normal attention span and concentration.  Behavior appropriate. No anxious or depressed appearing.      Assessment & Plan:    Assessment  DM HTN Asthma Anxiety, depression Morbid obesity Allergic rhinitis Dyspepsia (prn omeprazole) L calf larger : [place] 02-2016 (-)  Plan  DM: Continue metformin, check a A1c HTN: Seems well controlled on losartan, check a CMP High cholesterol: LDL 127, goal 100.  Currently on diet only, check a  FLP. Insomnia: Counseled about good sleep habits, recommend melatonin consistently, call if not better Morbid obesity: Counseled about diet, recommend to see one of the bariatric offices.  Information provided. RTC 4 to 5 months CPX

## 2018-07-27 ENCOUNTER — Other Ambulatory Visit: Payer: Self-pay | Admitting: Internal Medicine

## 2018-08-01 ENCOUNTER — Ambulatory Visit: Payer: BLUE CROSS/BLUE SHIELD | Admitting: Allergy & Immunology

## 2018-08-01 ENCOUNTER — Encounter: Payer: Self-pay | Admitting: Allergy & Immunology

## 2018-08-01 VITALS — BP 132/78 | HR 76 | Temp 97.7°F | Resp 20 | Ht 63.0 in | Wt 309.0 lb

## 2018-08-01 DIAGNOSIS — J302 Other seasonal allergic rhinitis: Secondary | ICD-10-CM

## 2018-08-01 DIAGNOSIS — T781XXD Other adverse food reactions, not elsewhere classified, subsequent encounter: Secondary | ICD-10-CM | POA: Diagnosis not present

## 2018-08-01 DIAGNOSIS — J3089 Other allergic rhinitis: Secondary | ICD-10-CM

## 2018-08-01 DIAGNOSIS — J4541 Moderate persistent asthma with (acute) exacerbation: Secondary | ICD-10-CM | POA: Diagnosis not present

## 2018-08-01 MED ORDER — BUDESONIDE-FORMOTEROL FUMARATE 160-4.5 MCG/ACT IN AERO: 2.0000 | INHALATION_SPRAY | Freq: Two times a day (BID) | RESPIRATORY_TRACT | 5 refills | Status: DC

## 2018-08-01 MED ORDER — FLUTICASONE PROPIONATE 50 MCG/ACT NA SUSP: 2.0000 | Freq: Every day | NASAL | 5 refills | Status: DC

## 2018-08-01 MED ORDER — LEVALBUTEROL TARTRATE 45 MCG/ACT IN AERO: 2.0000 | INHALATION_SPRAY | RESPIRATORY_TRACT | 2 refills | Status: DC | PRN

## 2018-08-01 NOTE — Patient Instructions (Addendum)
1. Moderate persistent asthma - with acute exacerbation - Lung testing looked awful today, but it improved nicely with the nebulizer treatment. - We need to [facility] the healing process with a course of prednisone.  - Spacer sample and demonstration provided. - Daily controller medication(s): Symbicort 160/4.65mcg two puffs twice daily with spacer - Prior to physical activity: Xopenex 2 puffs 10-15 minutes before physical activity. - Rescue medications: Xopenex 4 puffs every 4-6 hours as needed - Asthma control goals:  * Full participation in all desired activities (may need albuterol before activity) * Albuterol use two time or less a week on average (not counting use with activity) * Cough interfering with sleep two time or less a month * Oral steroids no more than once a year * No hospitalizations  2. Perennial and seasonal allergic rhinitis - Testing today showed: trees, weeds, grasses, indoor molds, outdoor molds, dust mites, cat and cockroach - Avoidance measures provided. - Continue with: Flonase (fluticasone) two sprays per nostril daily - Start taking: Allegra (fexofenadine) 180mg  table once daily - You can use an extra dose of the antihistamine, if needed, for breakthrough symptoms.  - Consider nasal saline rinses 1-2 times daily to remove allergens from the nasal cavities as well as help with mucous clearance (this is especially helpful to do before the nasal sprays are given) - Consider allergy shots as a means of long-term control. - Allergy shots "re-train" and "reset" the immune system to ignore environmental allergens and decrease the resulting immune response to those allergens (sneezing, itchy watery eyes, runny nose, nasal congestion, etc).    - Allergy shots improve symptoms in 75-85% of patients.  - We can discuss more at the next appointment if the medications are not working for you.  3. Return in about 3 months (around 11/01/2018).   Please inform us of any  Emergency Department visits, hospitalizations, or changes in symptoms. Call us before going to the ED for breathing or allergy symptoms since we might be able to fit you in for a sick visit. Feel free to contact us anytime with any questions, problems, or concerns.  It was a pleasure to meet you today! You were an absolute delight!   Websites that have reliable patient information: 1. American Academy of Asthma, Allergy, and Immunology: www.aaaai.org 2. Food Allergy Research and Education (FARE): foodallergy.org 3. Mothers of Asthmatics: http://www.asthmacommunitynetwork.org 4. American College of Allergy, Asthma, and Immunology: MonthlyElectricBill.co.uk   Make sure you are registered to vote! If you have moved or changed any of your contact information, you will need to get this updated before voting!       Reducing Pollen Exposure  The American Academy of Allergy, Asthma and Immunology suggests the following steps to reduce your exposure to pollen during allergy seasons.    1. Do not hang sheets or clothing out to dry; pollen may collect on these items. 2. Do not mow lawns or spend time around freshly cut grass; mowing stirs up pollen. 3. Keep windows closed at night.  Keep car windows closed while driving. 4. Minimize morning activities outdoors, a time when pollen counts are usually at their highest. 5. Stay indoors as much as possible when pollen counts or humidity is high and on windy days when pollen tends to remain in the air longer. 6. Use air conditioning when possible.  Many air conditioners have filters that trap the pollen spores. 7. Use a HEPA room air filter to remove pollen form the indoor air you breathe.  Control of Mold Allergen   Mold and fungi can grow on a variety of surfaces provided certain temperature and moisture conditions exist.  Outdoor molds grow on plants, decaying vegetation and soil.  The major outdoor mold, Alternaria and Cladosporium, are found in very high  numbers during hot and dry conditions.  Generally, a late Summer - Fall peak is seen for common outdoor fungal spores.  Rain will temporarily lower outdoor mold spore count, but counts rise rapidly when the rainy period ends.  The most important indoor molds are Aspergillus and Penicillium.  Dark, humid and poorly ventilated basements are ideal sites for mold growth.  The next most common sites of mold growth are the bathroom and the kitchen.  Outdoor (Seasonal) Mold Control  Positive outdoor molds via skin testing: Bipolaris (Helminthsporium), Drechslera (Curvalaria) and Mucor  1. Use air conditioning and keep windows closed 2. Avoid exposure to decaying vegetation. 3. Avoid leaf raking. 4. Avoid grain handling. 5. Consider wearing a face mask if working in moldy areas.  6.   Indoor (Perennial) Mold Control   Positive indoor molds via skin testing: Fusarium, Aureobasidium (Pullulara) and Rhizopus  1. Maintain humidity below 50%. 2. Clean washable surfaces with 5% bleach solution. 3. Remove sources e.g. contaminated carpets.     Control of Dog or Cat Allergen  Avoidance is the best way to manage a dog or cat allergy. If you have a dog or cat and are allergic to dog or cats, consider removing the dog or cat from the home. If you have a dog or cat but don't want to find it a new home, or if your family wants a pet even though someone in the household is allergic, here are some strategies that may help keep symptoms at bay:  1. Keep the pet out of your bedroom and restrict it to only a few rooms. Be advised that keeping the dog or cat in only one room will not limit the allergens to that room. 2. Don't pet, hug or kiss the dog or cat; if you do, wash your hands with soap and water. 3. High-efficiency particulate air (HEPA) cleaners run continuously in a bedroom or living room can reduce allergen levels over time. 4. Regular use of a high-efficiency vacuum cleaner or a central vacuum can  reduce allergen levels. 5. Giving your dog or cat a bath at least once a week can reduce airborne allergen.  Control of Cockroach Allergen  Cockroach allergen has been identified as an important cause of acute attacks of asthma, especially in urban settings.  There are fifty-five species of cockroach that exist in the [place], however only three, the [place], [place] species produce allergen that can affect patients with Asthma.  Allergens can be obtained from fecal particles, egg casings and secretions from cockroaches.    1. Remove food sources. 2. Reduce access to water. 3. Seal access and entry points. 4. Spray runways with 0.5-1% Diazinon or Chlorpyrifos 5. Blow boric acid power under stoves and refrigerator. 6. Place bait stations (hydramethylnon) at feeding sites.  Control of House Dust Mite Allergen    House dust mites play a major role in allergic asthma and rhinitis.  They occur in environments with high humidity wherever human skin, the food for dust mites is found. High levels have been detected in dust obtained from mattresses, pillows, carpets, upholstered furniture, bed covers, clothes and soft toys.  The principal allergen of the house dust mite is found in its  feces.  A gram of dust may contain 1,000 mites and 250,000 fecal particles.  Mite antigen is easily measured in the air during house cleaning activities.    1. Encase mattresses, including the box spring, and pillow, in an air tight cover.  Seal the zipper end of the encased mattresses with wide adhesive tape. 2. Wash the bedding in water of 130 degrees Farenheit weekly.  Avoid cotton comforters/quilts and flannel bedding: the most ideal bed covering is the dacron comforter. 3. Remove all upholstered furniture from the bedroom. 4. Remove carpets, carpet padding, rugs, and non-washable window drapes from the bedroom.  Wash drapes weekly or use plastic window coverings. 5. Remove all non-washable  stuffed toys from the bedroom.  Wash stuffed toys weekly. 6. Have the room cleaned frequently with a vacuum cleaner and a damp dust-mop.  The patient should not be in a room which is being cleaned and should wait 1 hour after cleaning before going into the room. 7. Close and seal all heating outlets in the bedroom.  Otherwise, the room will become filled with dust-laden air.  An electric heater can be used to heat the room. 8. Reduce indoor humidity to less than 50%.  Do not use a humidifier.  Allergy Shots   Allergies are the result of a chain reaction that starts in the immune system. Your immune system controls how your body defends itself. For instance, if you have an allergy to pollen, your immune system identifies pollen as an invader or allergen. Your immune system overreacts by producing antibodies called Immunoglobulin E (IgE). These antibodies travel to cells that release chemicals, causing an allergic reaction.  The concept behind allergy immunotherapy, whether it is received in the form of shots or tablets, is that the immune system can be desensitized to specific allergens that trigger allergy symptoms. Although it requires time and patience, the payback can be long-term relief.  How Do Allergy Shots Work?  Allergy shots work much like a vaccine. Your body responds to injected amounts of a particular allergen given in increasing doses, eventually developing a resistance and tolerance to it. Allergy shots can lead to decreased, minimal or no allergy symptoms.  There generally are two phases: build-up and maintenance. Build-up often ranges from three to six months and involves receiving injections with increasing amounts of the allergens. The shots are typically given once or twice a week, though more rapid build-up schedules are sometimes used.  The maintenance phase begins when the most effective dose is reached. This dose is different for each person, depending on how allergic you are  and your response to the build-up injections. Once the maintenance dose is reached, there are longer periods between injections, typically two to four weeks.  Occasionally doctors give cortisone-type shots that can temporarily reduce allergy symptoms. These types of shots are different and should not be confused with allergy immunotherapy shots.  Who Can Be Treated with Allergy Shots?  Allergy shots may be a good treatment approach for people with allergic rhinitis (hay fever), allergic asthma, conjunctivitis (eye allergy) or stinging insect allergy.   Before deciding to begin allergy shots, you should consider:  . The length of allergy season and the severity of your symptoms . Whether medications and/or changes to your environment can control your symptoms . Your desire to avoid long-term medication use . Time: allergy immunotherapy requires a major time commitment . Cost: may vary depending on your insurance coverage  Allergy shots for children age 75 and older  are effective and often well tolerated. They might prevent the onset of new allergen sensitivities or the progression to asthma.  Allergy shots are not started on patients who are pregnant but can be continued on patients who become pregnant while receiving them. In some patients with other medical conditions or who take certain common medications, allergy shots may be of risk. It is important to mention other medications you talk to your allergist.   When Will I Feel Better?  Some may experience decreased allergy symptoms during the build-up phase. For others, it may take as long as 12 months on the maintenance dose. If there is no improvement after a year of maintenance, your allergist will discuss other treatment options with you.  If you aren't responding to allergy shots, it may be because there is not enough dose of the allergen in your vaccine or there are missing allergens that were not identified during your allergy  testing. Other reasons could be that there are high levels of the allergen in your environment or major exposure to non-allergic triggers like tobacco smoke.  What Is the Length of Treatment?  Once the maintenance dose is reached, allergy shots are generally continued for three to five years. The decision to stop should be discussed with your allergist at that time. Some people may experience a permanent reduction of allergy symptoms. Others may relapse and a longer course of allergy shots can be considered.  What Are the Possible Reactions?  The two types of adverse reactions that can occur with allergy shots are local and systemic. Common local reactions include very mild redness and swelling at the injection site, which can happen immediately or several hours after. A systemic reaction, which is less common, affects the entire body or a particular body system. They are usually mild and typically respond quickly to medications. Signs include increased allergy symptoms such as sneezing, a stuffy nose or hives.  Rarely, a serious systemic reaction called anaphylaxis can develop. Symptoms include swelling in the throat, wheezing, a feeling of tightness in the chest, nausea or dizziness. Most serious systemic reactions develop within 30 minutes of allergy shots. This is why it is strongly recommended you wait in your doctor's office for 30 minutes after your injections. Your allergist is trained to watch for reactions, and his or her staff is trained and equipped with the proper medications to identify and treat them.  Who Should Administer Allergy Shots?  The preferred location for receiving shots is your prescribing allergist's office. Injections can sometimes be given at another facility where the physician and staff are trained to recognize and treat reactions, and have received instructions by your prescribing allergist.

## 2018-08-01 NOTE — Progress Notes (Signed)
NEW PATIENT  Date of Service/Encounter:  08/01/18  Referring provider: Colon Branch, MD   Assessment:   Moderate persistent asthma with acute exacerbation   Seasonal and perennial allergic rhinitis (trees, weeds, grasses, indoor molds, outdoor molds, dust mites, cat and cockroach)  Adverse food reaction (milk) - with negative testing today, therefore likely lactose intolerance   Asthma Reportables:  Severity: moderate persistent  Risk: high Control: very poorly controlled  Plan/Recommendations:   1. Moderate persistent asthma - with acute exacerbation - Lung testing looked awful today, but it improved nicely with the nebulizer treatment. - We need to [facility] the healing process with a course of prednisone.  - Spacer sample and demonstration provided. - Daily controller medication(s): Symbicort 160/4.11mcg two puffs twice daily with spacer - Prior to physical activity: Xopenex 2 puffs 10-15 minutes before physical activity. - Rescue medications: Xopenex 4 puffs every 4-6 hours as needed - Asthma control goals:  * Full participation in all desired activities (may need albuterol before activity) * Albuterol use two time or less a week on average (not counting use with activity) * Cough interfering with sleep two time or less a month * Oral steroids no more than once a year * No hospitalizations  2. Perennial and seasonal allergic rhinitis - Testing today showed: trees, weeds, grasses, indoor molds, outdoor molds, dust mites, cat and cockroach - Avoidance measures provided. - Continue with: Flonase (fluticasone) two sprays per nostril daily - Start taking: Allegra (fexofenadine) 180mg  table once daily - You can use an extra dose of the antihistamine, if needed, for breakthrough symptoms.  - Consider nasal saline rinses 1-2 times daily to remove allergens from the nasal cavities as well as help with mucous clearance (this is especially helpful to do before the nasal sprays  are given) - Consider allergy shots as a means of long-term control. - Allergy shots "re-train" and "reset" the immune system to ignore environmental allergens and decrease the resulting immune response to those allergens (sneezing, itchy watery eyes, runny nose, nasal congestion, etc).    - Allergy shots improve symptoms in 75-85% of patients.  - We can discuss more at the next appointment if the medications are not working for you.  3. Return in about 3 months (around 11/01/2018).  Subjective:   Rachel Norris is a 57 y.o. female presenting today for evaluation of  Chief Complaint  Patient presents with  . Allergies  . Wheezing    Rachel Norris has a history of the following: Patient Active Problem List   Diagnosis Date Noted  . Seasonal and perennial allergic rhinitis 08/01/2018  . Encounter for screening colonoscopy   . PCP NOTES >>> 10/07/2015  . Diabetes ([place]) 07/27/2013  . Annual physical exam 04/24/2012  . Nail lesion 03/14/2012  . Morbid obesity ([place]) 08/29/2009  . Asthma 06/22/2008  . Anxiety and depression 03/07/2007  . Essential hypertension 03/07/2007  . ALLERGIC RHINITIS 03/07/2007    History obtained from: chart review and patient.  Rachel Norris was referred by Colon Branch, MD.     Rachel Norris is a 57 y.o. female presenting for an evaluation of asthma and allergies.    Asthma/Respiratory Symptom History: She carries a diagnosis of asthma, but only has a PRN rescue inhaler. This was initially diagnosed in the 1990s. She is not doing well from an asthma perspective today. She does report bronchitis 1-2 times per year. Last antibiotic course was a few years ago. She smoked from 1980 through 1997. She has  never been admitted to the hospital for breathing purposes.   Allergic Rhinitis Symptom History: She does not take her allergy medication (fluticasone) as needed. She does not use nasal saline rinses regularly. She has been tested years ago by Dr. Smitty Pluck years ago. She was  positive to a number of things, but she does not have the results with her today. Her insurance did not cover the shots at the time, so she did not go through with it. Her husband wanted a cat so they have one now. The cat sleeps on her head at night.   Food Allergy Symptom History: She does report symptoms of abdominal pain with cow's milk, otherwise no systemic symptoms. This seems to be dose dependent. She has never had anaphylaxis to cow's milk at all.   Otherwise, there is no history of other atopic diseases, including drug allergies, stinging insect allergies, or urticaria. There is no significant infectious history. Vaccinations are up to date.    Past Medical History: Patient Active Problem List   Diagnosis Date Noted  . Seasonal and perennial allergic rhinitis 08/01/2018  . Encounter for screening colonoscopy   . PCP NOTES >>> 10/07/2015  . Diabetes ([place]) 07/27/2013  . Annual physical exam 04/24/2012  . Nail lesion 03/14/2012  . Morbid obesity ([place]) 08/29/2009  . Asthma 06/22/2008  . Anxiety and depression 03/07/2007  . Essential hypertension 03/07/2007  . ALLERGIC RHINITIS 03/07/2007    Medication List:  Allergies as of 08/01/2018      Reactions   Sulfa Antibiotics Hives      Medication List        Accurate as of 08/01/18 12:34 PM. Always use your most recent med list.          ACCU-CHEK [name] device CHECK BLOOD SUGAR ONCE DAILY   accu-chek soft touch lancets CHECK BLOOD SUGAR ONCE DAILY   acetaminophen 500 MG tablet Commonly known as:  TYLENOL Take 1,000 mg by mouth every 6 (six) hours as needed (For pain.).   ALEVE 220 MG tablet Generic drug:  naproxen sodium Take 440 mg by mouth every 8 (eight) hours as needed (For pain.).   budesonide-formoterol 160-4.5 MCG/ACT inhaler Commonly known as:  SYMBICORT Inhale 2 puffs into the lungs 2 (two) times daily. Use with spacer device.   fluticasone 50 MCG/ACT nasal spray Commonly known as:  FLONASE Place 2 sprays  into both nostrils daily.   glucose blood test strip Commonly known as:  ACCU-CHEK [name] CHECK BLOOD SUGAR ONCE DAILY   hydrochlorothiazide 25 MG tablet Commonly known as:  HYDRODIURIL Take 1 tablet (25 mg total) by mouth daily.   levalbuterol 45 MCG/ACT inhaler Commonly known as:  XOPENEX HFA Inhale 2 puffs into the lungs every 4 (four) hours as needed for wheezing.   losartan 100 MG tablet Commonly known as:  COZAAR Take 1 tablet (100 mg total) by mouth daily.   medroxyPROGESTERone 5 MG tablet Commonly known as:  PROVERA Take 1 tablet (5mg  total) po daily x5 days every other month until 6 months without bleeding.   metFORMIN 500 MG tablet Commonly known as:  GLUCOPHAGE Take 1 tablet (500 mg total) by mouth 2 (two) times daily with a meal.   omeprazole 40 MG capsule Commonly known as:  PRILOSEC Take 1 capsule (40 mg total) by mouth daily as needed (For heartburn or acid reflux.).   VENTOLIN HFA 108 (90 Base) MCG/ACT inhaler Generic drug:  albuterol Inhale 1 puff into the lungs every 4 (four) hours as  needed for wheezing or shortness of breath.       Birth History: non-contributory.   Developmental History: non-contributory.   Past Surgical History: Past Surgical History:  Procedure Laterality Date  . COLONOSCOPY WITH PROPOFOL N/A 05/15/2016   Procedure: COLONOSCOPY WITH PROPOFOL;  Surgeon: Mauri Pole, MD;  Location: [facility];  Service: Endoscopy;  Laterality: N/A;  . DILATATION & CURETTAGE/HYSTEROSCOPY WITH MYOSURE N/A 08/30/2016   Procedure: DILATATION & CURETTAGE/HYSTEROSCOPY WITH MYOSURE;  Surgeon: Salvadore Dom, MD;  Location: [facility];  Service: Gynecology;  Laterality: N/A;  hysteroscopic myomectomy. BMI 53.8  . DILATION AND CURETTAGE OF UTERUS    . EAR CYST EXCISION Bilateral 04/08/2015   Procedure: removal of bilateral labial inclusion cysts;  Surgeon: Linda Hedges, DO;  Location: [facility];  Service: Gynecology;  Laterality:  Bilateral;  . HYSTEROSCOPY    . HYSTEROSCOPY W/D&C N/A 04/08/2015   Procedure: DILATATION AND CURETTAGE /HYSTEROSCOPY, removal of bilateral labial inclusion cysts;  Surgeon: Linda Hedges, DO;  Location: [facility];  Service: Gynecology;  Laterality: N/A;  . LASER ABLATION OF THE CERVIX  1992   DYSPLAGIA  . MYOMECTOMY N/A 08/30/2016   Procedure: HYSTEROSCOPIC MYOMECTOMY;  Surgeon: Salvadore Dom, MD;  Location: [facility];  Service: Gynecology;  Laterality: N/A;  . 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 Diabetes Mother        M anmd others  . Dementia Mother   . Stroke Mother   . Stroke Father   . AAA (abdominal aortic aneurysm) Father   . Atrial fibrillation Sister   . Asthma Sister   . Allergic rhinitis Sister   . Bronchitis Sister   . Angioedema Sister   . Allergic rhinitis Brother   . Asthma Other   . Allergic rhinitis Sister   . Bronchitis Sister   . Colon cancer Neg Hx   . Breast cancer Neg Hx   . Coronary artery disease Neg Hx   . Eczema Neg Hx   . Urticaria Neg Hx   . Immunodeficiency Neg Hx      Social History: Rachel Norris lives at home with her husband.  They have been married for over 25 years.  They do not have any children, but do host their nieces and nephews fairly often.  They had a dog for quite some time, and when the dog passed away her husband requested that they get a cat.  By this point, they moved into a different house without a yard, therefore a dog was not an option.  They now have a 24-year-old cat named Kitty.  They live in a town home that is [address] years old.  There are hardwoods and carpeting in the main living areas and carpeting in the bedrooms.  They have gas heating and central cooling.  There are dust mite covers on the bed, but not the pillows.  There is no  tobacco smoke exposure at this time, but she did smoke for 17 years, quitting in 1997.  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    Review of Systems: a 14-point review of systems is pertinent for what is mentioned in HPI.  Otherwise, all other systems were negative. Constitutional: negative other than that listed in the HPI Eyes: negative other than that listed in  the HPI Ears, nose, mouth, throat, and face: negative other than that listed in the HPI Respiratory: negative other than that listed in the HPI Cardiovascular: negative other than that listed in the HPI Gastrointestinal: negative other than that listed in the HPI Genitourinary: negative other than that listed in the HPI Integument: negative other than that listed in the HPI Hematologic: negative other than that listed in the HPI Musculoskeletal: negative other than that listed in the HPI Neurological: negative other than that listed in the HPI Allergy/Immunologic: negative other than that listed in the HPI    Objective:   Blood pressure 132/78, pulse 76, temperature 97.7 F (36.5 C), temperature source Oral, resp. rate 20, height 5\' 3"  (1.6 m), weight (!) 309 lb (140.2 kg), last menstrual period 09/15/2016, SpO2 95 %. Body mass index is 54.74 kg/m.   Physical Exam:  General: Alert, interactive, in no acute distress. Talkative.  Eyes: No conjunctival injection bilaterally, no discharge on the right, no discharge on the left and no Horner-Trantas dots present. PERRL bilaterally. EOMI without pain. No photophobia.  Ears: Right TM pearly gray with normal light reflex, Left TM pearly gray with normal light reflex, Right TM intact without perforation and Left TM intact without perforation.  Nose/Throat: External nose within normal limits, nasal crease present and septum midline. Turbinates markedly edematous with clear discharge. Posterior oropharynx erythematous with cobblestoning in the posterior oropharynx.  Tonsils 3+ without exudates.  Tongue without thrush. Neck: Supple without thyromegaly. Trachea midline. Adenopathy: shoddy bilateral anterior cervical lymphadenopathy and no enlarged lymph nodes appreciated in the occipital, axillary, epitrochlear, inguinal, or popliteal regions. Lungs: Decreased breath sounds bilaterally without wheezing, rhonchi or rales. Increased work of breathing. CV: Normal S1/S2. No murmurs. Capillary refill <2 seconds.  Abdomen: Nondistended, nontender. No guarding or rebound tenderness. Bowel sounds present in all fields and hypoactive  Skin: Warm and dry, without lesions or rashes. Extremities:  No clubbing, cyanosis or edema. Neuro:   Grossly intact. No focal deficits appreciated. Responsive to questions.  Diagnostic studies:   Spirometry: results abnormal (FEV1: 1.40/54%, FVC: 1.57/47%, FEV1/FVC: 89%).    Spirometry consistent with possible restrictive disease. Albuterol/Atrovent nebulizer treatment given in clinic with significant improvement in FEV1 and FVC per ATS criteria. Values did not completely normalize, however.   Allergy Studies:   Indoor/Outdoor Percutaneous Adult Environmental Panel: positive to birch, Box elder, red cedar, hickory, pecan pollen, Df mite, Dp mites and cat. Otherwise negative with adequate controls.  Indoor/Outdoor Selected Intradermal Environmental Panel: positive to Grass mix, weed mix, mold mix #1, mold mix #4 and cockroach. Otherwise negative with adequate controls.  Selected Foods Panel: negative to cow's milk and casein with adequate controls.     Allergy testing results were read and interpreted by myself, documented by clinical staff.       Salvatore Marvel, MD [facility]

## 2018-09-11 ENCOUNTER — Encounter: Payer: Self-pay | Admitting: Internal Medicine

## 2018-09-11 ENCOUNTER — Ambulatory Visit: Payer: BLUE CROSS/BLUE SHIELD | Admitting: Internal Medicine

## 2018-09-11 VITALS — BP 132/84 | HR 89 | Temp 99.4°F | Wt 306.4 lb

## 2018-09-11 DIAGNOSIS — B349 Viral infection, unspecified: Secondary | ICD-10-CM

## 2018-09-11 NOTE — Progress Notes (Signed)
Subjective:    Patient ID: Rachel Norris, female    DOB: 02-May-1961, 57 y.o.   MRN: [id number]  DOS:  09/11/2018 Type of visit - description :  Interval history: Started to feel unwell last week.  On 09/08/2018, she definitely felt worse, no energy, bloated, asthma was acting have he used albuterol few times. She also had aches and pains. The next day she felt well Symptoms resurface 09/10/2018 until today: low energy, feverish, some nausea.  Chills and feeling freezing.  Wheezing has not resurfaced.  Review of Systems Denies sore throat, mild runny nose. Mild headaches.   Past Medical History:  Diagnosis Date  . Allergic rhinitis   . Ankle swelling   . Anxiety   . Anxiety and depression   . Arthritis    generalized arthritis -knees, feet.back.   . 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 At risk for sleep apnea    STOP-BANG= 5            SENT TO PCP 04-05-2015  . Bronchitis    Bronchitis a few months ago- no issues now. - no Inhalers used daily.  Tennis Must Quervain's tenosynovitis, right   . Depression   . Diabetes mellitus without complication ([place])    "pre diabetes"  . Fibroid   . GERD (gastroesophageal reflux disease)   . Heart palpitations   . Hormone disorder   . Hypertension   . Irregular menstrual bleeding   . Labial cyst    bilateral icclusion  . Perimenopausal   . PONV (postoperative nausea and vomiting)   . Pre-diabetes   . Prediabetes   . Shortness of breath dyspnea    with exertion, climbing a flight of stairs  . Thickened endometrium   . Wears glasses     Past Surgical History:  Procedure Laterality Date  . COLONOSCOPY WITH PROPOFOL N/A 05/15/2016   Procedure: COLONOSCOPY WITH PROPOFOL;  Surgeon: Mauri Pole, MD;  [facility];  Service: Endoscopy;  Laterality: N/A;  . DILATATION & CURETTAGE/HYSTEROSCOPY WITH MYOSURE N/A 08/30/2016   Procedure: DILATATION & CURETTAGE/HYSTEROSCOPY WITH MYOSURE;  Surgeon: Salvadore Dom, MD;   Location: [facility];  Service: Gynecology;  Laterality: N/A;  hysteroscopic myomectomy. BMI 53.8  . DILATION AND CURETTAGE OF UTERUS    . EAR CYST EXCISION Bilateral 04/08/2015   Procedure: removal of bilateral labial inclusion cysts;  Surgeon: Linda Hedges, DO;  Location: [facility];  Service: Gynecology;  Laterality: Bilateral;  . HYSTEROSCOPY    . HYSTEROSCOPY W/D&C N/A 04/08/2015   Procedure: DILATATION AND CURETTAGE /HYSTEROSCOPY, removal of bilateral labial inclusion cysts;  Surgeon: Linda Hedges, DO;  Location: [facility];  Service: Gynecology;  Laterality: N/A;  . LASER ABLATION OF THE CERVIX  1992   DYSPLAGIA  . MYOMECTOMY N/A 08/30/2016   Procedure: HYSTEROSCOPIC MYOMECTOMY;  Surgeon: Salvadore Dom, MD;  Location: [facility];  Service: Gynecology;  Laterality: N/A;  . 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 Marital status: Married    Spouse name: Not on file  . Number of children: 0  . Years of education: Not on file  . Highest education level: Not on file  Occupational History  . 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 Financial  resource strain: Not on file  . Food insecurity:    Worry: Not on file    Inability: Not on file  . Transportation needs:    Medical: Not on file    Non-medical: Not on file  Tobacco Use  . Smoking status: Former Smoker    Packs/day: 1.00    Years: 20.00    Pack years: 20.00    Types: Cigarettes    Last attempt to quit: 04/04/1996    Years since quitting: 22.4  . Smokeless tobacco: Never Used  Substance and Sexual Activity  . Alcohol use: Yes    Alcohol/week: 0.0 standard drinks    Comment: rare 6 per year  . Drug use: No  . Sexual activity: Not Currently    Partners: Male  Lifestyle  . Physical activity:     Days per week: Not on file    Minutes per session: Not on file  . Stress: Not on file  Relationships  . Social connections:    Talks on phone: Not on file    Gets together: Not on file    Attends religious service: Not on file    Active member of club or organization: Not on file    Attends meetings of clubs or organizations: Not on file    Relationship status: Not on file  . Intimate partner violence:    Fear of current or ex partner: Not on file    Emotionally abused: Not on file    Physically abused: Not on file    Forced sexual activity: Not on file  Other Topics Concern  . Not on file  Social History Narrative   Lives w/ husband          Allergies as of 09/11/2018      Reactions   Sulfa Antibiotics Hives      Medication List        Accurate as of 09/11/18 11:59 PM. Always use your most recent med list.          ACCU-CHEK [name] device CHECK BLOOD SUGAR ONCE DAILY   accu-chek soft touch lancets CHECK BLOOD SUGAR ONCE DAILY   acetaminophen 500 MG tablet Commonly known as:  TYLENOL Take 1,000 mg by mouth every 6 (six) hours as needed (For pain.).   ALEVE 220 MG tablet Generic drug:  naproxen sodium Take 440 mg by mouth every 8 (eight) hours as needed (For pain.).   budesonide-formoterol 160-4.5 MCG/ACT inhaler Commonly known as:  SYMBICORT Inhale 2 puffs into the lungs 2 (two) times daily. Use with spacer device.   fluticasone 50 MCG/ACT nasal spray Commonly known as:  FLONASE Place 2 sprays into both nostrils daily.   glucose blood test strip CHECK BLOOD SUGAR ONCE DAILY   hydrochlorothiazide 25 MG tablet Commonly known as:  HYDRODIURIL Take 1 tablet (25 mg total) by mouth daily.   levalbuterol 45 MCG/ACT inhaler Commonly known as:  XOPENEX HFA Inhale 2 puffs into the lungs every 4 (four) hours as needed for wheezing.   losartan 100 MG tablet Commonly known as:  COZAAR Take 1 tablet (100 mg total) by mouth daily.   medroxyPROGESTERone 5 MG  tablet Commonly known as:  PROVERA Take 1 tablet (5mg  total) po daily x5 days every other month until 6 months without bleeding.   metFORMIN 500 MG tablet Commonly known as:  GLUCOPHAGE Take 1 tablet (500 mg total) by mouth 2 (two) times daily with a meal.   omeprazole 40 MG capsule Commonly known as:  PRILOSEC Take 1 capsule (40 mg total) by mouth daily as needed (For heartburn or acid reflux.).   VENTOLIN HFA 108 (90 Base) MCG/ACT inhaler Generic drug:  albuterol Inhale 1 puff into the lungs every 4 (four) hours as needed for wheezing or shortness of breath.          Objective:   Physical Exam BP 132/84 (BP Location: Right Arm, Patient Position: Sitting, Cuff Size: Large)   Pulse 89   Temp 99.4 F (37.4 C) (Oral)   Wt (!) 306 lb 6 oz (139 kg)   LMP 09/15/2016   SpO2 97%   BMI 54.27 kg/m  General:   Well developed, NAD, see BMI.  HEENT:  Normocephalic . Face symmetric, atraumatic.  Nose is slightly congested, sinuses no TTP.  TMs normal.  Throat symmetric. Lungs:  CTA B Normal respiratory effort, no intercostal retractions, no accessory muscle use. Heart: RRR,  no murmur.  No pretibial edema bilaterally  Skin: Not pale. Not jaundice Neurologic:  alert & oriented X3.  Speech normal, gait appropriate for age and unassisted Psych--  Cognition and judgment appear intact.  Cooperative with normal attention span and concentration.  Behavior appropriate. No anxious or depressed appearing.      Assessment & Plan:  Assessment  DM HTN Asthma Anxiety, depression Morbid obesity +pre-op OSA screen 2017, (-) sx 06/2017 Allergic rhinitis Dyspepsia (prn omeprazole) L calf larger : [place] 02-2016 (-)  Plan  Viral syndrome: Based on symptoms, suspect a viral syndrome, flu test negative. No red flags, exam is benign, recommend conservative treatment, continue inhalers, call if worse.  See instructions.

## 2018-09-11 NOTE — Patient Instructions (Addendum)
Rest, fluids , tylenol  For cough:  Take Mucinex DM twice a day as needed until better  For nasal congestion: Use  Flonase : 2 nasal sprays on each side of the nose in the morning until you feel better   Avoid decongestants such as  Pseudoephedrine or phenylephrine   Use the inhalers as needed.  Call if not gradually better over the next  10 days  Call anytime if the symptoms are severe

## 2018-09-12 NOTE — Assessment & Plan Note (Signed)
Viral syndrome: Based on symptoms, suspect a viral syndrome, flu test negative. No red flags, exam is benign, recommend conservative treatment, continue inhalers, call if worse.  See instructions.

## 2018-11-07 ENCOUNTER — Ambulatory Visit: Payer: BLUE CROSS/BLUE SHIELD | Admitting: Allergy & Immunology

## 2019-01-05 ENCOUNTER — Other Ambulatory Visit: Payer: Self-pay | Admitting: Internal Medicine

## 2019-01-23 LAB — HM DIABETES EYE EXAM

## 2019-01-29 ENCOUNTER — Other Ambulatory Visit (HOSPITAL_BASED_OUTPATIENT_CLINIC_OR_DEPARTMENT_OTHER): Payer: Self-pay | Admitting: Obstetrics and Gynecology

## 2019-01-29 DIAGNOSIS — Z1231 Encounter for screening mammogram for malignant neoplasm of breast: Secondary | ICD-10-CM

## 2019-01-30 ENCOUNTER — Encounter: Payer: Self-pay | Admitting: Internal Medicine

## 2019-01-30 ENCOUNTER — Ambulatory Visit: Payer: BLUE CROSS/BLUE SHIELD | Admitting: Internal Medicine

## 2019-01-30 VITALS — BP 132/70 | HR 79 | Temp 97.7°F | Resp 16 | Ht 63.0 in | Wt 312.4 lb

## 2019-01-30 DIAGNOSIS — J4520 Mild intermittent asthma, uncomplicated: Secondary | ICD-10-CM

## 2019-01-30 DIAGNOSIS — M2550 Pain in unspecified joint: Secondary | ICD-10-CM

## 2019-01-30 DIAGNOSIS — E119 Type 2 diabetes mellitus without complications: Secondary | ICD-10-CM

## 2019-01-30 DIAGNOSIS — I10 Essential (primary) hypertension: Secondary | ICD-10-CM

## 2019-01-30 LAB — CBC WITH DIFFERENTIAL/PLATELET
Basophils Absolute: 0.1 10*3/uL (ref 0.0–0.1)
Basophils Relative: 1 % (ref 0.0–3.0)
EOS ABS: 0.2 10*3/uL (ref 0.0–0.7)
Eosinophils Relative: 3.2 % (ref 0.0–5.0)
HEMATOCRIT: 39 % (ref 36.0–46.0)
Hemoglobin: 13.3 g/dL (ref 12.0–15.0)
LYMPHS PCT: 22.1 % (ref 12.0–46.0)
Lymphs Abs: 1.4 10*3/uL (ref 0.7–4.0)
MCHC: 34.2 g/dL (ref 30.0–36.0)
MCV: 95 fl (ref 78.0–100.0)
MONOS PCT: 9.9 % (ref 3.0–12.0)
Monocytes Absolute: 0.6 10*3/uL (ref 0.1–1.0)
NEUTROS ABS: 4.2 10*3/uL (ref 1.4–7.7)
Neutrophils Relative %: 63.8 % (ref 43.0–77.0)
PLATELETS: 335 10*3/uL (ref 150.0–400.0)
RBC: 4.1 Mil/uL (ref 3.87–5.11)
RDW: 13.2 % (ref 11.5–15.5)
WBC: 6.5 10*3/uL (ref 4.0–10.5)

## 2019-01-30 LAB — B12 AND FOLATE PANEL
FOLATE: 14.2 ng/mL (ref >5.9)
Vitamin B-12: 481 pg/mL (ref 211–911)

## 2019-01-30 LAB — BASIC METABOLIC PANEL
BUN: 15 mg/dL (ref 6–23)
CALCIUM: 9.4 mg/dL (ref 8.4–10.5)
CO2: 27 mEq/L (ref 19–32)
CREATININE: 0.52 mg/dL (ref 0.40–1.20)
Chloride: 100 mEq/L (ref 96–112)
GFR: 121.94 mL/min (ref >60.00)
Glucose, Bld: 119 mg/dL — ABNORMAL HIGH (ref 70–99)
Potassium: 3.8 mEq/L (ref 3.5–5.1)
Sodium: 137 mEq/L (ref 135–145)

## 2019-01-30 LAB — SEDIMENTATION RATE: Sed Rate: 23 mm/hr (ref 0–30)

## 2019-01-30 LAB — CK: Total CK: 59 U/L (ref 7–177)

## 2019-01-30 LAB — HEMOGLOBIN A1C: Hgb A1c MFr Bld: 6.7 % — ABNORMAL HIGH (ref 4.6–6.5)

## 2019-01-30 LAB — LIPID PANEL
CHOL/HDL RATIO: 4
Cholesterol: 201 mg/dL — ABNORMAL HIGH (ref 0–200)
HDL: 47.2 mg/dL (ref >39.00)
LDL CALC: 134 mg/dL — AB (ref 0–99)
NonHDL: 153.58
TRIGLYCERIDES: 99 mg/dL (ref 0.0–149.0)
VLDL: 19.8 mg/dL (ref 0.0–40.0)

## 2019-01-30 LAB — TSH: TSH: 2.82 u[IU]/mL (ref 0.35–4.50)

## 2019-01-30 MED ORDER — ACCU-CHEK SOFT TOUCH LANCETS MISC: 12 refills | Status: DC

## 2019-01-30 MED ORDER — GLUCOSE BLOOD VI STRP: ORAL_STRIP | 12 refills | Status: DC

## 2019-01-30 NOTE — Progress Notes (Signed)
Pre visit review using our clinic review tool, if applicable. No additional management support is needed unless otherwise documented below in the visit note. 

## 2019-01-30 NOTE — Progress Notes (Signed)
Subjective:    Patient ID: Rachel Norris, female    DOB: 1961-10-14, 58 y.o.   MRN: [id number]  DOS:  01/30/2019 Type of visit - description: Routine visit Morbid obesity: No major changes on her lifestyle, has gained weight. DM: On metformin, likes B12 levels check.  No recent ambulatory CBGs. Complains of aches and pains, mostly at the hips, feet and legs.  She is a [employer] certified accountant, and walks many hours a day at her job. Depression: Her PHQ 9 is around 9 but she feels well emotionally Asthma: Not issue at this point HTN: Good med compliance  Wt Readings from Last 3 Encounters:  01/30/19 (!) 312 lb 6 oz (141.7 kg)  09/11/18 (!) 306 lb 6 oz (139 kg)  08/01/18 (!) 309 lb (140.2 kg)     Review of Systems Denies fever chills Has some generalized joint stiffness in the morning but sxs last about 20 to 30 minutes. No headaches No weight loss Hands and wrists sometimes are slightly achy and puffy.  Past Medical History:  Diagnosis Date  . Allergic rhinitis   . Ankle swelling   . Anxiety   . Anxiety and depression   . Arthritis    generalized arthritis -knees, feet.back.   . 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 At risk for sleep apnea    STOP-BANG= 5            SENT TO PCP 04-05-2015  . Bronchitis    Bronchitis a few months ago- no issues now. - no Inhalers used daily.  Tennis Must Quervain's tenosynovitis, right   . Depression   . Diabetes mellitus without complication ([place])    "pre diabetes"  . Fibroid   . GERD (gastroesophageal reflux disease)   . Heart palpitations   . Hormone disorder   . Hypertension   . Irregular menstrual bleeding   . Labial cyst    bilateral icclusion  . Perimenopausal   . PONV (postoperative nausea and vomiting)   . Pre-diabetes   . Prediabetes   . Shortness of breath dyspnea    with exertion, climbing a flight of stairs  . Thickened endometrium   . Wears glasses     Past Surgical History:  Procedure Laterality Date  .  COLONOSCOPY WITH PROPOFOL N/A 05/15/2016   Procedure: COLONOSCOPY WITH PROPOFOL;  Surgeon: Mauri Pole, MD;  Location: [facility];  Service: Endoscopy;  Laterality: N/A;  . DILATATION & CURETTAGE/HYSTEROSCOPY WITH MYOSURE N/A 08/30/2016   Procedure: DILATATION & CURETTAGE/HYSTEROSCOPY WITH MYOSURE;  Surgeon: Salvadore Dom, MD;  Location: [facility];  Service: Gynecology;  Laterality: N/A;  hysteroscopic myomectomy. BMI 53.8  . DILATION AND CURETTAGE OF UTERUS    . EAR CYST EXCISION Bilateral 04/08/2015   Procedure: removal of bilateral labial inclusion cysts;  Surgeon: Linda Hedges, DO;  Location: [facility];  Service: Gynecology;  Laterality: Bilateral;  . HYSTEROSCOPY    . HYSTEROSCOPY W/D&C N/A 04/08/2015   Procedure: DILATATION AND CURETTAGE /HYSTEROSCOPY, removal of bilateral labial inclusion cysts;  Surgeon: Linda Hedges, DO;  Location: [facility];  Service: Gynecology;  Laterality: N/A;  . LASER ABLATION OF THE CERVIX  1992   DYSPLAGIA  . MYOMECTOMY N/A 08/30/2016   Procedure: HYSTEROSCOPIC MYOMECTOMY;  Surgeon: Salvadore Dom, MD;  Location: [facility];  Service: Gynecology;  Laterality: N/A;  . 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 Marital status: Married    Spouse name: Not on file  . Number of children: 0  . Years of education: Not on file  . Highest education level: Not on file  Occupational History  . 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 Financial resource strain: Not on file  . Food insecurity:    Worry: Not on file    Inability: Not on file  . Transportation needs:    Medical: Not on file    Non-medical: Not on file  Tobacco Use  . Smoking status: Former Smoker    Packs/day: 1.00    Years: 20.00    Pack years: 20.00     Types: Cigarettes    Last attempt to quit: 04/04/1996    Years since quitting: 22.8  . Smokeless tobacco: Never Used  Substance and Sexual Activity  . Alcohol use: Yes    Alcohol/week: 0.0 standard drinks    Comment: rare 6 per year  . Drug use: No  . Sexual activity: Not Currently    Partners: Male  Lifestyle  . Physical activity:    Days per week: Not on file    Minutes per session: Not on file  . Stress: Not on file  Relationships  . Social connections:    Talks on phone: Not on file    Gets together: Not on file    Attends religious service: Not on file    Active member of club or organization: Not on file    Attends meetings of clubs or organizations: Not on file    Relationship status: Not on file  . Intimate partner violence:    Fear of current or ex partner: Not on file    Emotionally abused: Not on file    Physically abused: Not on file    Forced sexual activity: Not on file  Other Topics Concern  . Not on file  Social History Narrative   Lives w/ husband          Allergies as of 01/30/2019      Reactions   Sulfa Antibiotics Hives      Medication List       Accurate as of January 30, 2019 11:59 PM. Always use your most recent med list.        ACCU-CHEK [name] device CHECK BLOOD SUGAR ONCE DAILY   accu-chek soft touch lancets CHECK BLOOD SUGAR ONCE DAILY   acetaminophen 500 MG tablet Commonly known as:  TYLENOL Take 1,000 mg by mouth every 6 (six) hours as needed (For pain.).   ALEVE 220 MG tablet Generic drug:  naproxen sodium Take 440 mg by mouth every 8 (eight) hours as needed (For pain.).   budesonide-formoterol 160-4.5 MCG/ACT inhaler Commonly known as:  SYMBICORT Inhale 2 puffs into the lungs 2 (two) times daily. Use with spacer device.   fluticasone 50 MCG/ACT nasal spray Commonly known as:  FLONASE Place 2 sprays into both nostrils daily.   glucose blood test strip Commonly known as:  ACCU-CHEK [name] CHECK BLOOD SUGAR ONCE DAILY     hydrochlorothiazide 25 MG tablet Commonly known as:  HYDRODIURIL Take 1 tablet (25 mg total) by mouth daily.   levalbuterol 45 MCG/ACT inhaler Commonly known as:  XOPENEX HFA Inhale 2 puffs into the lungs every 4 (four) hours as needed for wheezing.   losartan 100  MG tablet Commonly known as:  COZAAR Take 1 tablet (100 mg total) by mouth daily.   metFORMIN 500 MG tablet Commonly known as:  GLUCOPHAGE Take 1 tablet (500 mg total) by mouth 2 (two) times daily with a meal.   omeprazole 40 MG capsule Commonly known as:  PRILOSEC Take 1 capsule (40 mg total) by mouth daily as needed (For heartburn or acid reflux.).   VENTOLIN HFA 108 (90 Base) MCG/ACT inhaler Generic drug:  albuterol Inhale 1 puff into the lungs every 4 (four) hours as needed for wheezing or shortness of breath.   vitamin B-12 1000 MCG tablet Commonly known as:  CYANOCOBALAMIN Take 1,000 mcg by mouth daily.           Objective:   Physical Exam BP 132/70 (BP Location: Left Wrist, Patient Position: Sitting, Cuff Size: Small)   Pulse 79   Temp 97.7 F (36.5 C) (Oral)   Resp 16   Ht 5\' 3"  (1.6 m)   Wt (!) 312 lb 6 oz (141.7 kg)   LMP 09/15/2016   SpO2 96%   BMI 55.33 kg/m  General:   Well developed, NAD, BMI noted. HEENT:  Normocephalic . Face symmetric, atraumatic Lungs:  CTA B Normal respiratory effort, no intercostal retractions, no accessory muscle use. Heart: RRR,  no murmur.  No pretibial edema bilaterally.  Left leg is a slightly larger, at baseline MSK: Hands and wrists: No synovitis Hips: Rotation and range of motion normal, right trochanteric bursa slightly TTP Skin: Not pale. Not jaundice Neurologic:  alert & oriented X3.  Speech normal, gait appropriate for age and unassisted Psych--  Cognition and judgment appear intact.  Cooperative with normal attention span and concentration.  Behavior appropriate. No anxious or depressed appearing.      Assessment     Assessment   DM HTN Asthma Allergic rhinitis- sees allergist  Anxiety, depression Morbid obesity +pre-op OSA screen 2017, (-) sx 06/2017 Dyspepsia (prn omeprazole) L calf larger : [place] 02-2016 (-)  Plan  DM: Currently on metformin.  Check A1c.  We will also check a Y65 folic acid and vitamin D. HTN: Good compliance with losartan.  Check a BMP and CBC Asthma: Not an issue, on inhalers as needed. Morbid obesity: No recent action to lose weight.  She does realize the importance of losing weight.  We talk about weight watchers, the wellness clinic and bariatric surgery.  Strongly encouraged to think about those options.  Check TSH Arthralgias: Mostly in the lower extremities, no synovitis on the upper extremities.  Suspect sxs related to BMI and the fact that she walks constantly at work.  For completeness will check a sed rate and a CK.  She is a slightly tender at the right trochanteric bursa, recommend ice/Aleve / Tylenol.  Call if not better. Anxiety depression: Her PHQ 9 is 8-9, she is doing well emotionally, she is just busy at work. Preventive care: Had a colonoscopy in 2017, flu shot at work, mammogram is scheduled for next week, sees gynecology regularly. RTC CPX 6 months

## 2019-01-30 NOTE — Patient Instructions (Addendum)
Per our records you are due for an eye exam. Please contact your eye doctor to schedule an appointment. Please have them send copies of your office visit notes to [place]. Our fax number is [phone].    GO TO THE LAB : Get the blood work     GO TO THE FRONT DESK Schedule your next appointment   physical exam, fasting in 6 months

## 2019-01-31 ENCOUNTER — Other Ambulatory Visit: Payer: Self-pay | Admitting: Internal Medicine

## 2019-01-31 NOTE — Assessment & Plan Note (Signed)
DM: Currently on metformin.  Check A1c.  We will also check a P53 folic acid and vitamin D. HTN: Good compliance with losartan.  Check a BMP and CBC Asthma: Not an issue, on inhalers as needed. Morbid obesity: No recent action to lose weight.  She does realize the importance of losing weight.  We talk about weight watchers, the wellness clinic and bariatric surgery.  Strongly encouraged to think about those options.  Check TSH Arthralgias: Mostly in the lower extremities, no synovitis on the upper extremities.  Suspect sxs related to BMI and the fact that she walks constantly at work.  For completeness will check a sed rate and a CK.  She is a slightly tender at the right trochanteric bursa, recommend ice/Aleve / Tylenol.  Call if not better. Anxiety depression: Her PHQ 9 is 8-9, she is doing well emotionally, she is just busy at work. Preventive care: Had a colonoscopy in 2017, flu shot at work, mammogram is scheduled for next week, sees gynecology regularly. RTC CPX 6 months

## 2019-02-02 ENCOUNTER — Other Ambulatory Visit: Payer: Self-pay | Admitting: Allergy & Immunology

## 2019-02-04 MED ORDER — HYDROCHLOROTHIAZIDE 25 MG PO TABS: 25.0000 mg | ORAL_TABLET | Freq: Every day | ORAL | 1 refills | Status: DC

## 2019-02-04 MED ORDER — OMEPRAZOLE 40 MG PO CPDR: 40.0000 mg | DELAYED_RELEASE_CAPSULE | Freq: Every day | ORAL | 3 refills | Status: DC | PRN

## 2019-02-04 MED ORDER — LOSARTAN POTASSIUM 100 MG PO TABS: 100.0000 mg | ORAL_TABLET | Freq: Every day | ORAL | 1 refills | Status: DC

## 2019-02-04 NOTE — Addendum Note (Signed)
Addended by: Damita Dunnings D on: 02/04/2019 11:53 AM   Modules accepted: Orders

## 2019-02-05 LAB — VITAMIN D 1,25 DIHYDROXY
VITAMIN D 1, 25 (OH) TOTAL: 39 pg/mL (ref 18–72)
Vitamin D2 1, 25 (OH)2: <8 pg/mL
Vitamin D3 1, 25 (OH)2: 39 pg/mL

## 2019-02-06 ENCOUNTER — Ambulatory Visit (HOSPITAL_BASED_OUTPATIENT_CLINIC_OR_DEPARTMENT_OTHER)
Admission: RE | Admit: 2019-02-06 | Discharge: 2019-02-06 | Disposition: A | Discharge status: Home / Self Care | Payer: BLUE CROSS/BLUE SHIELD | Source: Ambulatory Visit | Attending: Obstetrics and Gynecology | Admitting: Obstetrics and Gynecology

## 2019-02-06 ENCOUNTER — Encounter (HOSPITAL_BASED_OUTPATIENT_CLINIC_OR_DEPARTMENT_OTHER): Payer: Self-pay

## 2019-02-06 DIAGNOSIS — Z1231 Encounter for screening mammogram for malignant neoplasm of breast: Secondary | ICD-10-CM | POA: Diagnosis not present

## 2019-04-28 ENCOUNTER — Telehealth: Payer: Self-pay

## 2019-04-28 MED ORDER — FLUTICASONE PROPIONATE 50 MCG/ACT NA SUSP: 2.0000 | Freq: Every day | NASAL | 0 refills | Status: DC

## 2019-04-28 NOTE — Telephone Encounter (Signed)
Final courtesy refill. 

## 2019-05-13 ENCOUNTER — Encounter: Payer: Self-pay | Admitting: Internal Medicine

## 2019-05-13 ENCOUNTER — Telehealth: Payer: Self-pay

## 2019-05-13 ENCOUNTER — Other Ambulatory Visit: Payer: Self-pay

## 2019-05-13 ENCOUNTER — Ambulatory Visit: Payer: BLUE CROSS/BLUE SHIELD | Admitting: Internal Medicine

## 2019-05-13 VITALS — BP 126/72 | HR 77 | Temp 98.4°F | Resp 16 | Ht 63.0 in | Wt 318.0 lb

## 2019-05-13 DIAGNOSIS — R229 Localized swelling, mass and lump, unspecified: Secondary | ICD-10-CM

## 2019-05-13 DIAGNOSIS — I10 Essential (primary) hypertension: Secondary | ICD-10-CM | POA: Diagnosis not present

## 2019-05-13 NOTE — Progress Notes (Signed)
Pre visit review using our clinic review tool, if applicable. No additional management support is needed unless otherwise documented below in the visit note. 

## 2019-05-13 NOTE — Patient Instructions (Signed)
  GO TO THE FRONT DESK Schedule your next appointment

## 2019-05-13 NOTE — Telephone Encounter (Signed)
Okay to schedule appt this PM as long as she has no symptoms- PCP will need to look at leg in person.

## 2019-05-13 NOTE — Telephone Encounter (Signed)
Pt scheduled today at 2:00. Done

## 2019-05-13 NOTE — Progress Notes (Signed)
Subjective:    Patient ID: Rachel Norris, female    DOB: [birth year], 58 y.o.   MRN: [id number]  DOS:  05/13/2019 Type of visit - description: Acute visit, face-to-face 4 weeks ago noted a lump at the right pretibial area. Denies any injury or pain. We review her medication list, good compliance with meds Ambulatory BPs normal when checked   Review of Systems No fever chills No redness on the pretibial area  Past Medical History:  Diagnosis Date  . Allergic rhinitis   . Ankle swelling   . Anxiety   . Anxiety and depression   . Arthritis    generalized arthritis -knees, feet.back.   . 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 At risk for sleep apnea    STOP-BANG= 5            SENT TO PCP 04-05-2015  . Bronchitis    Bronchitis a few months ago- no issues now. - no Inhalers used daily.  Tennis Must Quervain's tenosynovitis, right   . Depression   . Diabetes mellitus without complication ([place])    "pre diabetes"  . Fibroid   . GERD (gastroesophageal reflux disease)   . Heart palpitations   . Hormone disorder   . Hypertension   . Irregular menstrual bleeding   . Labial cyst    bilateral icclusion  . Perimenopausal   . PONV (postoperative nausea and vomiting)   . Pre-diabetes   . Prediabetes   . Shortness of breath dyspnea    with exertion, climbing a flight of stairs  . Thickened endometrium   . Wears glasses     Past Surgical History:  Procedure Laterality Date  . COLONOSCOPY WITH PROPOFOL N/A 05/15/2016   Procedure: COLONOSCOPY WITH PROPOFOL;  Surgeon: Mauri Pole, MD;  [facility];  Service: Endoscopy;  Laterality: N/A;  . DILATATION & CURETTAGE/HYSTEROSCOPY WITH MYOSURE N/A 08/30/2016   Procedure: DILATATION & CURETTAGE/HYSTEROSCOPY WITH MYOSURE;  Surgeon: Salvadore Dom, MD;  Location: [facility];  Service: Gynecology;  Laterality: N/A;  hysteroscopic myomectomy. BMI 53.8  . DILATION AND CURETTAGE OF UTERUS    . EAR CYST EXCISION Bilateral  04/08/2015   Procedure: removal of bilateral labial inclusion cysts;  Surgeon: Linda Hedges, DO;  Location: [facility];  Service: Gynecology;  Laterality: Bilateral;  . HYSTEROSCOPY    . HYSTEROSCOPY W/D&C N/A 04/08/2015   Procedure: DILATATION AND CURETTAGE /HYSTEROSCOPY, removal of bilateral labial inclusion cysts;  Surgeon: Linda Hedges, DO;  Location: [facility];  Service: Gynecology;  Laterality: N/A;  . LASER ABLATION OF THE CERVIX  1992   DYSPLAGIA  . MYOMECTOMY N/A 08/30/2016   Procedure: HYSTEROSCOPIC MYOMECTOMY;  Surgeon: Salvadore Dom, MD;  Location: [facility];  Service: Gynecology;  Laterality: N/A;  . 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 Marital status: Married    Spouse name: Not on file  . Number of children: 0  . Years of education: Not on file  . Highest education level: Not on file  Occupational History  . 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 Financial resource strain: Not on file  . Food insecurity:    Worry: Not on file    Inability: Not on file  . Transportation  needs:    Medical: Not on file    Non-medical: Not on file  Tobacco Use  . Smoking status: Former Smoker    Packs/day: 1.00    Years: 20.00    Pack years: 20.00    Types: Cigarettes    Last attempt to quit: 04/04/1996    Years since quitting: 23.1  . Smokeless tobacco: Never Used  Substance and Sexual Activity  . Alcohol use: Yes    Alcohol/week: 0.0 standard drinks    Comment: rare 6 per year  . Drug use: No  . Sexual activity: Not Currently    Partners: Male  Lifestyle  . Physical activity:    Days per week: Not on file    Minutes per session: Not on file  . Stress: Not on file  Relationships  . Social connections:    Talks on phone: Not on file     Gets together: Not on file    Attends religious service: Not on file    Active member of club or organization: Not on file    Attends meetings of clubs or organizations: Not on file    Relationship status: Not on file  . Intimate partner violence:    Fear of current or ex partner: Not on file    Emotionally abused: Not on file    Physically abused: Not on file    Forced sexual activity: Not on file  Other Topics Concern  . Not on file  Social History Narrative   Lives w/ husband          Allergies as of 05/13/2019      Reactions   Sulfa Antibiotics Hives      Medication List       Accurate as of May 13, 2019 11:59 PM. If you have any questions, ask your nurse or doctor.        accu-chek soft touch lancets CHECK BLOOD SUGAR ONCE DAILY   acetaminophen 500 MG tablet Commonly known as:  TYLENOL Take 1,000 mg by mouth every 6 (six) hours as needed (For pain.).   Aleve 220 MG tablet Generic drug:  naproxen sodium Take 440 mg by mouth every 8 (eight) hours as needed (For pain.).   budesonide-formoterol 160-4.5 MCG/ACT inhaler Commonly known as:  Symbicort Inhale 2 puffs into the lungs 2 (two) times daily. Use with spacer device.   fluticasone 50 MCG/ACT nasal spray Commonly known as:  FLONASE Place 2 sprays into both nostrils daily.   glucose blood test strip Commonly known as:  Accu-Chek [name] CHECK BLOOD SUGAR ONCE DAILY   hydrochlorothiazide 25 MG tablet Commonly known as:  HYDRODIURIL Take 1 tablet (25 mg total) by mouth daily.   levalbuterol 45 MCG/ACT inhaler Commonly known as:  Xopenex HFA Inhale 2 puffs into the lungs every 4 (four) hours as needed for wheezing.   losartan 100 MG tablet Commonly known as:  COZAAR Take 1 tablet (100 mg total) by mouth daily.   metFORMIN 500 MG tablet Commonly known as:  GLUCOPHAGE Take 1 tablet (500 mg total) by mouth 2 (two) times daily with a meal.   omeprazole 40 MG capsule Commonly known as:  PRILOSEC Take 1  capsule (40 mg total) by mouth daily as needed (For heartburn or acid reflux.).   Ventolin HFA 108 (90 Base) MCG/ACT inhaler Generic drug:  albuterol Inhale 1 puff into the lungs every 4 (four) hours as needed for wheezing or shortness of breath.   vitamin B-12 1000 MCG tablet  Commonly known as:  CYANOCOBALAMIN Take 1,000 mcg by mouth daily.           Objective:   Physical Exam Skin:        BP 126/72 (BP Location: Left Arm, Patient Position: Sitting, Cuff Size: Normal)   Pulse 77   Temp 98.4 F (36.9 C) (Oral)   Resp 16   Ht 5\' 3"  (1.6 m)   Wt (!) 318 lb (144.2 kg)   LMP 09/15/2016   SpO2 97%   BMI 56.33 kg/m  General:   Well developed, NAD, BMI noted. HEENT:  Normocephalic . Face symmetric, atraumatic Neurologic:  alert & oriented X3.  Speech normal, gait appropriate for age and unassisted Psych--  Cognition and judgment appear intact.  Cooperative with normal attention span and concentration.  Behavior appropriate. No anxious or depressed appearing.      Assessment     Assessment  DM HTN Asthma Allergic rhinitis- sees allergist  Anxiety, depression Morbid obesity +pre-op OSA screen 2017, (-) sx 06/2017 Dyspepsia (prn omeprazole) L calf larger : [place] 02-2016 (-)  Plan  Skin mass: Suspect a sebaceous cyst versus dermatofibroma.  Appears benign.  We agreed on observation, will reassess the area when she comes back seen 2 months for a physical. If the area grows, hurts, bleeds, the skin changes in color or looks irritated she is to let me know HTN: Good compliance with medication.  No change RTC 07-2019 CPX

## 2019-05-13 NOTE — Telephone Encounter (Signed)
Copied from [address] [phone]. Topic: General - Other >> May 13, 2019 10:54 AM Carolyn Stare wrote:  Pt has hard knot in right leg and would like an appt, no one answered phone

## 2019-05-14 NOTE — Assessment & Plan Note (Signed)
Skin mass: Suspect a sebaceous cyst versus dermatofibroma.  Appears benign.  We agreed on observation, will reassess the area when she comes back seen 2 months for a physical. If the area grows, hurts, bleeds, the skin changes in color or looks irritated she is to let me know HTN: Good compliance with medication.  No change RTC 07-2019 CPX

## 2019-06-03 ENCOUNTER — Encounter

## 2019-06-03 ENCOUNTER — Ambulatory Visit: Payer: BLUE CROSS/BLUE SHIELD | Admitting: Obstetrics and Gynecology

## 2019-06-25 ENCOUNTER — Ambulatory Visit: Payer: BC Managed Care – PPO | Admitting: Internal Medicine

## 2019-06-25 ENCOUNTER — Other Ambulatory Visit: Payer: Self-pay

## 2019-06-26 ENCOUNTER — Other Ambulatory Visit: Payer: Self-pay | Admitting: *Deleted

## 2019-06-26 DIAGNOSIS — Z20822 Contact with and (suspected) exposure to covid-19: Secondary | ICD-10-CM

## 2019-07-01 LAB — NOVEL CORONAVIRUS, NAA: SARS-CoV-2, NAA: NOT DETECTED

## 2019-07-24 ENCOUNTER — Ambulatory Visit (INDEPENDENT_AMBULATORY_CARE_PROVIDER_SITE_OTHER): Payer: BC Managed Care – PPO | Admitting: Internal Medicine

## 2019-07-24 ENCOUNTER — Encounter: Payer: Self-pay | Admitting: Internal Medicine

## 2019-07-24 ENCOUNTER — Other Ambulatory Visit: Payer: Self-pay

## 2019-07-24 VITALS — BP 146/87 | HR 76 | Temp 98.1°F | Resp 16 | Ht 63.0 in | Wt 316.0 lb

## 2019-07-24 DIAGNOSIS — M25562 Pain in left knee: Secondary | ICD-10-CM | POA: Diagnosis not present

## 2019-07-24 DIAGNOSIS — E119 Type 2 diabetes mellitus without complications: Secondary | ICD-10-CM | POA: Diagnosis not present

## 2019-07-24 DIAGNOSIS — Z0000 Encounter for general adult medical examination without abnormal findings: Secondary | ICD-10-CM

## 2019-07-24 DIAGNOSIS — Z0001 Encounter for general adult medical examination with abnormal findings: Secondary | ICD-10-CM | POA: Diagnosis not present

## 2019-07-24 DIAGNOSIS — E785 Hyperlipidemia, unspecified: Secondary | ICD-10-CM

## 2019-07-24 DIAGNOSIS — L989 Disorder of the skin and subcutaneous tissue, unspecified: Secondary | ICD-10-CM | POA: Diagnosis not present

## 2019-07-24 LAB — COMPREHENSIVE METABOLIC PANEL
ALT: 25 U/L (ref 0–35)
AST: 15 U/L (ref 0–37)
Albumin: 4.1 g/dL (ref 3.5–5.2)
Alkaline Phosphatase: 59 U/L (ref 39–117)
BUN: 16 mg/dL (ref 6–23)
CO2: 30 mEq/L (ref 19–32)
Calcium: 9.5 mg/dL (ref 8.4–10.5)
Chloride: 102 mEq/L (ref 96–112)
Creatinine, Ser: 0.55 mg/dL (ref 0.40–1.20)
GFR: 114.1 mL/min (ref >60.00)
Glucose, Bld: 124 mg/dL — ABNORMAL HIGH (ref 70–99)
Potassium: 3.8 mEq/L (ref 3.5–5.1)
Sodium: 140 mEq/L (ref 135–145)
Total Bilirubin: 0.4 mg/dL (ref 0.2–1.2)
Total Protein: 6.5 g/dL (ref 6.0–8.3)

## 2019-07-24 LAB — LIPID PANEL
Cholesterol: 192 mg/dL (ref 0–200)
HDL: 49.8 mg/dL (ref >39.00)
LDL Cholesterol: 124 mg/dL — ABNORMAL HIGH (ref 0–99)
NonHDL: 142.64
Total CHOL/HDL Ratio: 4
Triglycerides: 91 mg/dL (ref 0.0–149.0)
VLDL: 18.2 mg/dL (ref 0.0–40.0)

## 2019-07-24 LAB — HEMOGLOBIN A1C: Hgb A1c MFr Bld: 6.8 % — ABNORMAL HIGH (ref 4.6–6.5)

## 2019-07-24 MED ORDER — BLOOD GLUCOSE METER KIT: PACK | 0 refills | Status: DC

## 2019-07-24 MED ORDER — HYDROCHLOROTHIAZIDE 25 MG PO TABS: 25.0000 mg | ORAL_TABLET | Freq: Every day | ORAL | 2 refills | Status: DC

## 2019-07-24 MED ORDER — LOSARTAN POTASSIUM 100 MG PO TABS: 100.0000 mg | ORAL_TABLET | Freq: Every day | ORAL | 2 refills | Status: DC

## 2019-07-24 MED ORDER — OMEPRAZOLE 40 MG PO CPDR: 40.0000 mg | DELAYED_RELEASE_CAPSULE | Freq: Every day | ORAL | 3 refills | Status: DC | PRN

## 2019-07-24 NOTE — Progress Notes (Signed)
Subjective:    Patient ID: Rachel Norris, female    DOB: 1961-05-15, 58 y.o.   MRN: [id number]  DOS:  07/24/2019 Type of visit - description: here for a CPX Other issues were discussed Skin lesion, right leg unchanged Has developed on and off left knee pain Ambulatory BPs normal Occasionally has palpitations without any other associated symptoms, sx started after she  transition from decaffeinated to caffeinated beverages.  Wt Readings from Last 3 Encounters:  07/24/19 (!) 316 lb (143.3 kg)  05/13/19 (!) 318 lb (144.2 kg)  01/30/19 (!) 312 lb 6 oz (141.7 kg)    BP Readings from Last 3 Encounters:  07/24/19 (!) 146/87  05/13/19 126/72  01/30/19 132/70      Review of Systems   Other than above, a 14 point review of systems is negative    Past Medical History:  Diagnosis Date  . Allergic rhinitis   . Ankle swelling   . Anxiety   . Anxiety and depression   . Arthritis    generalized arthritis -knees, feet.back.   . 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 At risk for sleep apnea    STOP-BANG= 5            SENT TO PCP 04-05-2015  . Bronchitis    Bronchitis a few months ago- no issues now. - no Inhalers used daily.  Tennis Must Quervain's tenosynovitis, right   . Depression   . Diabetes mellitus without complication ([place])    "pre diabetes"  . Fibroid   . GERD (gastroesophageal reflux disease)   . Heart palpitations   . Hormone disorder   . Hypertension   . Irregular menstrual bleeding   . Labial cyst    bilateral icclusion  . Perimenopausal   . PONV (postoperative nausea and vomiting)   . Pre-diabetes   . Prediabetes   . Shortness of breath dyspnea    with exertion, climbing a flight of stairs  . Thickened endometrium   . Wears glasses     Past Surgical History:  Procedure Laterality Date  . COLONOSCOPY WITH PROPOFOL N/A 05/15/2016   Procedure: COLONOSCOPY WITH PROPOFOL;  Surgeon: Mauri Pole, MD;  Location: [facility];  Service:  Endoscopy;  Laterality: N/A;  . DILATATION & CURETTAGE/HYSTEROSCOPY WITH MYOSURE N/A 08/30/2016   Procedure: DILATATION & CURETTAGE/HYSTEROSCOPY WITH MYOSURE;  Surgeon: Salvadore Dom, MD;  Location: [facility];  Service: Gynecology;  Laterality: N/A;  hysteroscopic myomectomy. BMI 53.8  . DILATION AND CURETTAGE OF UTERUS    . EAR CYST EXCISION Bilateral 04/08/2015   Procedure: removal of bilateral labial inclusion cysts;  Surgeon: Linda Hedges, DO;  Location: [facility];  Service: Gynecology;  Laterality: Bilateral;  . HYSTEROSCOPY    . HYSTEROSCOPY W/D&C N/A 04/08/2015   Procedure: DILATATION AND CURETTAGE /HYSTEROSCOPY, removal of bilateral labial inclusion cysts;  Surgeon: Linda Hedges, DO;  Location: [facility];  Service: Gynecology;  Laterality: N/A;  . LASER ABLATION OF THE CERVIX  1992   DYSPLAGIA  . MYOMECTOMY N/A 08/30/2016   Procedure: HYSTEROSCOPIC MYOMECTOMY;  Surgeon: Salvadore Dom, MD;  Location: [facility];  Service: Gynecology;  Laterality: N/A;  . 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 Marital status: Married    Spouse name: Not on file  . Number of children:  0  . Years of education: Not on file  . Highest education level: Not on file  Occupational History  . 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 Financial resource strain: Not on file  . Food insecurity    Worry: Not on file    Inability: Not on file  . Transportation needs    Medical: Not on file    Non-medical: Not on file  Tobacco Use  . Smoking status: Former Smoker    Packs/day: 1.00    Years: 20.00    Pack years: 20.00    Types: Cigarettes    Quit date: 04/04/1996    Years since quitting: 23.3  . Smokeless tobacco: Never Used  Substance and Sexual Activity  . Alcohol  use: Yes    Alcohol/week: 0.0 standard drinks    Comment: rare 6 per year  . Drug use: No  . Sexual activity: Not Currently    Partners: Male  Lifestyle  . Physical activity    Days per week: Not on file    Minutes per session: Not on file  . Stress: Not on file  Relationships  . Social Herbalist on phone: Not on file    Gets together: Not on file    Attends religious service: Not on file    Active member of club or organization: Not on file    Attends meetings of clubs or organizations: Not on file    Relationship status: Not on file  . Intimate partner violence    Fear of current or ex partner: Not on file    Emotionally abused: Not on file    Physically abused: Not on file    Forced sexual activity: Not on file  Other Topics Concern  . Not on file  Social History Narrative   Lives w/ husband         Family History  Problem Relation Age of Onset  . Diabetes Mother        M anmd others  . Dementia Mother   . Stroke Mother   . Stroke Father   . AAA (abdominal aortic aneurysm) Father   . Atrial fibrillation Sister   . Asthma Sister   . Allergic rhinitis Sister   . Bronchitis Sister   . Angioedema Sister   . Allergic rhinitis Brother   . Asthma Other   . Allergic rhinitis Sister   . Bronchitis Sister   . Colon cancer Neg Hx   . Breast cancer Neg Hx   . Coronary artery disease Neg Hx   . Eczema Neg Hx   . Urticaria Neg Hx   . Immunodeficiency Neg Hx      Allergies as of 07/24/2019      Reactions   Sulfa Antibiotics Hives      Medication List       Accurate as of July 24, 2019 11:59 PM. If you have any questions, ask your nurse or doctor.        accu-chek soft touch lancets CHECK BLOOD SUGAR ONCE DAILY   acetaminophen 500 MG tablet Commonly known as: TYLENOL Take 1,000 mg by mouth every 6 (six) hours as needed (For pain.).   Aleve 220 MG tablet Generic drug: naproxen sodium Take 440 mg by mouth every 8 (eight) hours as needed (For  pain.).   blood glucose meter kit and supplies Dispense based on patient and insurance preference.Check blood sugar  twice daily. Dx: E11.9 Started by: Kathlene November, MD   budesonide-formoterol 160-4.5 MCG/ACT inhaler Commonly known as: Symbicort Inhale 2 puffs into the lungs 2 (two) times daily. Use with spacer device.   fluticasone 50 MCG/ACT nasal spray Commonly known as: FLONASE Place 2 sprays into both nostrils daily.   glucose blood test strip Commonly known as: Accu-Chek [name] CHECK BLOOD SUGAR ONCE DAILY   hydrochlorothiazide 25 MG tablet Commonly known as: HYDRODIURIL Take 1 tablet (25 mg total) by mouth daily.   levalbuterol 45 MCG/ACT inhaler Commonly known as: Xopenex HFA Inhale 2 puffs into the lungs every 4 (four) hours as needed for wheezing.   losartan 100 MG tablet Commonly known as: COZAAR Take 1 tablet (100 mg total) by mouth daily.   metFORMIN 500 MG tablet Commonly known as: GLUCOPHAGE Take 1 tablet (500 mg total) by mouth 2 (two) times daily with a meal.   omeprazole 40 MG capsule Commonly known as: PRILOSEC Take 1 capsule (40 mg total) by mouth daily as needed (For heartburn or acid reflux.).   Ventolin HFA 108 (90 Base) MCG/ACT inhaler Generic drug: albuterol Inhale 1 puff into the lungs every 4 (four) hours as needed for wheezing or shortness of breath.   vitamin B-12 1000 MCG tablet Commonly known as: CYANOCOBALAMIN Take 1,000 mcg by mouth daily.           Objective:   Physical Exam BP (!) 146/87 (BP Location: Left Wrist, Patient Position: Sitting, Cuff Size: Small)   Pulse 76   Temp 98.1 F (36.7 C) (Oral)   Resp 16   Ht _0  (1.6 m)   Wt (!) 316 lb (143.3 kg)   LMP 09/15/2016   SpO2 96%   BMI 55.98 kg/m  General: Well developed, NAD, BMI noted Neck: No  thyromegaly  HEENT:  Normocephalic . Face symmetric, atraumatic Lungs:  CTA B Normal respiratory effort, no intercostal retractions, no accessory muscle use. Heart: RRR,   no murmur.  No pretibial edema bilaterally  Abdomen:  Not distended, soft, non-tender. No rebound or rigidity.   Skin: Exposed areas without rash. Not pale. Not jaundice DM foot exam: No edema, good pedal pulses: Pinprick examination normal Neurologic:  alert & oriented X3.  Speech normal, gait appropriate for age and unassisted Strength symmetric and appropriate for age.  Psych: Cognition and judgment appear intact.  Cooperative with normal attention span and concentration.  Behavior appropriate. No anxious or depressed appearing.     Assessment     Assessment  DM HTN Asthma Allergic rhinitis- sees allergist  Anxiety, depression Morbid obesity +pre-op OSA screen 2017, (-) sx 06/2017 Dyspepsia (prn omeprazole) L calf larger : [place] 02-2016 (-)  Plan  DM: Continue metformin,Check a A1c.  Feet exam normal.  Prescription for new glucometer sent, CBG goals discussed HTN: On losartan, BP slightly elevated, normal BPs at home.  No change Morbid obesity: Extensive discussion ref diet, strongly encouraged to see a health clinic & wellness center. Left knee pain: Refer to Ortho Skin mass: Unchanged, possibly a dermatofibroma, refer to dermatology for confirmation. Refill all medicines 90 days RTC 6 months Today, in addition to her physical exam I spent 15 minutes counseling regards chronic medical problems including DM, HTN, morbid obesity skin mass.

## 2019-07-24 NOTE — Assessment & Plan Note (Addendum)
Tdap @ work 2012 per pt  -pnm [id number] -prevnar:  2016 -shingrix : will discuss at the next opportunity -Flu shot at work -Female care per gynecology, next visit 08/2019;   MMG 01/2019 - DEXA? Menopausal sx onset ~ age 58, consider dexa at age 24.  rec Vit D OTC - CCS: Cscope 2017, next per GI  -Labs: CMP, FLP, A1c -Extensive discussion regards diet.

## 2019-07-24 NOTE — Patient Instructions (Addendum)
GO TO THE LAB : Get the blood work     GO TO THE FRONT DESK Schedule your next appointment   For a check up in 6 months    Check the  blood pressure 2 or 3 times a month   BP GOAL is between 110/65 and  135/85. If it is consistently higher or lower, let me know   Diabetes: Check your blood sugar  once or twice a  day     Check your blood sugar  at different times of the day GOALS: Fasting before a meal 70- 130 2 hours after a meal less than 180

## 2019-07-24 NOTE — Progress Notes (Signed)
Pre visit review using our clinic review tool, if applicable. No additional management support is needed unless otherwise documented below in the visit note. 

## 2019-07-26 NOTE — Assessment & Plan Note (Signed)
DM: Continue metformin,Check a A1c.  Feet exam normal.  Prescription for new glucometer sent, CBG goals discussed HTN: On losartan, BP slightly elevated, normal BPs at home.  No change Morbid obesity: Extensive discussion ref diet, strongly encouraged to see a health clinic & wellness center. Left knee pain: Refer to Ortho Skin mass: Unchanged, possibly a dermatofibroma, refer to dermatology for confirmation. Refill all medicines 90 days RTC 6 months

## 2019-07-27 MED ORDER — ATORVASTATIN CALCIUM 20 MG PO TABS: 20.0000 mg | ORAL_TABLET | Freq: Every day | ORAL | 2 refills | Status: DC

## 2019-07-27 NOTE — Addendum Note (Signed)
Addended byDamita Dunnings D on: 07/27/2019 01:12 PM   Modules accepted: Orders

## 2019-07-31 ENCOUNTER — Other Ambulatory Visit: Payer: Self-pay | Admitting: Internal Medicine

## 2019-08-18 ENCOUNTER — Other Ambulatory Visit: Payer: Self-pay

## 2019-08-19 NOTE — Progress Notes (Signed)
58 y.o. G0P0000 Married White or Caucasian Not Hispanic or Latino female here for annual exam.   She was seen last year with AUB, negative sonohysterogram, endometrial biopsy showed proliferative endometrium, FSH was 52.3. Cyclic provera was prescribed. She took the cyclic provera just one time, had some light bleeding. No bleeding for over a year.  Not sexually active currently, good marriage. Thinks they might become sexually active again if she looses weight.   She is setting up an appointment at the weight loss clinic. Trying to eat healthier. She has a left knee injury, got a steroid injection 2 weeks ago. Will get bilateral knee MRI's.   She has prediabetes    Patient's last menstrual period was 09/15/2016.          Sexually active: No.  The current method of family planning is post menopausal status.    Exercising: No.  The patient does not participate in regular exercise at present. Smoker:  no  Health Maintenance: Pap:  03-26-16 WNL NEG HR HPV  History of abnormal Pap:  Yes in the 1990s- had BX MMG:  02/06/2019 [place] 1 negative Colonoscopy:  05-15-16 WNL  BMD:   Never TDaP:  2012 Gardasil: N/A    reports that she quit smoking about 23 years ago. Her smoking use included cigarettes. She has a 20.00 pack-year smoking history. She has never used smokeless tobacco. She reports current alcohol use. She reports that she does not use drugs. 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 Husband is retired.   Past Medical History:  Diagnosis Date  . Allergic rhinitis   . Ankle swelling   . Anxiety   . Anxiety and depression   . Arthritis    generalized arthritis -knees, feet.back.   . 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 At risk for sleep apnea    STOP-BANG= 5            SENT TO PCP 04-05-2015  . Bronchitis    Bronchitis a few months ago- no issues now. - no Inhalers used daily.  Tennis Must Quervain's tenosynovitis, right   . Depression   . Diabetes mellitus without  complication ([place])    "pre diabetes"  . Fibroid   . GERD (gastroesophageal reflux disease)   . Heart palpitations   . Hormone disorder   . Hypertension   . Irregular menstrual bleeding   . Labial cyst    bilateral icclusion  . Perimenopausal   . PONV (postoperative nausea and vomiting)   . Pre-diabetes   . Prediabetes   . Shortness of breath dyspnea    with exertion, climbing a flight of stairs  . Thickened endometrium   . Wears glasses     Past Surgical History:  Procedure Laterality Date  . COLONOSCOPY WITH PROPOFOL N/A 05/15/2016   Procedure: COLONOSCOPY WITH PROPOFOL;  Surgeon: Mauri Pole, MD;  Location: [facility];  Service: Endoscopy;  Laterality: N/A;  . DILATATION & CURETTAGE/HYSTEROSCOPY WITH MYOSURE N/A 08/30/2016   Procedure: DILATATION & CURETTAGE/HYSTEROSCOPY WITH MYOSURE;  Surgeon: Salvadore Dom, MD;  Location: [facility];  Service: Gynecology;  Laterality: N/A;  hysteroscopic myomectomy. BMI 53.8  . DILATION AND CURETTAGE OF UTERUS    . EAR CYST EXCISION Bilateral 04/08/2015   Procedure: removal of bilateral labial inclusion cysts;  Surgeon: Linda Hedges, DO;  Location: [facility];  Service: Gynecology;  Laterality: Bilateral;  . HYSTEROSCOPY    . HYSTEROSCOPY W/D&C N/A  04/08/2015   Procedure: DILATATION AND CURETTAGE /HYSTEROSCOPY, removal of bilateral labial inclusion cysts;  Surgeon: Linda Hedges, DO;  Location: [facility];  Service: Gynecology;  Laterality: N/A;  . LASER ABLATION OF THE CERVIX  1992   DYSPLAGIA  . MYOMECTOMY N/A 08/30/2016   Procedure: HYSTEROSCOPIC MYOMECTOMY;  Surgeon: Salvadore Dom, MD;  Location: [facility];  Service: Gynecology;  Laterality: N/A;  . 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 acetaminophen (TYLENOL) 500 MG  tablet Take 1,000 mg by mouth every 6 (six) hours as needed (For pain.).     Marland Kitchen albuterol (VENTOLIN HFA) 108 (90 Base) MCG/ACT inhaler Inhale 1 puff into the lungs every 4 (four) hours as needed for wheezing or shortness of breath.     . blood glucose meter kit and supplies Dispense based on patient and insurance preference.Check blood sugar twice daily. Dx: E11.9 1 each 0  . budesonide-formoterol (SYMBICORT) 160-4.5 MCG/ACT inhaler Inhale 2 puffs into the lungs 2 (two) times daily. Use with spacer device. 1 Inhaler 5  . fluticasone (FLONASE) 50 MCG/ACT nasal spray Place 2 sprays into both nostrils daily. 16 g 0  . glucose blood (ACCU-CHEK [name]) test strip CHECK BLOOD SUGAR ONCE DAILY 100 each 12  . hydrochlorothiazide (HYDRODIURIL) 25 MG tablet Take 1 tablet (25 mg total) by mouth daily. 90 tablet 2  . Lancets (ACCU-CHEK SOFT TOUCH) lancets CHECK BLOOD SUGAR ONCE DAILY 100 each 12  . levalbuterol (XOPENEX HFA) 45 MCG/ACT inhaler Inhale 2 puffs into the lungs every 4 (four) hours as needed for wheezing. 1 Inhaler 2  . losartan (COZAAR) 100 MG tablet Take 1 tablet (100 mg total) by mouth daily. 90 tablet 2  . metFORMIN (GLUCOPHAGE) 500 MG tablet Take 1 tablet (500 mg total) by mouth 2 (two) times daily with a meal. 180 tablet 2  . naproxen sodium (ALEVE) 220 MG tablet Take 440 mg by mouth every 8 (eight) hours as needed (For pain.).    Marland Kitchen omeprazole (PRILOSEC) 40 MG capsule Take 1 capsule (40 mg total) by mouth daily as needed (For heartburn or acid reflux.). 90 capsule 3   No current facility-administered medications for this visit.     Family History  Problem Relation Age of Onset  . Diabetes Mother        M anmd others  . Dementia Mother   . Stroke Mother   . Stroke Father   . AAA (abdominal aortic aneurysm) Father   . Atrial fibrillation Sister   . Asthma Sister   . Allergic rhinitis Sister   . Bronchitis Sister   . Angioedema Sister   . Allergic rhinitis Brother   . Asthma Other    . Allergic rhinitis Sister   . Bronchitis Sister   . Colon cancer Neg Hx   . Breast cancer Neg Hx   . Coronary artery disease Neg Hx   . Eczema Neg Hx   . Urticaria Neg Hx   . Immunodeficiency Neg Hx     Review of Systems  Constitutional: Negative.   HENT: Negative.   Eyes: Negative.   Respiratory: Negative.   Cardiovascular: Negative.   Gastrointestinal: Negative.   Endocrine: Negative.   Genitourinary:       Vaginal itching Intermittent incontinence  Skin: Negative.   Allergic/Immunologic: Negative.   Neurological: Negative.  Hematological: Negative.   Psychiatric/Behavioral: Negative.     Exam:   BP (!) 142/82 (BP Location: Right Arm, Patient Position: Sitting, Cuff Size: Large)   Pulse 76   Temp 98.2 F (36.8 C) (Skin)   Ht 5' 2.5" (1.588 m)   Wt (!) 315 lb (142.9 kg)   LMP 09/15/2016   BMI 56.70 kg/m   Weight change: @WEIGHTCHANGE @ Height:   Height: 5' 2.5" (158.8 cm)  Ht Readings from Last 3 Encounters:  08/20/19 5' 2.5" (1.588 m)  07/24/19 5' 3"  (1.6 m)  05/13/19 5' 3"  (1.6 m)    General appearance: alert, cooperative and appears stated age Head: Normocephalic, without obvious abnormality, atraumatic Neck: no adenopathy, supple, symmetrical, trachea midline and thyroid normal to inspection and palpation Lungs: clear to auscultation bilaterally Cardiovascular: regular rate and rhythm Breasts: normal appearance, no masses or tenderness Abdomen: soft, non-tender; non distended,  no masses,  no organomegaly Extremities: extremities normal, atraumatic, no cyanosis or edema Skin: Skin color, texture, turgor normal. No rashes or lesions Lymph nodes: Cervical, supraclavicular, and axillary nodes normal. No abnormal inguinal nodes palpated Neurologic: Grossly normal   Pelvic: External genitalia:  no lesions              Urethra:  normal appearing urethra with no masses, tenderness or lesions              Bartholins and Skenes: normal                  Vagina: normal appearing vagina with normal color and discharge, no lesions              Cervix: no lesions               Bimanual Exam:  Uterus:  limited by BMI, no masses or tenderness              Adnexa: no mass, fullness, tenderness               Rectovaginal: Confirms               Anus:  normal sphincter tone, no lesions  Chaperone was present for exam.  A:  Well Woman with normal exam  BMI 56  Prediabetes, hypertension  P:   No pap this year  Mammogram and colonoscopy UTD  Labs with primary  She is joining a weight loss clinic  Discussed breast self exam  Discussed calcium and vit D intake

## 2019-08-20 ENCOUNTER — Encounter: Payer: Self-pay | Admitting: Obstetrics and Gynecology

## 2019-08-20 ENCOUNTER — Other Ambulatory Visit: Payer: Self-pay

## 2019-08-20 ENCOUNTER — Ambulatory Visit (INDEPENDENT_AMBULATORY_CARE_PROVIDER_SITE_OTHER): Payer: BC Managed Care – PPO | Admitting: Obstetrics and Gynecology

## 2019-08-20 VITALS — BP 142/82 | HR 76 | Temp 98.2°F | Ht 62.5 in | Wt 315.0 lb

## 2019-08-20 DIAGNOSIS — Z8639 Personal history of other endocrine, nutritional and metabolic disease: Secondary | ICD-10-CM

## 2019-08-20 DIAGNOSIS — I10 Essential (primary) hypertension: Secondary | ICD-10-CM | POA: Diagnosis not present

## 2019-08-20 DIAGNOSIS — Z01419 Encounter for gynecological examination (general) (routine) without abnormal findings: Secondary | ICD-10-CM

## 2019-08-20 DIAGNOSIS — Z6841 Body Mass Index (BMI) 40.0 and over, adult: Secondary | ICD-10-CM

## 2019-08-20 NOTE — Patient Instructions (Signed)

## 2019-09-06 ENCOUNTER — Other Ambulatory Visit: Payer: Self-pay | Admitting: Internal Medicine

## 2019-09-23 ENCOUNTER — Other Ambulatory Visit: Payer: Self-pay | Admitting: Internal Medicine

## 2019-10-12 ENCOUNTER — Other Ambulatory Visit: Payer: Self-pay | Admitting: Orthopedic Surgery

## 2019-10-13 ENCOUNTER — Other Ambulatory Visit: Payer: Self-pay | Admitting: Orthopedic Surgery

## 2019-10-14 ENCOUNTER — Other Ambulatory Visit: Payer: Self-pay | Admitting: Orthopedic Surgery

## 2019-10-23 ENCOUNTER — Other Ambulatory Visit: Payer: Self-pay | Admitting: Internal Medicine

## 2019-11-05 ENCOUNTER — Encounter (HOSPITAL_COMMUNITY)
Admission: RE | Admit: 2019-11-05 | Discharge: 2019-11-05 | Disposition: A | Discharge status: Home / Self Care | Payer: BC Managed Care – PPO | Source: Ambulatory Visit | Attending: Orthopedic Surgery | Admitting: Orthopedic Surgery

## 2019-11-05 ENCOUNTER — Other Ambulatory Visit (HOSPITAL_COMMUNITY)
Admission: RE | Admit: 2019-11-05 | Discharge: 2019-11-05 | Disposition: A | Discharge status: Home / Self Care | Payer: BC Managed Care – PPO | Source: Ambulatory Visit | Attending: Orthopedic Surgery | Admitting: Orthopedic Surgery

## 2019-11-05 ENCOUNTER — Encounter (HOSPITAL_COMMUNITY): Payer: Self-pay

## 2019-11-05 ENCOUNTER — Other Ambulatory Visit: Payer: Self-pay

## 2019-11-05 DIAGNOSIS — Z01818 Encounter for other preprocedural examination: Secondary | ICD-10-CM | POA: Insufficient documentation

## 2019-11-05 DIAGNOSIS — I10 Essential (primary) hypertension: Secondary | ICD-10-CM | POA: Insufficient documentation

## 2019-11-05 DIAGNOSIS — Z20828 Contact with and (suspected) exposure to other viral communicable diseases: Secondary | ICD-10-CM | POA: Insufficient documentation

## 2019-11-05 LAB — GLUCOSE, CAPILLARY: Glucose-Capillary: 113 mg/dL — ABNORMAL HIGH (ref 70–99)

## 2019-11-05 LAB — CBC
HCT: 42.5 % (ref 36.0–46.0)
Hemoglobin: 14.1 g/dL (ref 12.0–15.0)
MCH: 32.3 pg (ref 26.0–34.0)
MCHC: 33.2 g/dL (ref 30.0–36.0)
MCV: 97.5 fL (ref 80.0–100.0)
Platelets: 365 10*3/uL (ref 150–400)
RBC: 4.36 MIL/uL (ref 3.87–5.11)
RDW: 12.5 % (ref 11.5–15.5)
WBC: 8.9 10*3/uL (ref 4.0–10.5)
nRBC: 0 % (ref 0.0–0.2)

## 2019-11-05 LAB — BASIC METABOLIC PANEL
Anion gap: 14 (ref 5–15)
BUN: 16 mg/dL (ref 6–20)
CO2: 23 mmol/L (ref 22–32)
Calcium: 9.4 mg/dL (ref 8.9–10.3)
Chloride: 101 mmol/L (ref 98–111)
Creatinine, Ser: 0.61 mg/dL (ref 0.44–1.00)
GFR calc Af Amer: >60 mL/min (ref >60)
GFR calc non Af Amer: >60 mL/min (ref >60)
Glucose, Bld: 110 mg/dL — ABNORMAL HIGH (ref 70–99)
Potassium: 4.3 mmol/L (ref 3.5–5.1)
Sodium: 138 mmol/L (ref 135–145)

## 2019-11-05 LAB — HEMOGLOBIN A1C
Hgb A1c MFr Bld: 6.9 % — ABNORMAL HIGH (ref 4.8–5.6)
Mean Plasma Glucose: 151.33 mg/dL

## 2019-11-05 NOTE — Progress Notes (Signed)
PCP - Dr. Larose Kells Cardiologist - denies  PPM/ICD - denies Device Orders -  Rep Notified -   Chest x-ray - N/A EKG - 11/05/19 Stress Test - denies ECHO - denies Cardiac Cath - denies  Sleep Study - denies CPAP -   Fasting Blood Sugar - 130-140 Checks Blood Sugar ___1__ times a day  Blood Thinner Instructions: N/A Aspirin Instructions: N/A  ERAS Protcol - yes PRE-SURGERY Ensure or G2- G2  COVID TEST- [id number]  Visitor policy reviewed with pt and she voiced understanding.   Pt states she has an itchy rash on her upper chest. States she is planning to try some corticosteroid cream. Instructed pt to notify Dr. Berenice Primas' office if rash worsens and to see her PCP if needed.  Anesthesia review: yes -EKG  Patient denies shortness of breath, fever, cough and chest pain at PAT appointment   All instructions explained to the patient, with a verbal understanding of the material. Patient agrees to go over the instructions while at home for a better understanding. Patient also instructed to self quarantine after being tested for COVID-19. The opportunity to ask questions was provided.

## 2019-11-05 NOTE — Pre-Procedure Instructions (Signed)
Rachel Norris  11/05/2019    Your procedure is scheduled on Monday, November 09, 2019 at 12:30 PM.   Report to [facility] Entrance "A" Admitting Office at 10:30 AM.   Call this number if you have problems the morning of surgery:  [phone]   Questions prior to day of surgery, please call [phone] between 8 & 4 PM.   Remember:  Do not eat food after midnight Sunday, 11/08/19.  You may drink clear liquids until 9:30 AM .  Clear liquids allowed are: Water, Juice (non-citric and without pulp), Carbonated beverages, Clear Tea, Black Coffee only, Plain Jell-O only, Gatorade and Plain Popsicles only   Drink the Gatorade G2 by 9:30 AM. Do not sip it.    Take these medicines the morning of surgery with A SIP OF WATER: Omeprazole (Prilosec) - if needed, Fexofenadine (Allegra) - if needed, Tylenol - if needed, Symbicort inhaler - if needed, Albuterol inhaler - if needed (bring this inhaler with you day of surgery) eye drops - if needed, Flonase - if needed  Stop NSAIDS (Ibuprofen, Naprosyn, Aleve, etc) and Herbal medications prior to surgery. Do not use Aspirin products, Multivitamins or Fish Oil prior to surgery    How to Manage Your Diabetes Before Surgery   Why is it important to control my blood sugar before and after surgery?   Improving blood sugar levels before and after surgery helps healing and can limit problems.  A way of improving blood sugar control is eating a healthy diet by:  - Eating less sugar and carbohydrates  - Increasing activity/exercise  - Talk with your doctor about reaching your blood sugar goals  High blood sugars (greater than 180 mg/dL) can raise your risk of infections and slow down your recovery so you will need to focus on controlling your diabetes during the weeks before surgery.  Make sure that the doctor who takes care of your diabetes knows about your planned surgery including the date and location.  How do I manage my blood sugars  before surgery?   Check your blood sugar at least 4 times a day, 2 days before surgery to make sure that they are not too high or low.  Check your blood sugar the morning of your surgery when you wake up and every 2 hours until you get to the Short-Stay unit.  Treat a low blood sugar (less than 70 mg/dL) with 1/2 cup of clear juice (cranberry or apple), 4 glucose tablets, OR glucose gel.  Recheck blood sugar in 15 minutes after treatment (to make sure it is greater than 70 mg/dL).  If blood sugar is not greater than 70 mg/dL on re-check, call [phone] for further instructions.   Report your blood sugar to the Short-Stay nurse when you get to Short-Stay.  References:  [facility], 2007 "How to Manage your Diabetes Before and After Surgery".    Do not wear jewelry, make-up or nail polish.  Do not wear lotions, powders, perfumes or deodorant.  Do not shave 48 hours prior to surgery.    Do not bring valuables to the hospital.  [facility] is not responsible for any belongings or valuables.  Contacts, dentures or bridgework may not be worn into surgery.  Leave your suitcase in the car.  After surgery it may be brought to your room.  For patients admitted to the hospital, discharge time will be determined by your treatment team.  Patients discharged the day of surgery will  not be allowed to drive home.   [facility] [place] - Preparing for Surgery  Before surgery, you can play an important role.  Because skin is not sterile, your skin needs to be as free of germs as possible.  You can reduce the number of germs on you skin by washing with CHG (chlorahexidine gluconate) soap before surgery.  CHG is an antiseptic cleaner which kills germs and bonds with the skin to continue killing germs even after washing.  Oral Hygiene is also important in reducing the risk of infection.  Remember to brush your teeth with your regular toothpaste the morning of surgery.  Please DO NOT  use if you have an allergy to CHG or antibacterial soaps.  If your skin becomes reddened/irritated stop using the CHG and inform your nurse when you arrive at Short Stay.  Do not shave (including legs and underarms) for at least 48 hours prior to the first CHG shower.  You may shave your face.  Please follow these instructions carefully:   1.  Shower with CHG Soap the night before surgery and the morning of Surgery.  2.  If you choose to wash your hair, wash your hair first as usual with your normal shampoo.  3.  After you shampoo, rinse your hair and body thoroughly to remove the shampoo. 4.  Use CHG as you would any other liquid soap.  You can apply chg directly to the skin and wash gently with a      scrungie or washcloth.           5.  Apply the CHG Soap to your body ONLY FROM THE NECK DOWN.   Do not use on open wounds or open sores. Avoid contact with your eyes, ears, mouth and genitals (private parts).  Wash genitals (private parts) with your normal soap.  6.  Wash thoroughly, paying special attention to the area where your surgery will be performed.  7.  Thoroughly rinse your body with warm water from the neck down.  8.  DO NOT shower/wash with your normal soap after using and rinsing off the CHG Soap.  9.  Pat yourself dry with a clean towel.            10.  Wear clean pajamas.            11.  Place clean sheets on your bed the night of your first shower and do not sleep with pets.  Day of Surgery  Shower as above. Do not apply any lotions/deodorants the morning of surgery.   Please wear clean clothes to the hospital. Remember to brush your teeth with toothpaste.   Please read over the fact sheets that you were given.

## 2019-11-05 NOTE — Progress Notes (Signed)
   11/05/19 1458  OBSTRUCTIVE SLEEP APNEA  Have you ever been diagnosed with sleep apnea through a sleep study? No  Do you snore loudly (loud enough to be heard through closed doors)?  0  Do you often feel tired, fatigued, or sleepy during the daytime (such as falling asleep during driving or talking to someone)? 0  Has anyone observed you stop breathing during your sleep? 0  Do you have, or are you being treated for high blood pressure? 1  BMI more than 35 kg/m2? 1  Age > 50 (1-yes) 1  Neck circumference greater than:Female 16 inches or larger, Female 17inches or larger? 1  Female Gender (Yes=1) 0  Obstructive Sleep Apnea Score 4

## 2019-11-06 ENCOUNTER — Ambulatory Visit (INDEPENDENT_AMBULATORY_CARE_PROVIDER_SITE_OTHER): Payer: BC Managed Care – PPO | Admitting: Internal Medicine

## 2019-11-06 ENCOUNTER — Encounter: Payer: Self-pay | Admitting: Internal Medicine

## 2019-11-06 ENCOUNTER — Telehealth: Payer: Self-pay

## 2019-11-06 DIAGNOSIS — I10 Essential (primary) hypertension: Secondary | ICD-10-CM

## 2019-11-06 LAB — NOVEL CORONAVIRUS, NAA (HOSP ORDER, SEND-OUT TO REF LAB; TAT 18-24 HRS): SARS-CoV-2, NAA: NOT DETECTED

## 2019-11-06 MED ORDER — FELODIPINE ER 5 MG PO TB24: 5.0000 mg | ORAL_TABLET | Freq: Every day | ORAL | 3 refills | Status: DC

## 2019-11-06 MED ORDER — DEXTROSE 5 % IV SOLN
3.0000 g | INTRAVENOUS | Status: AC
  Administered 2019-11-09: 3 g via INTRAVENOUS
  Filled 2019-11-06: qty 3

## 2019-11-06 NOTE — Progress Notes (Signed)
Subjective:    Patient ID: Rachel Norris, female    DOB: 1961/09/10, 58 y.o.   MRN: [id number]  DOS:  11/06/2019 Type of visit - description: Attempted  to make this a video visit, due to technical difficulties from the patient side it was not possible  thus we proceeded with a Virtual Visit via Telephone    I connected with@   by telephone and verified that I am speaking with the correct person using two identifiers.  THIS ENCOUNTER IS A VIRTUAL VISIT DUE TO COVID-19 - PATIENT WAS NOT SEEN IN THE OFFICE. PATIENT HAS CONSENTED TO VIRTUAL VISIT / TELEMEDICINE VISIT   Location of patient: home  Location of provider: office  I discussed the limitations, risks, security and privacy concerns of performing an evaluation and management service by telephone and the availability of in person appointments. I also discussed with the patient that there may be a patient responsible charge related to this service. The patient expressed understanding and agreed to proceed.   History of Present Illness: Acute The patient went to her preop evaluation yesterday, BP was elevated around 160/72. Today she checked her blood pressure several times: 188/98, 175/85, 116/75, 160/83. Good compliance with medications. Denies symptoms consistent with elevated BP although from time to time her face get a little red. Admits to some inactivity lately due to retirement from her job and also because knee pain     Review of Systems Denies fever chills Asthma is well controlled No chest pain no difficulty breathing No headaches Past Medical History:  Diagnosis Date  . Allergic rhinitis   . Ankle swelling   . Anxiety   . Anxiety and depression   . Arthritis    generalized arthritis -knees, feet.back.   . 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 At risk for sleep apnea    STOP-BANG= 5            SENT TO PCP 04-05-2015  . Bronchitis    Bronchitis a few months ago- no issues now. - no Inhalers used  daily.  T[place] Must Quervain's tenosynovitis, right   . Depression   . Diabetes mellitus without complication ([place])    "pre diabetes"  . Fibroid   . GERD (gastroesophageal reflux disease)   . Heart palpitations   . Hormone disorder   . Hypertension   . Irregular menstrual bleeding   . Labial cyst    bilateral icclusion  . Perimenopausal   . PONV (postoperative nausea and vomiting)   . Pre-diabetes   . Prediabetes   . Shortness of breath dyspnea    with exertion, climbing a flight of stairs  . Thickened endometrium   . Wears glasses     Past Surgical History:  Procedure Laterality Date  . COLONOSCOPY WITH PROPOFOL N/A 05/15/2016   Procedure: COLONOSCOPY WITH PROPOFOL;  Surgeon: Mauri Pole, MD;  Location: [facility];  Service: Endoscopy;  Laterality: N/A;  . DILATATION & CURETTAGE/HYSTEROSCOPY WITH MYOSURE N/A 08/30/2016   Procedure: DILATATION & CURETTAGE/HYSTEROSCOPY WITH MYOSURE;  Surgeon: Salvadore Dom, MD;  Location: [facility];  Service: Gynecology;  Laterality: N/A;  hysteroscopic myomectomy. BMI 53.8  . DILATION AND CURETTAGE OF UTERUS    . EAR CYST EXCISION Bilateral 04/08/2015   Procedure: removal of bilateral labial inclusion cysts;  Surgeon: Linda Hedges, DO;  Location: [facility];  Service: Gynecology;  Laterality: Bilateral;  . HYSTEROSCOPY    . HYSTEROSCOPY W/D&C N/A 04/08/2015  Procedure: DILATATION AND CURETTAGE /HYSTEROSCOPY, removal of bilateral labial inclusion cysts;  Surgeon: Linda Hedges, DO;  Location: [facility];  Service: Gynecology;  Laterality: N/A;  . LASER ABLATION OF THE CERVIX  1992   DYSPLAGIA  . MYOMECTOMY N/A 08/30/2016   Procedure: HYSTEROSCOPIC MYOMECTOMY;  Surgeon: Salvadore Dom, MD;  Location: [facility];  Service: Gynecology;  Laterality: N/A;  . 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 Marital status: Married    Spouse name: Not on file  . Number of children: 0  . Years of education: Not on file  . Highest education level: Not on file  Occupational History  . 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 Financial resource strain: Not on file  . Food insecurity    Worry: Not on file    Inability: Not on file  . Transportation needs    Medical: Not on file    Non-medical: Not on file  Tobacco Use  . Smoking status: Former Smoker    Packs/day: 1.00    Years: 20.00    Pack years: 20.00    Types: Cigarettes    Quit date: 04/04/1996    Years since quitting: 23.6  . Smokeless tobacco: Never Used  Substance and Sexual Activity  . Alcohol use: Yes    Alcohol/week: 0.0 standard drinks    Comment: rarely  . Drug use: No  . Sexual activity: Not Currently    Partners: Male    Birth control/protection: Post-menopausal  Lifestyle  . Physical activity    Days per week: Not on file    Minutes per session: Not on file  . Stress: Not on file  Relationships  . Social Herbalist on phone: Not on file    Gets together: Not on file    Attends religious service: Not on file    Active member of club or organization: Not on file    Attends meetings of clubs or organizations: Not on file    Relationship status: Not on file  . Intimate partner violence    Fear of current or ex partner: Not on file    Emotionally abused: Not on file    Physically abused: Not on file    Forced sexual activity: Not on file  Other Topics Concern  . Not on file  Social History Narrative   Lives w/ husband          Allergies as of 11/06/2019      Reactions   Sulfa Antibiotics Hives      Medication List       Accurate as of November 06, 2019  2:30 PM. If you have any questions, ask your nurse or doctor.        Accu-Chek Guide test strip Generic drug: glucose blood USE TO CHECK BLOOD  SUGAR TWICE DAILY   accu-chek soft touch lancets CHECK BLOOD SUGAR ONCE DAILY   acetaminophen 650 MG CR tablet Commonly known as: TYLENOL Take 1,300 mg by mouth every 8 (eight) hours as needed for pain.   Aleve 220 MG tablet Generic drug: naproxen sodium Take 440 mg by mouth 2 (two) times daily as needed (For pain.).   BIOFREEZE EX Apply 1 application topically daily as needed (knee pain).  blood glucose meter kit and supplies Dispense based on patient and insurance preference.Check blood sugar twice daily. Dx: E11.9   budesonide-formoterol 160-4.5 MCG/ACT inhaler Commonly known as: Symbicort Inhale 2 puffs into the lungs 2 (two) times daily. Use with spacer device. What changed:   when to take this  reasons to take this   fexofenadine 180 MG tablet Commonly known as: ALLEGRA Take 180 mg by mouth daily as needed for allergies or rhinitis.   fluticasone 50 MCG/ACT nasal spray Commonly known as: FLONASE Place 2 sprays into both nostrils daily. What changed:   when to take this  reasons to take this   hydrochlorothiazide 25 MG tablet Commonly known as: HYDRODIURIL Take 1 tablet (25 mg total) by mouth daily.   hydrocortisone cream 1 % Apply 1 application topically 2 (two) times daily as needed (rash).   losartan 100 MG tablet Commonly known as: COZAAR Take 1 tablet (100 mg total) by mouth daily. What changed: when to take this   metFORMIN 500 MG tablet Commonly known as: GLUCOPHAGE Take 1 tablet (500 mg total) by mouth 2 (two) times daily with a meal.   neomycin-bacitracin-polymyxin ointment Commonly known as: NEOSPORIN Apply 1 application topically daily as needed for wound care.   omeprazole 40 MG capsule Commonly known as: PRILOSEC Take 1 capsule (40 mg total) by mouth daily as needed (For heartburn or acid reflux.).   Opcon-A 0.027-0.315 % Soln Generic drug: Naphazoline-Pheniramine Place 1 drop into both eyes daily as needed (allergies).   OVER  THE COUNTER MEDICATION Take 2 tablets by mouth 2 (two) times a week. Super beet chews   traMADol 50 MG tablet Commonly known as: ULTRAM Take by mouth every 6 (six) hours as needed.   Unisom Sleepgels 50 MG Caps Generic drug: diphenhydrAMINE HCl (Sleep) Take 50 mg by mouth at bedtime as needed (sleep).   Ventolin HFA 108 (90 Base) MCG/ACT inhaler Generic drug: albuterol Inhale 2 puffs into the lungs every 4 (four) hours as needed for wheezing or shortness of breath.           Objective:   Physical Exam LMP 09/15/2016  This is a virtual video visit.  Alert oriented x3, no apparent distress    Assessment     Assessment  DM HTN Asthma Allergic rhinitis- sees allergist  Anxiety, depression Morbid obesity +pre-op OSA screen 2017, (-) sx 06/2017 Dyspepsia (prn omeprazole) L calf larger : [place] 02-2016 (-)  PLAN HTN: Good compliance with losartan 100 mg daily, HCTZ.  BP elevated yesterday and today, see HPI.  otherwise no recent ambulatory BPs.  Good compliance to medication. Her BP may be elevated possibly due to pain and decreased physical activity. She will have a knee scope in 3 days. Plan: Add Plendil 5 mg, monitor BPs, watch for lower extremity edema. Call with readings in 2 weeks Follow-up already scheduled for January. + Stop bang screening presurgically: Reassess on RTC    I discussed the assessment and treatment plan with the patient. The patient was provided an opportunity to ask questions and all were answered. The patient agreed with the plan and demonstrated an understanding of the instructions.

## 2019-11-06 NOTE — Telephone Encounter (Signed)
Needs an appt w/ PCP or another provider if having BP issues.

## 2019-11-06 NOTE — Telephone Encounter (Signed)
Is okay, thank you

## 2019-11-06 NOTE — Telephone Encounter (Signed)
Copied from [place] [id number]. Topic: General - Other >> Nov 06, 2019  8:32 AM Pauline Good wrote: Reason for CRM: pt calling and stating she doesn't think her lorsartin is helping with her BP. Pt want call back from nurse.

## 2019-11-06 NOTE — Telephone Encounter (Signed)
Appt scheduled w/ PCP today.

## 2019-11-06 NOTE — Telephone Encounter (Signed)
Copied from [place] [id number]. Topic: Appointment Scheduling - Transfer of Care >> Nov 06, 2019  3:18 PM Leward Quan A wrote: Pt is requesting to transfer FROM: [place] Pt is requesting to transfer TO: [place] Reason for requested transfer: Husband is a current patient   Send CRM to patient's current PCP (transferring FROM).

## 2019-11-07 NOTE — Assessment & Plan Note (Signed)
HTN: Good compliance with losartan 100 mg daily, HCTZ.  BP elevated yesterday and today, see HPI.  otherwise no recent ambulatory BPs.  Good compliance to medication. Her BP may be elevated possibly due to pain and decreased physical activity. She will have a knee scope in 3 days. Plan: Add Plendil 5 mg, monitor BPs, watch for lower extremity edema. Call with readings in 2 weeks Follow-up already scheduled for January. + Stop bang screening presurgically: Reassess on RTC

## 2019-11-08 NOTE — Telephone Encounter (Signed)
Ok with me 

## 2019-11-09 ENCOUNTER — Encounter (HOSPITAL_COMMUNITY)
Admission: RE | Disposition: A | Discharge status: Home / Self Care | Payer: Self-pay | Source: Home / Self Care | Attending: Orthopedic Surgery

## 2019-11-09 ENCOUNTER — Ambulatory Visit (HOSPITAL_COMMUNITY)
Admission: RE | Admit: 2019-11-09 | Discharge: 2019-11-09 | Disposition: A | Discharge status: Home / Self Care | Payer: BC Managed Care – PPO | Attending: Orthopedic Surgery | Admitting: Orthopedic Surgery

## 2019-11-09 ENCOUNTER — Other Ambulatory Visit: Payer: Self-pay

## 2019-11-09 ENCOUNTER — Ambulatory Visit (HOSPITAL_COMMUNITY): Payer: BC Managed Care – PPO | Admitting: Anesthesiology

## 2019-11-09 ENCOUNTER — Encounter (HOSPITAL_COMMUNITY): Payer: Self-pay

## 2019-11-09 DIAGNOSIS — Z7951 Long term (current) use of inhaled steroids: Secondary | ICD-10-CM | POA: Insufficient documentation

## 2019-11-09 DIAGNOSIS — Z882 Allergy status to sulfonamides status: Secondary | ICD-10-CM | POA: Insufficient documentation

## 2019-11-09 DIAGNOSIS — X58XXXA Exposure to other specified factors, initial encounter: Secondary | ICD-10-CM | POA: Diagnosis not present

## 2019-11-09 DIAGNOSIS — Z6841 Body Mass Index (BMI) 40.0 and over, adult: Secondary | ICD-10-CM | POA: Diagnosis not present

## 2019-11-09 DIAGNOSIS — M479 Spondylosis, unspecified: Secondary | ICD-10-CM | POA: Diagnosis not present

## 2019-11-09 DIAGNOSIS — Z8249 Family history of ischemic heart disease and other diseases of the circulatory system: Secondary | ICD-10-CM | POA: Insufficient documentation

## 2019-11-09 DIAGNOSIS — E119 Type 2 diabetes mellitus without complications: Secondary | ICD-10-CM | POA: Diagnosis not present

## 2019-11-09 DIAGNOSIS — S83242A Other tear of medial meniscus, current injury, left knee, initial encounter: Secondary | ICD-10-CM | POA: Insufficient documentation

## 2019-11-09 DIAGNOSIS — S83282A Other tear of lateral meniscus, current injury, left knee, initial encounter: Secondary | ICD-10-CM | POA: Diagnosis not present

## 2019-11-09 DIAGNOSIS — Z79899 Other long term (current) drug therapy: Secondary | ICD-10-CM | POA: Insufficient documentation

## 2019-11-09 DIAGNOSIS — J45909 Unspecified asthma, uncomplicated: Secondary | ICD-10-CM | POA: Insufficient documentation

## 2019-11-09 DIAGNOSIS — Z87891 Personal history of nicotine dependence: Secondary | ICD-10-CM | POA: Insufficient documentation

## 2019-11-09 DIAGNOSIS — Z7984 Long term (current) use of oral hypoglycemic drugs: Secondary | ICD-10-CM | POA: Diagnosis not present

## 2019-11-09 DIAGNOSIS — K219 Gastro-esophageal reflux disease without esophagitis: Secondary | ICD-10-CM | POA: Insufficient documentation

## 2019-11-09 DIAGNOSIS — M94262 Chondromalacia, left knee: Secondary | ICD-10-CM | POA: Diagnosis present

## 2019-11-09 DIAGNOSIS — M159 Polyosteoarthritis, unspecified: Secondary | ICD-10-CM | POA: Diagnosis not present

## 2019-11-09 DIAGNOSIS — I10 Essential (primary) hypertension: Secondary | ICD-10-CM | POA: Diagnosis not present

## 2019-11-09 DIAGNOSIS — M6752 Plica syndrome, left knee: Secondary | ICD-10-CM | POA: Diagnosis present

## 2019-11-09 HISTORY — PX: KNEE ARTHROSCOPY WITH MEDIAL MENISECTOMY: SHX5651

## 2019-11-09 LAB — GLUCOSE, CAPILLARY
Glucose-Capillary: 122 mg/dL — ABNORMAL HIGH (ref 70–99)
Glucose-Capillary: 140 mg/dL — ABNORMAL HIGH (ref 70–99)

## 2019-11-09 SURGERY — ARTHROSCOPY, KNEE, WITH MEDIAL MENISCECTOMY
Anesthesia: General | Site: Knee | Laterality: Left

## 2019-11-09 MED ORDER — 0.9 % SODIUM CHLORIDE (POUR BTL) OPTIME
TOPICAL | Status: DC | PRN
  Administered 2019-11-09: 1000 mL

## 2019-11-09 MED ORDER — LIDOCAINE HCL (CARDIAC) PF 100 MG/5ML IV SOSY
PREFILLED_SYRINGE | INTRAVENOUS | Status: DC | PRN
  Administered 2019-11-09: 80 mg via INTRAVENOUS

## 2019-11-09 MED ORDER — PROPOFOL 10 MG/ML IV BOLUS
INTRAVENOUS | Status: DC | PRN
  Administered 2019-11-09: 200 mg via INTRAVENOUS

## 2019-11-09 MED ORDER — EPINEPHRINE PF 1 MG/ML IJ SOLN
INTRAMUSCULAR | Status: AC
  Filled 2019-11-09: qty 1

## 2019-11-09 MED ORDER — MIDAZOLAM HCL 5 MG/5ML IJ SOLN
INTRAMUSCULAR | Status: DC | PRN
  Administered 2019-11-09: 2 mg via INTRAVENOUS

## 2019-11-09 MED ORDER — OXYCODONE HCL 5 MG PO TABS
ORAL_TABLET | ORAL | Status: AC
  Filled 2019-11-09: qty 1

## 2019-11-09 MED ORDER — FENTANYL CITRATE (PF) 250 MCG/5ML IJ SOLN
INTRAMUSCULAR | Status: DC | PRN
  Administered 2019-11-09 (×5): 50 ug via INTRAVENOUS

## 2019-11-09 MED ORDER — BUPIVACAINE HCL (PF) 0.25 % IJ SOLN
INTRAMUSCULAR | Status: DC | PRN
  Administered 2019-11-09: 20 mL

## 2019-11-09 MED ORDER — FENTANYL CITRATE (PF) 100 MCG/2ML IJ SOLN
INTRAMUSCULAR | Status: AC
  Filled 2019-11-09: qty 2

## 2019-11-09 MED ORDER — SODIUM CHLORIDE 0.9 % IR SOLN
Status: DC | PRN
  Administered 2019-11-09: 3000 mL

## 2019-11-09 MED ORDER — PROMETHAZINE HCL 25 MG/ML IJ SOLN: 6.2500 mg | INTRAMUSCULAR | Status: DC | PRN

## 2019-11-09 MED ORDER — OXYCODONE-ACETAMINOPHEN 5-325 MG PO TABS: 1.0000 | ORAL_TABLET | Freq: Four times a day (QID) | ORAL | 0 refills | Status: DC | PRN

## 2019-11-09 MED ORDER — BUPIVACAINE HCL (PF) 0.25 % IJ SOLN
INTRAMUSCULAR | Status: AC
  Filled 2019-11-09: qty 30

## 2019-11-09 MED ORDER — OXYCODONE HCL 5 MG/5ML PO SOLN: 5.0000 mg | Freq: Once | ORAL | Status: AC | PRN

## 2019-11-09 MED ORDER — FENTANYL CITRATE (PF) 250 MCG/5ML IJ SOLN
INTRAMUSCULAR | Status: AC
  Filled 2019-11-09: qty 5

## 2019-11-09 MED ORDER — OXYCODONE HCL 5 MG PO TABS
5.0000 mg | ORAL_TABLET | Freq: Once | ORAL | Status: AC | PRN
  Administered 2019-11-09: 5 mg via ORAL

## 2019-11-09 MED ORDER — CHLORHEXIDINE GLUCONATE 4 % EX LIQD: 60.0000 mL | Freq: Once | CUTANEOUS | Status: DC

## 2019-11-09 MED ORDER — MIDAZOLAM HCL 2 MG/2ML IJ SOLN
INTRAMUSCULAR | Status: AC
  Filled 2019-11-09: qty 2

## 2019-11-09 MED ORDER — ONDANSETRON HCL 4 MG/2ML IJ SOLN
INTRAMUSCULAR | Status: DC | PRN
  Administered 2019-11-09: 4 mg via INTRAVENOUS

## 2019-11-09 MED ORDER — EPINEPHRINE PF 1 MG/ML IJ SOLN
INTRAMUSCULAR | Status: DC | PRN
  Administered 2019-11-09: 1 mg

## 2019-11-09 MED ORDER — LACTATED RINGERS IV SOLN
INTRAVENOUS | Status: DC
  Administered 2019-11-09: 11:00:00 via INTRAVENOUS

## 2019-11-09 MED ORDER — FENTANYL CITRATE (PF) 100 MCG/2ML IJ SOLN
25.0000 ug | INTRAMUSCULAR | Status: DC | PRN
  Administered 2019-11-09: 25 ug via INTRAVENOUS

## 2019-11-09 MED ORDER — DEXAMETHASONE SODIUM PHOSPHATE 10 MG/ML IJ SOLN
INTRAMUSCULAR | Status: DC | PRN
  Administered 2019-11-09: 10 mg via INTRAVENOUS

## 2019-11-09 SURGICAL SUPPLY — 38 items
BLADE CUDA 5.5 (BLADE) IMPLANT
BLADE CUTTER GATOR 3.5 (BLADE) IMPLANT
BLADE EXCALIBUR 4.0MM X 13CM (MISCELLANEOUS) ×1
BLADE EXCALIBUR 4.0X13 (MISCELLANEOUS) ×2 IMPLANT
BNDG ELASTIC 6X5.8 VLCR STR LF (GAUZE/BANDAGES/DRESSINGS) ×3 IMPLANT
BNDG GAUZE ELAST 4 BULKY (GAUZE/BANDAGES/DRESSINGS) ×3 IMPLANT
COVER WAND RF STERILE (DRAPES) ×3 IMPLANT
CUFF TOURN SGL QUICK 34 (TOURNIQUET CUFF)
CUFF TOURN SGL QUICK 42 (TOURNIQUET CUFF) IMPLANT
CUFF TRNQT CYL 34X4.125X (TOURNIQUET CUFF) IMPLANT
DRAPE ARTHROSCOPY W/POUCH 114 (DRAPES) ×3 IMPLANT
DRAPE HALF SHEET 40X57 (DRAPES) ×3 IMPLANT
DRAPE U-SHAPE 47X51 STRL (DRAPES) ×3 IMPLANT
DRSG EMULSION OIL 3X3 NADH (GAUZE/BANDAGES/DRESSINGS) ×3 IMPLANT
DRSG PAD ABDOMINAL 8X10 ST (GAUZE/BANDAGES/DRESSINGS) ×3 IMPLANT
DURAPREP 26ML APPLICATOR (WOUND CARE) ×3 IMPLANT
FILTER STRAW FLUID ASPIR (MISCELLANEOUS) ×3 IMPLANT
GAUZE SPONGE 4X4 12PLY STRL (GAUZE/BANDAGES/DRESSINGS) ×3 IMPLANT
GLOVE BIOGEL PI IND STRL 8 (GLOVE) ×2 IMPLANT
GLOVE BIOGEL PI INDICATOR 8 (GLOVE) ×4
GLOVE ECLIPSE 7.5 STRL STRAW (GLOVE) ×6 IMPLANT
GOWN STRL REUS W/ TWL LRG LVL3 (GOWN DISPOSABLE) ×1 IMPLANT
GOWN STRL REUS W/ TWL XL LVL3 (GOWN DISPOSABLE) ×2 IMPLANT
GOWN STRL REUS W/TWL LRG LVL3 (GOWN DISPOSABLE) ×2
GOWN STRL REUS W/TWL XL LVL3 (GOWN DISPOSABLE) ×4
KIT BASIN OR (CUSTOM PROCEDURE TRAY) ×3 IMPLANT
KIT TURNOVER KIT B (KITS) ×3 IMPLANT
NEEDLE 18GX1X1/2 (RX/OR ONLY) (NEEDLE) ×3 IMPLANT
PACK ARTHROSCOPY DSU (CUSTOM PROCEDURE TRAY) ×3 IMPLANT
PAD ARMBOARD 7.5X6 YLW CONV (MISCELLANEOUS) ×6 IMPLANT
PAD CAST 4YDX4 CTTN HI CHSV (CAST SUPPLIES) ×2 IMPLANT
PADDING CAST COTTON 4X4 STRL (CAST SUPPLIES) ×4
SUT ETHILON 4 0 PS 2 18 (SUTURE) ×3 IMPLANT
SYR 5ML LL (SYRINGE) ×3 IMPLANT
TOWEL GREEN STERILE (TOWEL DISPOSABLE) ×3 IMPLANT
TOWEL GREEN STERILE FF (TOWEL DISPOSABLE) ×3 IMPLANT
TUBING ARTHROSCOPY IRRIG 16FT (MISCELLANEOUS) ×3 IMPLANT
WRAP KNEE MAXI GEL POST OP (GAUZE/BANDAGES/DRESSINGS) ×3 IMPLANT

## 2019-11-09 NOTE — Anesthesia Preprocedure Evaluation (Addendum)
Anesthesia Evaluation  Patient identified by MRN, date of birth, ID band Patient awake    Reviewed: Allergy & Precautions, NPO status , Patient's Chart, lab work & pertinent test results  History of Anesthesia Complications (+) PONV and history of anesthetic complications  Airway Mallampati: II  TM Distance: >3 FB Neck ROM: Full    Dental  (+) Dental Advisory Given, Caps   Pulmonary asthma , former smoker,    Pulmonary exam normal        Cardiovascular hypertension, Pt. on medications (-) anginaNormal cardiovascular exam     Neuro/Psych PSYCHIATRIC DISORDERS Anxiety Depression negative neurological ROS     GI/Hepatic Neg liver ROS, GERD  Medicated and Controlled,  Endo/Other  diabetes, Type 2, Oral Hypoglycemic AgentsMorbid obesity  Renal/GU negative Renal ROS     Musculoskeletal  (+) Arthritis ,   Abdominal   Peds  Hematology negative hematology ROS (+)   Anesthesia Other Findings   Reproductive/Obstetrics                            Anesthesia Physical Anesthesia Plan  ASA: III  Anesthesia Plan: General   Post-op Pain Management:    Induction: Intravenous  PONV Risk Score and Plan: 4 or greater and Treatment may vary due to age or medical condition, Ondansetron, Dexamethasone and Midazolam  Airway Management Planned: LMA  Additional Equipment: None  Intra-op Plan:   Post-operative Plan: Extubation in OR  Informed Consent: I have reviewed the patients History and Physical, chart, labs and discussed the procedure including the risks, benefits and alternatives for the proposed anesthesia with the patient or authorized representative who has indicated his/her understanding and acceptance.     Dental advisory given  Plan Discussed with: CRNA and Anesthesiologist  Anesthesia Plan Comments:        Anesthesia Quick Evaluation

## 2019-11-09 NOTE — Brief Op Note (Signed)
11/09/2019  3:07 PM  PATIENT:  Rachel Norris  58 y.o. female  PRE-OPERATIVE DIAGNOSIS:  LEFT KNEE MEDIAL AND LATERAL MENISCUS TEAR  POST-OPERATIVE DIAGNOSIS:  LEFT KNEE MEDIAL AND LATERAL MENISCUS TEAR  PROCEDURE:  Procedure(s): ARTHROSCOPY KNEE WITH PARTIAL MEDIAL MENISECTOMY, PARTIAL LATERAL MENISECTOMY, MEDIAL CHONDROPLASTY, MEDIAL PLICA EXCISION AND PATELLA FEMORAL CHONDROPLASTY (Left)  SURGEON:  Surgeon(s) and Role:    Dorna Leitz, MD - Primary  PHYSICIAN ASSISTANT:   ASSISTANTS: jim bethune   ANESTHESIA:   general  EBL:  100 mL   BLOOD ADMINISTERED:none  DRAINS: none   LOCAL MEDICATIONS USED:  MARCAINE     SPECIMEN:  No Specimen  DISPOSITION OF SPECIMEN:  N/A  COUNTS:  YES  TOURNIQUET:  * No tourniquets in log *  DICTATION: .Other Dictation: Dictation Number [id number]  PLAN OF CARE: Discharge to home after PACU  PATIENT DISPOSITION:  PACU - hemodynamically stable.   Delay start of Pharmacological VTE agent (>24hrs) due to surgical blood loss or risk of bleeding: no

## 2019-11-09 NOTE — Discharge Instructions (Signed)
POST-OP KNEE ARTHROSCOPY INSTRUCTIONS  °Dr. John Graves/Jim Shriyans Kuenzi PA-C ° °Pain °You will be expected to have a moderate amount of pain in the affected knee for approximately two weeks. However, the first two days will be the most severe pain. A prescription has been provided to take as needed for the pain. The pain can be reduced by applying ice packs to the knee for the first 1-2 weeks post surgery. Also, keeping the leg elevated on pillows will help alleviate the pain. If you develop any acute pain or swelling in your calf muscle, please call the doctor. ° °Activity °It is preferred that you stay at bed rest for approximately 24 hours. However, you may go to the bathroom with help. Weight bearing as tolerated. You may begin the knee exercises the day of surgery. Discontinue crutches as the knee pain resolves. ° °Dressing °Keep the dressing dry. If the ace bandage should wrinkle or roll up, this can be rewrapped to prevent ridges in the bandage. You may remove all dressings in 48 hours,  apply bandaids to each wound. You may shower on the 4th day after surgery but no tub bath. ° °Symptoms to report to your doctor °Extreme pain °Extreme swelling °Temperature above 101 degrees °Change in the feeling, color, or movement of your toes °Redness, heat, or swelling at your incision ° °Exercise °If is preferred that as soon as possible you try to do a straight leg raise without bending the knee and concentrate on bringing the heel of your foot off the bed up to approximately 45 degrees and hold for the count of 10 seconds. Repeat this at least 10 times three or four times per day. Additional exercises are provided below. ° °You are encouraged to bend the knee as tolerated. ° °Follow-Up °Call to schedule a follow-up appointment in 5-7 days. Our office # is [phone]. ° °POST-OP EXERCISES ° °Short Arc Quads ° °1. Lie on back with legs straight. Place towel roll under thigh, just above knee. °2. Tighten thigh muscles to  straighten knee and lift heel off bed. °3. Hold for slow count of five, then lower. °4. Do three sets of ten ° ° ° °Straight Leg Raises ° °1. Lie on back with operative leg straight and other leg bent. °2. Keeping operative leg completely straight, slowly lift operative leg so foot is 5 inches off bed. °3. Hold for slow count of five, then lower. °4. Do three sets of ten. ° ° ° °DO BOTH EXERCISES 2 TIMES A DAY ° °Ankle Pumps ° °Work/move the operative ankle and foot up and down 10 times every hour while awake. °

## 2019-11-09 NOTE — Anesthesia Postprocedure Evaluation (Signed)
Anesthesia Post Note  Patient: Rachel Norris  Procedure(s) Performed: ARTHROSCOPY KNEE WITH PARTIAL MEDIAL MENISECTOMY, PARTIAL LATERAL MENISECTOMY, MEDIAL CHONDROPLASTY, MEDIAL PLICA EXCISION AND PATELLA FEMORAL CHONDROPLASTY (Left Knee)     Patient location during evaluation: PACU Anesthesia Type: General Level of consciousness: awake and alert Pain management: pain level controlled Vital Signs Assessment: post-procedure vital signs reviewed and stable Respiratory status: spontaneous breathing, nonlabored ventilation and respiratory function stable Cardiovascular status: blood pressure returned to baseline and stable Postop Assessment: no apparent nausea or vomiting Anesthetic complications: no    Last Vitals:  Vitals:   11/09/19 1409 11/09/19 1424  BP: (!) 152/91 138/82  Pulse: 89 92  Resp: 17 19  Temp:    SpO2: 96% 96%                  Audry Pili

## 2019-11-09 NOTE — H&P (Signed)
A pre op hand p   Chief Complaint: Left knee pain  HPI: Rachel Norris is a 58 y.o. female who presents for evaluation of left knee pain. It has been present for greater than 3 months and has been worsening. She has failed conservative measures. Pain is rated as moderate.  Past Medical History:  Diagnosis Date  . Allergic rhinitis   . Ankle swelling   . Anxiety   . Anxiety and depression   . Arthritis    generalized arthritis -knees, feet.back.   . 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 At risk for sleep apnea    STOP-BANG= 5            SENT TO PCP 04-05-2015  . Bronchitis    Bronchitis a few months ago- no issues now. - no Inhalers used daily.  Tennis Must Quervain's tenosynovitis, right   . Depression   . Diabetes mellitus without complication ([place])    "pre diabetes"  . Fibroid   . GERD (gastroesophageal reflux disease)   . Heart palpitations   . Hormone disorder   . Hypertension   . Irregular menstrual bleeding   . Labial cyst    bilateral icclusion  . Perimenopausal   . PONV (postoperative nausea and vomiting)   . Pre-diabetes   . Prediabetes   . Shortness of breath dyspnea    with exertion, climbing a flight of stairs  . Thickened endometrium   . Wears glasses    Past Surgical History:  Procedure Laterality Date  . COLONOSCOPY WITH PROPOFOL N/A 05/15/2016   Procedure: COLONOSCOPY WITH PROPOFOL;  Surgeon: Mauri Pole, MD;  Location: [facility];  Service: Endoscopy;  Laterality: N/A;  . DILATATION & CURETTAGE/HYSTEROSCOPY WITH MYOSURE N/A 08/30/2016   Procedure: DILATATION & CURETTAGE/HYSTEROSCOPY WITH MYOSURE;  Surgeon: Salvadore Dom, MD;  Location: [facility];  Service: Gynecology;  Laterality: N/A;  hysteroscopic myomectomy. BMI 53.8  . DILATION AND CURETTAGE OF UTERUS    . EAR CYST EXCISION Bilateral 04/08/2015   Procedure: removal of bilateral labial inclusion cysts;  Surgeon: Linda Hedges, DO;  Location: [facility];  Service:  Gynecology;  Laterality: Bilateral;  . HYSTEROSCOPY    . HYSTEROSCOPY W/D&C N/A 04/08/2015   Procedure: DILATATION AND CURETTAGE /HYSTEROSCOPY, removal of bilateral labial inclusion cysts;  Surgeon: Linda Hedges, DO;  Location: [facility];  Service: Gynecology;  Laterality: N/A;  . LASER ABLATION OF THE CERVIX  1992   DYSPLAGIA  . MYOMECTOMY N/A 08/30/2016   Procedure: HYSTEROSCOPIC MYOMECTOMY;  Surgeon: Salvadore Dom, MD;  Location: [facility];  Service: Gynecology;  Laterality: N/A;  . 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 Marital status: Married    Spouse name: Not on file  . Number of children: 0  . Years of education: Not on file  . Highest education level: Not on file  Occupational History  . 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 Financial resource strain: Not on file  . Food insecurity    Worry: Not on file    Inability: Not on file  . Transportation needs    Medical: Not on file    Non-medical: Not on file  Tobacco Use  . Smoking status: Former Smoker  Packs/day: 1.00    Years: 20.00    Pack years: 20.00    Types: Cigarettes    Quit date: 04/04/1996    Years since quitting: 23.6  . Smokeless tobacco: Never Used  Substance and Sexual Activity  . Alcohol use: Yes    Alcohol/week: 0.0 standard drinks    Comment: rarely  . Drug use: No  . Sexual activity: Not Currently    Partners: Male    Birth control/protection: Post-menopausal  Lifestyle  . Physical activity    Days per week: Not on file    Minutes per session: Not on file  . Stress: Not on file  Relationships  . Social Herbalist on phone: Not on file    Gets together: Not on file    Attends religious service: Not on file    Active member of club or  organization: Not on file    Attends meetings of clubs or organizations: Not on file    Relationship status: Not on file  Other Topics Concern  . Not on file  Social History Narrative   Lives w/ husband    Retired 10-2019   Family History  Problem Relation Age of Onset  . Diabetes Mother        M anmd others  . Dementia Mother   . Stroke Mother   . Stroke Father   . AAA (abdominal aortic aneurysm) Father   . Atrial fibrillation Sister   . Asthma Sister   . Allergic rhinitis Sister   . Bronchitis Sister   . Angioedema Sister   . Allergic rhinitis Brother   . Asthma Other   . Allergic rhinitis Sister   . Bronchitis Sister   . Colon cancer Neg Hx   . Breast cancer Neg Hx   . Coronary artery disease Neg Hx   . Eczema Neg Hx   . Urticaria Neg Hx   . Immunodeficiency Neg Hx    Allergies  Allergen Reactions  . Sulfa Antibiotics Hives   Prior to Admission medications   Medication Sig Start Date End Date Taking? Authorizing Provider  ACCU-CHEK GUIDE test strip USE TO CHECK BLOOD SUGAR TWICE DAILY 09/08/19  Yes Colon Branch, MD  acetaminophen (TYLENOL) 650 MG CR tablet Take 1,300 mg by mouth every 8 (eight) hours as needed for pain.   Yes [provider]  blood glucose meter kit and supplies Dispense based on patient and insurance preference.Check blood sugar twice daily. Dx: E11.9 07/24/19  Yes Colon Branch, MD  diphenhydrAMINE HCl, Sleep, (UNISOM SLEEPGELS) 50 MG CAPS Take 50 mg by mouth at bedtime as needed (sleep).   Yes [provider]  felodipine (PLENDIL) 5 MG 24 hr tablet Take 1 tablet (5 mg total) by mouth daily. 11/06/19  Yes Paz, Alda Berthold, MD  fexofenadine (ALLEGRA) 180 MG tablet Take 180 mg by mouth daily as needed for allergies or rhinitis.   Yes [provider]  fluticasone (FLONASE) 50 MCG/ACT nasal spray Place 2 sprays into both nostrils daily. Patient taking differently: Place 2 sprays into both nostrils daily as needed for allergies.  04/28/19   Yes Valentina Shaggy, MD  hydrochlorothiazide (HYDRODIURIL) 25 MG tablet Take 1 tablet (25 mg total) by mouth daily. 07/24/19  Yes Paz, Alda Berthold, MD  hydrocortisone cream 1 % Apply 1 application topically 2 (two) times daily as needed (rash).   Yes [provider]  Lancets (ACCU-CHEK SOFT TOUCH) lancets CHECK BLOOD SUGAR ONCE DAILY  01/30/19  Yes Paz, Alda Berthold, MD  losartan (COZAAR) 100 MG tablet Take 1 tablet (100 mg total) by mouth daily. Patient taking differently: Take 100 mg by mouth daily with supper.  09/23/19  Yes Paz, Alda Berthold, MD  Menthol, Topical Analgesic, (BIOFREEZE EX) Apply 1 application topically daily as needed (knee pain).   Yes [provider]  metFORMIN (GLUCOPHAGE) 500 MG tablet Take 1 tablet (500 mg total) by mouth 2 (two) times daily with a meal. 07/31/19  Yes Paz, Alda Berthold, MD  Naphazoline-Pheniramine (OPCON-A) 0.027-0.315 % SOLN Place 1 drop into both eyes daily as needed (allergies).   Yes [provider]  naproxen sodium (ALEVE) 220 MG tablet Take 440 mg by mouth 2 (two) times daily as needed (For pain.).    Yes [provider]  OVER THE COUNTER MEDICATION Take 2 tablets by mouth 2 (two) times a week. Super beet chews   Yes [provider]  traMADol (ULTRAM) 50 MG tablet Take by mouth every 6 (six) hours as needed.   Yes [provider]  albuterol (VENTOLIN HFA) 108 (90 Base) MCG/ACT inhaler Inhale 2 puffs into the lungs every 4 (four) hours as needed for wheezing or shortness of breath.  01/04/16   [provider]  budesonide-formoterol (SYMBICORT) 160-4.5 MCG/ACT inhaler Inhale 2 puffs into the lungs 2 (two) times daily. Use with spacer device. Patient taking differently: Inhale 2 puffs into the lungs 2 (two) times daily as needed (shortness of breath). Use with spacer device. 08/01/18   Valentina Shaggy, MD  neomycin-bacitracin-polymyxin (NEOSPORIN) ointment Apply 1 application topically daily as needed for wound  care.    [provider]  omeprazole (PRILOSEC) 40 MG capsule Take 1 capsule (40 mg total) by mouth daily as needed (For heartburn or acid reflux.). 07/24/19   Colon Branch, MD     Positive ROS: None  All other systems have been reviewed and were otherwise negative with the exception of those mentioned in the HPI and as above.  Physical Exam: Vitals:   11/09/19 1054  BP: (!) 152/56  Pulse: 85  Resp: 20  Temp: 98 F (36.7 C)  SpO2: 96%    General: Alert, no acute distress Cardiovascular: No pedal edema Respiratory: No cyanosis, no use of accessory musculature GI: No organomegaly, abdomen is soft and non-tender Skin: No lesions in the area of chief complaint Neurologic: Sensation intact distally Psychiatric: Patient is competent for consent with normal mood and affect Lymphatic: No axillary or cervical lymphadenopathy  MUSCULOSKELETAL: Left knee: Painful range of motion.  Limited range of motion.  No instability.  MRI: MRI shows medial lateral meniscal tears with some chondromalacia  Assessment/Plan: LEFT KNEE MEDIAL AND LATERAL MENISCUS TEAR Plan for Procedure(s): ARTHROSCOPY KNEE  The risks benefits and alternatives were discussed with the patient including but not limited to the risks of nonoperative treatment, versus surgical intervention including infection, bleeding, nerve injury, malunion, nonunion, hardware prominence, hardware failure, need for hardware removal, blood clots, cardiopulmonary complications, morbidity, mortality, among others, and they were willing to proceed.  Predicted outcome is good, although there will be at least a six to nine month expected recovery.  Alta Corning, MD 11/09/2019 12:21 PM

## 2019-11-09 NOTE — Anesthesia Procedure Notes (Signed)
Procedure Name: LMA Insertion Date/Time: 11/09/2019 12:53 PM Performed by: Mariea Clonts, CRNA Pre-anesthesia Checklist: Patient identified, Emergency Drugs available, Suction available and Patient being monitored Patient Re-evaluated:Patient Re-evaluated prior to induction Oxygen Delivery Method: Circle System Utilized Preoxygenation: Pre-oxygenation with 100% oxygen Induction Type: IV induction LMA: LMA inserted LMA Size: 4.0 Number of attempts: 1 Airway Equipment and Method: Bite block Placement Confirmation: positive ETCO2 Tube secured with: Tape Dental Injury: Teeth and Oropharynx as per pre-operative assessment

## 2019-11-09 NOTE — Transfer of Care (Signed)
Immediate Anesthesia Transfer of Care Note  Patient: Rachel Norris  Procedure(s) Performed: ARTHROSCOPY KNEE WITH PARTIAL MEDIAL MENISECTOMY, PARTIAL LATERAL MENISECTOMY, MEDIAL CHONDROPLASTY, MEDIAL PLICA EXCISION AND PATELLA FEMORAL CHONDROPLASTY (Left Knee)  Patient Location: PACU  Anesthesia Type:General  Level of Consciousness: awake, alert  and oriented  Airway & Oxygen Therapy: Patient Spontanous Breathing and Patient connected to nasal cannula oxygen  Post-op Assessment: Report given to RN, Post -op Vital signs reviewed and stable and Patient moving all extremities X 4  Post vital signs: Reviewed and stable  Last Vitals:  Vitals Value Taken Time  BP 143/122 11/09/19 1339  Temp    Pulse 100 11/09/19 1341  Resp 26 11/09/19 1341  SpO2 100 % 11/09/19 1341  Vitals shown include unvalidated device data.  Last Pain:  Vitals:   11/09/19 1119  TempSrc:   PainSc: 4       Patients Stated Pain Goal: 3 ([id number])  Complications: No apparent anesthesia complications

## 2019-11-10 ENCOUNTER — Encounter (HOSPITAL_COMMUNITY): Payer: Self-pay | Admitting: Orthopedic Surgery

## 2019-11-10 NOTE — Op Note (Signed)
NAME: Rachel Norris, Rachel Norris MEDICAL RECORD [id number] ACCOUNT [id number] DATE OF BIRTH:[birth year] FACILITY: [facility] LOCATION: [facility] PHYSICIAN:Chiron Campione L. Yamari Ventola, MD  OPERATIVE REPORT  DATE OF PROCEDURE:  11/09/2019  PREOPERATIVE DIAGNOSIS:  Medial and lateral meniscal tears.  POSTOPERATIVE DIAGNOSES: 1.  Medial and lateral meniscal tears.   2.  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 3.  Large medial shelf plica.  PROCEDURE:   1.  Left knee arthroscopy with partial medial and partial lateral meniscectomies. 2.  Chondroplasty, medial and patellofemoral joint. 3.  Medial plica excision.  SURGEON:  Dorna Leitz, MD  ASSISTANT:  Gaspar Skeeters PA-C, was present for the entire case and assisted by manipulation of the leg and closing to minimize OR time.  BRIEF HISTORY:  The patient is a morbidly obese patient who has been having significant left knee pain.  She had x-rays, which did not show any bone-on-bone change and narrowing.  She had MRI which showed a radial tear of the medial meniscus as well as  an anterior horn of the lateral meniscus.  We talked to her about treatment options, but given the amount of pain she was having felt that arthroscopy would be reasonable.  She was brought to the operating room for this procedure.  DESCRIPTION OF PROCEDURE:  The patient brought to the operating room after adequate anesthesia was obtained with a general anesthetic.  The patient was placed supine on the operating table.  Left leg was prepped and draped in usual sterile fashion.   Following this, routine arthroscopic examination of the knee revealed that there was obvious medial meniscal tear with a radial component.  This was debrided back to a meniscal rim and the remaining meniscus was contoured down with a suction shaver.   Following this, attention turned to the medial femoral condyle, which had some grade II, grade III and maybe some subtle grade IV changes.  This was  debrided with a suction shaver to a smooth stable rim.  Large draping medial shelf plica was identified  and debrided prior to getting into the medial compartment.  At this point, we turned to the ACL, which was normal.  Went over laterally where there is unfortunately an anterior horn lateral meniscal tear.  This was debrided back to smooth stable rim and  around towards the mid body of the meniscus again contouring was going to preserve as much meniscus as possible.  Lateral femoral condyle and tibial plateau looked pristine.  Attention was turned back to patellofemoral joint with some grade II changes on  the posterior surface of the patella as well as in the trochlea.  These were debrided.  The knee was then copiously and thoroughly irrigated and suctioned dry.  The portals were closed with a bandage.  20 mL of 0.25% Marcaine was instilled in the knee  for postoperative anesthesia.  Sterile compressive dressing was applied.  The patient was taken to recovery was noted to be in satisfactory condition.  Estimated blood loss for procedure was minimal.  CN/NUANCE  D:11/09/2019 T:11/10/2019 JOB:[id number]

## 2020-01-22 ENCOUNTER — Ambulatory Visit: Payer: BC Managed Care – PPO | Admitting: Internal Medicine

## 2020-02-02 ENCOUNTER — Other Ambulatory Visit: Payer: Self-pay | Admitting: Internal Medicine

## 2020-02-10 ENCOUNTER — Other Ambulatory Visit (HOSPITAL_BASED_OUTPATIENT_CLINIC_OR_DEPARTMENT_OTHER): Payer: Self-pay | Admitting: Internal Medicine

## 2020-02-10 DIAGNOSIS — Z1231 Encounter for screening mammogram for malignant neoplasm of breast: Secondary | ICD-10-CM

## 2020-02-15 ENCOUNTER — Ambulatory Visit (HOSPITAL_BASED_OUTPATIENT_CLINIC_OR_DEPARTMENT_OTHER)
Admission: RE | Admit: 2020-02-15 | Discharge: 2020-02-15 | Disposition: A | Discharge status: Home / Self Care | Payer: BC Managed Care – PPO | Source: Ambulatory Visit | Attending: Internal Medicine | Admitting: Internal Medicine

## 2020-02-15 ENCOUNTER — Other Ambulatory Visit: Payer: Self-pay

## 2020-02-15 DIAGNOSIS — Z1231 Encounter for screening mammogram for malignant neoplasm of breast: Secondary | ICD-10-CM | POA: Diagnosis not present

## 2020-02-25 ENCOUNTER — Ambulatory Visit (INDEPENDENT_AMBULATORY_CARE_PROVIDER_SITE_OTHER): Payer: BC Managed Care – PPO | Admitting: Family Medicine

## 2020-02-25 ENCOUNTER — Other Ambulatory Visit (INDEPENDENT_AMBULATORY_CARE_PROVIDER_SITE_OTHER): Payer: Self-pay | Admitting: Family Medicine

## 2020-02-25 ENCOUNTER — Other Ambulatory Visit: Payer: Self-pay

## 2020-02-25 ENCOUNTER — Encounter (INDEPENDENT_AMBULATORY_CARE_PROVIDER_SITE_OTHER): Payer: Self-pay | Admitting: Family Medicine

## 2020-02-25 VITALS — BP 146/80 | HR 82 | Temp 98.3°F | Ht 63.0 in | Wt 319.0 lb

## 2020-02-25 DIAGNOSIS — E119 Type 2 diabetes mellitus without complications: Secondary | ICD-10-CM

## 2020-02-25 DIAGNOSIS — J45909 Unspecified asthma, uncomplicated: Secondary | ICD-10-CM | POA: Diagnosis not present

## 2020-02-25 DIAGNOSIS — Z0289 Encounter for other administrative examinations: Secondary | ICD-10-CM

## 2020-02-25 DIAGNOSIS — R5383 Other fatigue: Secondary | ICD-10-CM | POA: Diagnosis not present

## 2020-02-25 DIAGNOSIS — Z1331 Encounter for screening for depression: Secondary | ICD-10-CM | POA: Diagnosis not present

## 2020-02-25 DIAGNOSIS — I10 Essential (primary) hypertension: Secondary | ICD-10-CM | POA: Diagnosis not present

## 2020-02-25 DIAGNOSIS — Z6841 Body Mass Index (BMI) 40.0 and over, adult: Secondary | ICD-10-CM

## 2020-02-25 NOTE — Progress Notes (Signed)
Chief Complaint:   OBESITY Rachel Norris (MR# [id number]) is a 59 y.o. female who presents for evaluation and treatment of obesity and related comorbidities. Current BMI is Body mass index is 56.51 kg/m. Rachel Norris has been struggling with her weight for many years and has been unsuccessful in either losing weight, maintaining weight loss, or reaching her healthy weight goal.  Gordon is currently in the action stage of change and ready to dedicate time achieving and maintaining a healthier weight. Rachel Norris is interested in becoming our patient and working on intensive lifestyle modifications including (but not limited to) diet and exercise for weight loss.  [name]'s habits were reviewed today and are as follows: Her family eats meals together, she thinks her family will eat healthier with her, her desired weight loss is 169 lbs, she started gaining weight in her 40's-50's, her heaviest weight ever was 320 pounds, she is a picky eater and doesn't like to eat healthier foods, she has significant food cravings issues, she skips meals frequently, she is frequently drinking liquids with calories, she frequently makes poor food choices, she has problems with excessive hunger, she frequently eats larger portions than normal and she struggles with emotional eating.  Depression Screen Rachel Norris's Food and Mood (modified PHQ-9) score was 12.  Depression screen [facility] 2/9 02/25/2020  Decreased Interest 2  Down, Depressed, Hopeless 1  PHQ - 2 Score 3  Altered sleeping 2  Tired, decreased energy 3  Change in appetite 1  Feeling bad or failure about yourself  1  Trouble concentrating 1  Moving slowly or fidgety/restless 1  Suicidal thoughts 0  PHQ-9 Score 12  Difficult doing work/chores Not difficult at all   Subjective:   1. Other fatigue [name] admits to daytime somnolence and admits to waking up still tired. Patent has a history of symptoms of daytime fatigue. Rachel Norris generally gets 6 or 8 hours of  sleep per night, and states that she has generally restful sleep. Snoring is present. Apneic episodes are present. [name] Sleepiness Score is 7.  2. Type 2 diabetes mellitus without complication, unspecified whether long term insulin use (HCC) Jodelle's fasting BGs mostly range between 125 and 130, 2 hour post prandials range between 130 and 150. Last A1c was elevated at 6.9.  3. Essential hypertension [name]'s blood pressure is elevated today. She is medications, and she is ready to work on diet and weight loss.  4. Uncomplicated asthma, unspecified asthma severity, unspecified whether persistent [name]'s asthma is allergy induced, and is mildly intermittent. She denies a history of intubation or rare exacerbations.  Assessment/Plan:   1. Other fatigue [name] does feel that her weight is causing her energy to be lower than it should be. Fatigue may be related to obesity, depression or many other causes. Labs will be ordered, and in the meanwhile, [name] will focus on self care including making healthy food choices, increasing physical activity and focusing on stress reduction.  - EKG 12-Lead - Vitamin B12 - CBC with Differential/Platelet - Folate - T3 - T4, free - TSH - VITAMIN D 25 Hydroxy (Vit-D Deficiency, Fractures)  2. Type 2 diabetes mellitus without complication, unspecified whether long term insulin use (HCC) Good blood sugar control is important to decrease the likelihood of diabetic complications such as nephropathy, neuropathy, limb loss, blindness, coronary artery disease, and death. Intensive lifestyle modification including diet, exercise and weight loss are the first line of treatment for diabetes. We will check labs today. Rachel Norris will start her  diet prescription, and she is to check her BGs BID and will follow up.  - Hemoglobin A1c - Insulin, random - Lipid Panel With LDL/HDL Ratio  3. Essential hypertension Rachel Norris will start her diet, and she will continue to work on  healthy weight loss and exercise to improve blood pressure control. We will watch for signs of hypotension as she continues her lifestyle modifications. We will check labs today.  - Comprehensive metabolic panel  4. Uncomplicated asthma, unspecified asthma severity, unspecified whether persistent IC was done today. Rachel Norris is to continue with medications and will avoid exacerbations.  5. Depression screening [name] had a positive depression screening. Depression is commonly associated with obesity and often results in emotional eating behaviors. We will monitor this closely and work on CBT to help improve the non-hunger eating patterns. Referral to Psychology may be required if no improvement is seen as she continues in our clinic.  6. Class 3 severe obesity with serious comorbidity and body mass index (BMI) of 50.0 to 59.9 in adult, unspecified obesity type Rachel Norris) [name] is currently in the action stage of change and her goal is to continue with weight loss efforts. I recommend Rachel Norris begin the structured treatment plan as follows:  She has agreed to the Category 3 Plan.  Exercise goals: No exercise has been prescribed for now, while we concentrate on nutritional changes.   Behavioral modification strategies: increasing lean protein intake and meal planning and cooking strategies.  She was informed of the importance of frequent follow-up visits to maximize her success with intensive lifestyle modifications for her multiple health conditions. She was informed we would discuss her lab results at her next visit unless there is a critical issue that needs to be addressed sooner. Mystery agreed to keep her next visit at the agreed upon time to discuss these results.  Objective:   Blood pressure (!) 146/80, pulse 82, temperature 98.3 F (36.8 C), temperature source Oral, height 5\' 3"  (1.6 m), weight (!) 319 lb (144.7 kg), last menstrual period 09/15/2016, SpO2 94 %. Body mass index is 56.51 kg/m.  EKG:  Normal sinus rhythm, rate 79 BPM.  Indirect Calorimeter completed today shows a VO2 of 257 and a REE of 1788.  Her calculated basal metabolic rate is [id number] thus her basal metabolic rate is worse than expected.  General: Cooperative, alert, well developed, in no acute distress. HEENT: Conjunctivae and lids unremarkable. Cardiovascular: Regular rhythm.  Lungs: Normal work of breathing. Neurologic: No focal deficits.   Lab Results  Component Value Date   CREATININE 0.61 11/05/2019   BUN 16 11/05/2019   NA 138 11/05/2019   K 4.3 11/05/2019   CL 101 11/05/2019   CO2 23 11/05/2019   Lab Results  Component Value Date   ALT 25 07/24/2019   AST 15 07/24/2019   ALKPHOS 59 07/24/2019   BILITOT 0.4 07/24/2019   Lab Results  Component Value Date   HGBA1C 6.9 (H) 11/05/2019   HGBA1C 6.8 (H) 07/24/2019   HGBA1C 6.7 (H) 01/30/2019   HGBA1C 6.4 07/08/2018   HGBA1C 6.2 11/05/2017   No results found for: INSULIN Lab Results  Component Value Date   TSH 2.82 01/30/2019   Lab Results  Component Value Date   CHOL 192 07/24/2019   HDL 49.80 07/24/2019   LDLCALC 124 (H) 07/24/2019   LDLDIRECT 144.2 07/20/2013   TRIG 91.0 07/24/2019   CHOLHDL 4 07/24/2019   Lab Results  Component Value Date   WBC 8.9 11/05/2019  HGB 14.1 11/05/2019   HCT 42.5 11/05/2019   MCV 97.5 11/05/2019   PLT 365 11/05/2019   Lab Results  Component Value Date   FERRITIN 32 03/26/2016   Attestation Statements:   Reviewed by clinician on day of visit: allergies, medications, problem list, medical history, surgical history, family history, social history, and previous encounter notes.  Time spent on visit including pre-visit chart review and post-visit charting and care was 60 minutes.    I, Trixie Dredge, am acting as transcriptionist for Dennard Nip, MD.  I have reviewed the above documentation for accuracy and completeness, and I agree with the above. - Dennard Nip, MD

## 2020-02-25 NOTE — Progress Notes (Unsigned)
Office: [phone]  /  Fax: [phone]    Date: March 02, 2020   Appointment Start Time: *** Duration: *** minutes Provider: Glennie Isle, Psy.D. Type of Session: Intake for Individual Therapy  Location of Patient: {gbptloc:23249} Location of Provider: {Location of Service:22491} Type of Contact: Telepsychological Visit via {gbtelepsych:23399}  Informed Consent: Prior to proceeding with today's appointment, two pieces of identifying information were obtained. In addition, Rachel Norris's physical location at the time of this appointment was obtained as well a phone number she could be reached at in the event of technical difficulties. Rachel Norris and this provider participated in today's telepsychological service.   The provider's role was explained to [employer]. The provider reviewed and discussed issues of confidentiality, privacy, and limits therein (e.g., reporting obligations). In addition to verbal informed consent, written informed consent for psychological services was obtained prior to the initial appointment. Since the clinic is not a 24/7 crisis center, mental health emergency resources were shared and this  provider explained MyChart, e-mail, voicemail, and/or other messaging systems should be utilized only for non-emergency reasons. This provider also explained that information obtained during appointments will be placed in Rachel Norris's medical record and relevant information will be shared with other providers at [facility] for coordination of care. Moreover, Peri agreed information may be shared with other [facility] providers as needed for coordination of care. By signing the service agreement document, Rachel Norris provided written consent for coordination of care. Prior to initiating telepsychological services, Rachel Norris completed an informed consent document, which included the development of a safety plan (i.e., an emergency contact, nearest emergency room, and  emergency resources) in the event of an emergency/crisis. Rachel Norris expressed understanding of the rationale of the safety plan. Rachel Norris verbally acknowledged understanding she is ultimately responsible for understanding her insurance benefits for telepsychological and in-person services. This provider also reviewed confidentiality, as it relates to telepsychological services, as well as the rationale for telepsychological services (i.e., to reduce exposure risk to COVID-19). Rachel Norris  acknowledged understanding that appointments cannot be recorded without both party consent and she is aware she is responsible for securing confidentiality on her end of the session. Rachel Norris verbally consented to proceed.  Chief Complaint/HPI: Rachel Norris was referred by Dr. Dennard Nip due to {Reason for Referrals:22136}. Per the note for the initial visit with Dr. Dennard Nip on February 25, 2020, ***. The note for the initial appointment with Dr. Dennard Nip  indicated the following: "***." Emmarose's Food and Mood (modified PHQ-9) score on February 25, 2020 was 12.  During today's appointment, Rachel Norris was verbally administered a questionnaire assessing various behaviors related to emotional eating. Rachel Norris endorsed the following: {gbmoodandfood:21755}. She shared she craves ***. Rachel Norris believes the onset of emotional eating was *** and described the current frequency of emotional eating as ***. In addition, Rachel Norris {gblegal:22371} a history of binge eating. *** Moreover, Rachel Norris indicated *** triggers emotional eating, whereas *** makes emotional eating better. Furthermore, Rachel Norris {gblegal:22371} other problems of concern. ***   Mental Status Examination:  Appearance: {Appearance:22431} Behavior: {Behavior:22445} Mood: {gbmood:21757} Affect: {Affect:22436} Speech: {Speech:22432} Eye Contact: {Eye Contact:22433} Psychomotor Activity: {Motor Activity:22434} Gait: {gbgait:23404} Thought Process: {thought process:22448}  Thought  Content/Perception: {disturbances:22451} Orientation: {Orientation:22437} Memory/Concentration: {gbcognition:22449} Insight/Judgment: {Insight:22446}  Family & Psychosocial History: Rachel Norris reported she is *** and ***. She indicated she is currently ***. Additionally, Cristalle shared her highest level of education obtained is ***. Currently, Rachel Norris's social support system consists of ***. Moreover, Rachel Norris stated she resides with her ***.  Medical History: ***  Mental Health History: Rachel Norris {Endorse or deny of item:23407} therapeutic services. Rachel Norris {Endorse or deny of item:23407} hospitalizations for psychiatric concerns, and she has never met with a psychiatrist.*** Rachel Norris stated she was *** psychotropic medications. Rachel Norris {gblegal:22371} a family history of mental health related concerns. *** Rachel Norris {Endorse or deny of item:23407} trauma including {gbtrauma:22071} abuse, as well as neglect. ***  Rachel Norris described her typical mood lately as ***. Aside from concerns noted above and endorsed on the PHQ-9 and GAD-7, Rachel Norris reported ***. Rachel Norris {gblegal:22371} current alcohol use. *** She {gblegal:22371} tobacco use. *** She {QQPYPPJ:09326} illicit/recreational substance use. Regarding caffeine intake, Amiri reported ***. Furthermore, Megen indicated she is not experiencing the following: {gbsxs:21965}. She also denied history of and current suicidal ideation, plan, and intent; history of and current homicidal ideation, plan, and intent; and history of and current engagement in self-harm.  The following strengths were reported by Rachel Norris: ***. The following strengths were observed by this provider: {gbstrengths:22223}.  Legal History: Rachel Norris {Endorse or deny of item:23407} legal involvement.   Structured Assessments Results: The Patient Health Questionnaire-9 (PHQ-9) is a self-report measure that assesses symptoms and severity of depression over the course of the last two weeks. Rachel Norris obtained a score of ***  suggesting {GBPHQ9SEVERITY:21752}. Mette finds the endorsed symptoms to be {gbphq9difficulty:21754}. [0= Not at all; 1= Several days; 2= More than half the days; 3= Nearly every day] Little interest or pleasure in doing things ***  Feeling down, depressed, or hopeless ***  Trouble falling or staying asleep, or sleeping too much ***  Feeling tired or having little energy ***  Poor appetite or overeating ***  Feeling bad about yourself --- or that you are a failure or have let yourself or your family down ***  Trouble concentrating on things, such as reading the newspaper or watching television ***  Moving or speaking so slowly that other people could have noticed? Or the opposite --- being so fidgety or restless that you have been moving around a lot more than usual ***  Thoughts that you would be better off dead or hurting yourself in some way ***  PHQ-9 Score ***    The Generalized Anxiety Disorder-7 (GAD-7) is a brief self-report measure that assesses symptoms of anxiety over the course of the last two weeks. Mykaylah obtained a score of *** suggesting {gbgad7severity:21753}. Delisha finds the endorsed symptoms to be {gbphq9difficulty:21754}. [0= Not at all; 1= Several days; 2= Over half the days; 3= Nearly every day] Feeling nervous, anxious, on edge ***  Not being able to stop or control worrying ***  Worrying too much about different things ***  Trouble relaxing ***  Being so restless that it's hard to sit still ***  Becoming easily annoyed or irritable ***  Feeling afraid as if something awful might happen ***  GAD-7 Score ***   Interventions:  {Interventions List for Intake:23406}  Provisional DSM-5 Diagnosis(es): {Diagnoses:22752}  Plan: Aryani appears able and willing to participate as evidenced by collaboration on a treatment goal, engagement in reciprocal conversation, and asking questions as needed for clarification. The next appointment will be scheduled in {gbweeks:21758}, which  will be {gbtxmodality:23402}. The following treatment goal was established: {gbtxgoals:21759}. This provider will regularly review the treatment plan and medical chart to keep informed of status changes. Evangela expressed understanding and agreement with the initial treatment plan of care. *** Nautia will be sent a handout via e-mail to utilize between now and the next appointment to increase awareness of  hunger patterns and subsequent eating. Keyshawna provided verbal consent during today's appointment for this provider to send the handout via e-mail. ***

## 2020-02-26 ENCOUNTER — Encounter (INDEPENDENT_AMBULATORY_CARE_PROVIDER_SITE_OTHER): Payer: Self-pay | Admitting: Family Medicine

## 2020-02-26 ENCOUNTER — Telehealth: Payer: Self-pay

## 2020-02-26 LAB — VITAMIN B12: Vitamin B-12: 684 pg/mL (ref 232–1245)

## 2020-02-26 LAB — CBC WITH DIFFERENTIAL/PLATELET
Basophils Absolute: 0.1 10*3/uL (ref 0.0–0.2)
Basos: 1 %
EOS (ABSOLUTE): 0.2 10*3/uL (ref 0.0–0.4)
Eos: 3 %
Hematocrit: 40 % (ref 34.0–46.6)
Hemoglobin: 14.1 g/dL (ref 11.1–15.9)
Immature Grans (Abs): 0 10*3/uL (ref 0.0–0.1)
Immature Granulocytes: 1 %
Lymphocytes Absolute: 1.4 10*3/uL (ref 0.7–3.1)
Lymphs: 21 %
MCH: 32.4 pg (ref 26.6–33.0)
MCHC: 35.3 g/dL (ref 31.5–35.7)
MCV: 92 fL (ref 79–97)
Monocytes Absolute: 0.7 10*3/uL (ref 0.1–0.9)
Monocytes: 9 %
Neutrophils Absolute: 4.6 10*3/uL (ref 1.4–7.0)
Neutrophils: 65 %
Platelets: 349 10*3/uL (ref 150–450)
RBC: 4.35 x10E6/uL (ref 3.77–5.28)
RDW: 12.7 % (ref 11.7–15.4)
WBC: 6.9 10*3/uL (ref 3.4–10.8)

## 2020-02-26 LAB — COMPREHENSIVE METABOLIC PANEL
ALT: 26 IU/L (ref 0–32)
AST: 17 IU/L (ref 0–40)
Albumin/Globulin Ratio: 1.8 (ref 1.2–2.2)
Albumin: 4.5 g/dL (ref 3.8–4.9)
Alkaline Phosphatase: 89 IU/L (ref 39–117)
BUN/Creatinine Ratio: 20 (ref 9–23)
BUN: 13 mg/dL (ref 6–24)
Bilirubin Total: 0.2 mg/dL (ref 0.0–1.2)
CO2: 22 mmol/L (ref 20–29)
Calcium: 9.9 mg/dL (ref 8.7–10.2)
Chloride: 99 mmol/L (ref 96–106)
Creatinine, Ser: 0.65 mg/dL (ref 0.57–1.00)
GFR calc Af Amer: 114 mL/min/{1.73_m2} (ref >59)
GFR calc non Af Amer: 99 mL/min/{1.73_m2} (ref >59)
Globulin, Total: 2.5 g/dL (ref 1.5–4.5)
Glucose: 110 mg/dL — ABNORMAL HIGH (ref 65–99)
Potassium: 4.2 mmol/L (ref 3.5–5.2)
Sodium: 140 mmol/L (ref 134–144)
Total Protein: 7 g/dL (ref 6.0–8.5)

## 2020-02-26 LAB — MICROALBUMIN / CREATININE URINE RATIO
Creatinine, Urine: 49.9 mg/dL
Microalb/Creat Ratio: 9 mg/g creat (ref 0–29)
Microalbumin, Urine: 4.7 ug/mL

## 2020-02-26 LAB — TSH: TSH: 2.51 u[IU]/mL (ref 0.450–4.500)

## 2020-02-26 LAB — LIPID PANEL WITH LDL/HDL RATIO
Cholesterol, Total: 231 mg/dL — ABNORMAL HIGH (ref 100–199)
HDL: 61 mg/dL (ref >39)
LDL Chol Calc (NIH): 148 mg/dL — ABNORMAL HIGH (ref 0–99)
LDL/HDL Ratio: 2.4 ratio (ref 0.0–3.2)
Triglycerides: 124 mg/dL (ref 0–149)
VLDL Cholesterol Cal: 22 mg/dL (ref 5–40)

## 2020-02-26 LAB — FOLATE: Folate: 14.2 ng/mL (ref >3.0)

## 2020-02-26 LAB — T4, FREE: Free T4: 1.09 ng/dL (ref 0.82–1.77)

## 2020-02-26 LAB — INSULIN, RANDOM: INSULIN: 13.7 u[IU]/mL (ref 2.6–24.9)

## 2020-02-26 LAB — HEMOGLOBIN A1C
Est. average glucose Bld gHb Est-mCnc: 157 mg/dL
Hgb A1c MFr Bld: 7.1 % — ABNORMAL HIGH (ref 4.8–5.6)

## 2020-02-26 LAB — T3: T3, Total: 145 ng/dL (ref 71–180)

## 2020-02-26 LAB — VITAMIN D 25 HYDROXY (VIT D DEFICIENCY, FRACTURES): Vit D, 25-Hydroxy: 25.7 ng/mL — ABNORMAL LOW (ref 30.0–100.0)

## 2020-02-26 MED ORDER — ACCU-CHEK GUIDE VI STRP: ORAL_STRIP | 12 refills | Status: DC

## 2020-02-26 MED ORDER — ACCU-CHEK SOFT TOUCH LANCETS MISC: 12 refills | Status: DC

## 2020-02-26 NOTE — Telephone Encounter (Signed)
Patient called in to see if Dr. Paz could send in a prescription for blood glucose meter kit and supplies [[id number]  &   ACCU-CHEK GUIDE test strip [[id number]]   

## 2020-02-26 NOTE — Telephone Encounter (Signed)
Pt already has glucometer- will send lancets and test strips refill to pharmacy.

## 2020-03-02 ENCOUNTER — Ambulatory Visit (INDEPENDENT_AMBULATORY_CARE_PROVIDER_SITE_OTHER): Payer: BC Managed Care – PPO | Admitting: Psychology

## 2020-03-10 ENCOUNTER — Ambulatory Visit (INDEPENDENT_AMBULATORY_CARE_PROVIDER_SITE_OTHER): Payer: BC Managed Care – PPO | Admitting: Family Medicine

## 2020-03-10 ENCOUNTER — Encounter (INDEPENDENT_AMBULATORY_CARE_PROVIDER_SITE_OTHER): Payer: Self-pay | Admitting: Family Medicine

## 2020-03-10 ENCOUNTER — Other Ambulatory Visit: Payer: Self-pay

## 2020-03-10 VITALS — BP 147/85 | HR 88 | Temp 97.7°F | Ht 63.0 in | Wt 308.0 lb

## 2020-03-10 DIAGNOSIS — Z6841 Body Mass Index (BMI) 40.0 and over, adult: Secondary | ICD-10-CM

## 2020-03-10 DIAGNOSIS — E1165 Type 2 diabetes mellitus with hyperglycemia: Secondary | ICD-10-CM | POA: Diagnosis not present

## 2020-03-10 DIAGNOSIS — Z9189 Other specified personal risk factors, not elsewhere classified: Secondary | ICD-10-CM | POA: Diagnosis not present

## 2020-03-10 DIAGNOSIS — E66813 Obesity, class 3: Secondary | ICD-10-CM

## 2020-03-10 DIAGNOSIS — E559 Vitamin D deficiency, unspecified: Secondary | ICD-10-CM

## 2020-03-10 DIAGNOSIS — E7849 Other hyperlipidemia: Secondary | ICD-10-CM | POA: Diagnosis not present

## 2020-03-10 MED ORDER — VITAMIN D (ERGOCALCIFEROL) 1.25 MG (50000 UNIT) PO CAPS: 50000.0000 [IU] | ORAL_CAPSULE | ORAL | 0 refills | Status: DC

## 2020-03-10 NOTE — Progress Notes (Signed)
Chief Complaint:   OBESITY Rachel Norris is here to discuss her progress with her obesity treatment plan along with follow-up of her obesity related diagnoses. Rachel Norris is on the Category 3 Plan and states she is following her eating plan approximately 99% of the time. Rachel Norris states she is walking and on the elliptical for 10 minutes 1 time per week.  Today's visit was #: 2 Starting weight: 319 lbs Starting date: 02/25/2020 Today's weight: 308 lbs Today's date: 03/10/2020 Total lbs lost to date: 11 Total lbs lost since last in-office visit: 11  Interim History: Rachel Norris did very well with weight loss on her Category 3 plan. Her hunger was controlled and her husband ate healthy with her, which made it easier to follow. She states she feels more energy and less muscle and joint plan already.  Subjective:   1. Pure hyperlipidemia Rachel Norris LDL is elevated, and her HDL and triglycerides are within normal limits. She would like to try to diet control. I discussed labs with the patient today.  2. Vitamin D deficiency Rachel Norris is not on Vit D and she notes fatigue. She agreed to start prescription Vit D.  3. Type 2 diabetes mellitus with hyperglycemia, without long-term current use of insulin (HCC) Rachel Norris A1c is worsening and has increased to 7.1. She is on metformin and her fasting BGs range in 120's. Her 2 hour post prandials mostly range between 120 and 130's on her eating plan over the last 2 weeks. I discussed labs with the patient today.  4. At risk for heart disease Rachel Norris is at a higher than average risk for cardiovascular disease due to obesity. Reviewed: no chest pain on exertion, no dyspnea on exertion, and no swelling of ankles.  Assessment/Plan:   1. Pure hyperlipidemia Cardiovascular risk and specific lipid/LDL goals reviewed. We discussed several lifestyle modifications today and Rachel Norris will continue to work on diet, exercise and weight loss efforts. We will recheck labs in 3 months. Orders  and follow up as documented in patient record.   2. Vitamin D deficiency Low Vitamin D level contributes to fatigue and are associated with obesity, breast, and colon cancer. Rachel Norris agreed to start prescription Vitamin D 50,000 IU every week with no refills. She will follow-up for routine testing of Vitamin D, at least 2-3 times per year to avoid over-replacement.  - Vitamin D, Ergocalciferol, (DRISDOL) 1.25 MG (50000 UNIT) CAPS capsule; Take 1 capsule (50,000 Units total) by mouth every 7 (seven) days.  Dispense: 4 capsule; Refill: 0  3. Type 2 diabetes mellitus with hyperglycemia, without long-term current use of insulin (HCC) Good blood sugar control is important to decrease the likelihood of diabetic complications such as nephropathy, neuropathy, limb loss, blindness, coronary artery disease, and death. Intensive lifestyle modification including diet, exercise and weight loss are the first line of treatment for diabetes. Rachel Norris is to continue metformin and diet, and we will recheck labs in 3 months.  4. At risk for heart disease Rachel Norris was given approximately 15 minutes of coronary artery disease prevention counseling today. She is 59 y.o. female and has risk factors for heart disease including obesity. We discussed intensive lifestyle modifications today with an emphasis on specific weight loss instructions and strategies.   Repetitive spaced learning was employed today to elicit superior memory formation and behavioral change.  5. Class 3 severe obesity with serious comorbidity and body mass index (BMI) of 50.0 to 59.9 in adult, unspecified obesity type (HCC) Rachel Norris is currently in the action  stage of change. As such, her goal is to continue with weight loss efforts. She has agreed to the Category 3 Plan.   Exercise goals: As is.  Behavioral modification strategies: increasing lean protein intake.  Rachel Norris has agreed to follow-up with our clinic in 2 weeks. She was informed of the importance of  frequent follow-up visits to maximize her success with intensive lifestyle modifications for her multiple health conditions.   Objective:   Blood pressure (!) 147/85, pulse 88, temperature 97.7 F (36.5 C), temperature source Oral, height 5\' 3"  (1.6 m), weight (!) 308 lb (139.7 kg), last menstrual period 09/15/2016, SpO2 95 %. Body mass index is 54.56 kg/m.  General: Cooperative, alert, well developed, in no acute distress. HEENT: Conjunctivae and lids unremarkable. Cardiovascular: Regular rhythm.  Lungs: Normal work of breathing. Neurologic: No focal deficits.   Lab Results  Component Value Date   CREATININE 0.65 02/25/2020   BUN 13 02/25/2020   NA 140 02/25/2020   K 4.2 02/25/2020   CL 99 02/25/2020   CO2 22 02/25/2020   Lab Results  Component Value Date   ALT 26 02/25/2020   AST 17 02/25/2020   ALKPHOS 89 02/25/2020   BILITOT 0.2 02/25/2020   Lab Results  Component Value Date   HGBA1C 7.1 (H) 02/25/2020   HGBA1C 6.9 (H) 11/05/2019   HGBA1C 6.8 (H) 07/24/2019   HGBA1C 6.7 (H) 01/30/2019   HGBA1C 6.4 07/08/2018   Lab Results  Component Value Date   INSULIN 13.7 02/25/2020   Lab Results  Component Value Date   TSH 2.510 02/25/2020   Lab Results  Component Value Date   CHOL 231 (H) 02/25/2020   HDL 61 02/25/2020   LDLCALC 148 (H) 02/25/2020   LDLDIRECT 144.2 07/20/2013   TRIG 124 02/25/2020   CHOLHDL 4 07/24/2019   Lab Results  Component Value Date   WBC 6.9 02/25/2020   HGB 14.1 02/25/2020   HCT 40.0 02/25/2020   MCV 92 02/25/2020   PLT 349 02/25/2020   Lab Results  Component Value Date   FERRITIN 32 03/26/2016   Attestation Statements:   Reviewed by clinician on day of visit: allergies, medications, problem list, medical history, surgical history, family history, social history, and previous encounter notes.   I, Trixie Dredge, am acting as transcriptionist for Dennard Nip, MD.  I have reviewed the above documentation for accuracy and  completeness, and I agree with the above. -  Dennard Nip, MD

## 2020-03-24 ENCOUNTER — Ambulatory Visit: Payer: BC Managed Care – PPO | Attending: Internal Medicine

## 2020-03-24 DIAGNOSIS — Z23 Encounter for immunization: Secondary | ICD-10-CM

## 2020-03-24 NOTE — Progress Notes (Signed)
   Covid-19 Vaccination Clinic  Name:  LIISA THURSTON    MRN: [id number] DOB: [birth year]  03/24/2020  Ms. Emory-Heath was observed post Covid-19 immunization for 15 minutes without incident. She was provided with Vaccine Information Sheet and instruction to access the V-Safe system.   Ms. Pretti was instructed to call 911 with any severe reactions post vaccine: Marland Kitchen Difficulty breathing  . Swelling of face and throat  . A fast heartbeat  . A bad rash all over body  . Dizziness and weakness   Immunizations Administered    Name Date Dose VIS Date Route   Pfizer COVID-19 Vaccine 03/24/2020  8:40 AM 0.3 mL 12/11/2019 Intramuscular   Manufacturer: [place]   Lot: [id number]   [place]: [id number]

## 2020-03-29 ENCOUNTER — Other Ambulatory Visit: Payer: Self-pay

## 2020-03-29 ENCOUNTER — Encounter (INDEPENDENT_AMBULATORY_CARE_PROVIDER_SITE_OTHER): Payer: Self-pay | Admitting: Family Medicine

## 2020-03-29 ENCOUNTER — Ambulatory Visit (INDEPENDENT_AMBULATORY_CARE_PROVIDER_SITE_OTHER): Payer: BC Managed Care – PPO | Admitting: Family Medicine

## 2020-03-29 VITALS — BP 127/82 | HR 71 | Temp 97.6°F | Ht 63.0 in | Wt 304.0 lb

## 2020-03-29 DIAGNOSIS — Z9189 Other specified personal risk factors, not elsewhere classified: Secondary | ICD-10-CM

## 2020-03-29 DIAGNOSIS — Z6841 Body Mass Index (BMI) 40.0 and over, adult: Secondary | ICD-10-CM

## 2020-03-29 DIAGNOSIS — E1165 Type 2 diabetes mellitus with hyperglycemia: Secondary | ICD-10-CM

## 2020-03-29 DIAGNOSIS — E559 Vitamin D deficiency, unspecified: Secondary | ICD-10-CM | POA: Diagnosis not present

## 2020-03-29 MED ORDER — METFORMIN HCL 500 MG PO TABS: ORAL_TABLET | ORAL | 0 refills | Status: DC

## 2020-03-29 MED ORDER — VITAMIN D (ERGOCALCIFEROL) 1.25 MG (50000 UNIT) PO CAPS: 50000.0000 [IU] | ORAL_CAPSULE | ORAL | 0 refills | Status: DC

## 2020-03-29 NOTE — Progress Notes (Signed)
Chief Complaint:   Rachel Norris is here to discuss her progress with her obesity treatment plan along with follow-up of her obesity related diagnoses. Rachel Norris is on the Category 3 Plan and states she is following her eating plan approximately 98% of the time. Rachel Norris states she is doing Cubii 15 minutes 3-4 times per week and walking 15 minutes 5 times per week.  Today's visit was #: 3 Starting weight: 319 lbs Starting date: 02/25/2020 Today's weight: 304 lbs Today's date: 03/29/2020 Total lbs lost to date: 15 Total lbs lost since last in-office visit: 4  Interim History: Rachel Norris has been following the plan almost completely. She has been doing a Catering manager of cottage cheese, cinnamon, and pears. She denies hunger. She is really enjoying the quantity of vegetables she's been getting in her plan. At dinner she is getting at least 8 oz of meat.    Subjective:   Vitamin D deficiency. No nausea, vomiting, or muscle weakness. She endorses fatigue. Last Vitamin D 25.7 on 02/25/2020.  Type 2 diabetes mellitus with hyperglycemia, without long-term current use of insulin (Rachel Norris). Fasting blood sugars 119-141 with 2-hour postprandials 104-154. Rachel Norris is on metformin 500 mg PO BID.  Lab Results  Component Value Date   HGBA1C 7.1 (H) 02/25/2020   HGBA1C 6.9 (H) 11/05/2019   HGBA1C 6.8 (H) 07/24/2019   Lab Results  Component Value Date   LDLCALC 148 (H) 02/25/2020   CREATININE 0.65 02/25/2020   Lab Results  Component Value Date   INSULIN 13.7 02/25/2020   At risk for osteoporosis. Rachel Norris is at higher risk of osteopenia and osteoporosis due to Vitamin D deficiency.   Assessment/Plan:   Vitamin D deficiency. Low Vitamin D level contributes to fatigue and are associated with obesity, breast, and colon cancer. She was given a refill on her Vitamin D, Ergocalciferol, (DRISDOL) 1.25 MG (50000 UNIT) CAPS capsule every week #4 with 0 refills and will follow-up for routine testing  of Vitamin D, at least 2-3 times per year to avoid over-replacement.    Type 2 diabetes mellitus with hyperglycemia, without long-term current use of insulin (Rachel Norris). Good blood sugar control is important to decrease the likelihood of diabetic complications such as nephropathy, neuropathy, limb loss, blindness, coronary artery disease, and death. Intensive lifestyle modification including diet, exercise and weight loss are the first line of treatment for diabetes. Rachel Norris will increase metformin to 1,000 mg QAM and 500 mg PO QPM #90 with 0 refills.  At risk for osteoporosis. Rachel Norris was given approximately 15 minutes of osteoporosis prevention counseling today. Rachel Norris is at risk for osteopenia and osteoporosis due to her Vitamin D deficiency. She was encouraged to take her Vitamin D and follow her higher calcium diet and increase strengthening exercise to help strengthen her bones and decrease her risk of osteopenia and osteoporosis.  Repetitive spaced learning was employed today to elicit superior memory formation and behavioral change.  Class 3 severe obesity with serious comorbidity and body mass index (BMI) of 50.0 to 59.9 in adult, unspecified obesity type (Rachel Norris).  Rachel Norris is currently in the action stage of change. As such, her goal is to continue with weight loss efforts. She has agreed to the Category 3 Plan or the Pescatarian plan +300 calories.   Exercise goals: Rachel Norris will continue her current exercise regimen.  Behavioral modification strategies: increasing lean protein intake, increasing vegetables, meal planning and cooking strategies, keeping healthy foods in the home and planning for success.  Rachel Norris  has agreed to follow-up with our clinic in 2 weeks. She was informed of the importance of frequent follow-up visits to maximize her success with intensive lifestyle modifications for her multiple health conditions.   Objective:   Blood pressure 127/82, pulse 71, temperature 97.6 F (36.4 C),  temperature source Oral, height 5\' 3"  (1.6 m), weight (!) 304 lb (137.9 kg), last menstrual period 09/15/2016, SpO2 94 %. Body mass index is 53.85 kg/m.  General: Cooperative, alert, well developed, in no acute distress. HEENT: Conjunctivae and lids unremarkable. Cardiovascular: Regular rhythm.  Lungs: Normal work of breathing. Neurologic: No focal deficits.   Lab Results  Component Value Date   CREATININE 0.65 02/25/2020   BUN 13 02/25/2020   NA 140 02/25/2020   K 4.2 02/25/2020   CL 99 02/25/2020   CO2 22 02/25/2020   Lab Results  Component Value Date   ALT 26 02/25/2020   AST 17 02/25/2020   ALKPHOS 89 02/25/2020   BILITOT 0.2 02/25/2020   Lab Results  Component Value Date   HGBA1C 7.1 (H) 02/25/2020   HGBA1C 6.9 (H) 11/05/2019   HGBA1C 6.8 (H) 07/24/2019   HGBA1C 6.7 (H) 01/30/2019   HGBA1C 6.4 07/08/2018   Lab Results  Component Value Date   INSULIN 13.7 02/25/2020   Lab Results  Component Value Date   TSH 2.510 02/25/2020   Lab Results  Component Value Date   CHOL 231 (H) 02/25/2020   HDL 61 02/25/2020   LDLCALC 148 (H) 02/25/2020   LDLDIRECT 144.2 07/20/2013   TRIG 124 02/25/2020   CHOLHDL 4 07/24/2019   Lab Results  Component Value Date   WBC 6.9 02/25/2020   HGB 14.1 02/25/2020   HCT 40.0 02/25/2020   MCV 92 02/25/2020   PLT 349 02/25/2020   Lab Results  Component Value Date   FERRITIN 32 03/26/2016   Attestation Statements:   Reviewed by clinician on day of visit: allergies, medications, problem list, medical history, surgical history, family history, social history, and previous encounter notes.  I, Michaelene Song, am acting as transcriptionist for Coralie Common, MD   I have reviewed the above documentation for accuracy and completeness, and I agree with the above. - Ilene Qua, MD

## 2020-04-01 ENCOUNTER — Other Ambulatory Visit: Payer: Self-pay | Admitting: Internal Medicine

## 2020-04-02 ENCOUNTER — Other Ambulatory Visit (INDEPENDENT_AMBULATORY_CARE_PROVIDER_SITE_OTHER): Payer: Self-pay | Admitting: Family Medicine

## 2020-04-02 DIAGNOSIS — E559 Vitamin D deficiency, unspecified: Secondary | ICD-10-CM

## 2020-04-04 MED ORDER — LOSARTAN POTASSIUM 100 MG PO TABS: 100.0000 mg | ORAL_TABLET | Freq: Every day | ORAL | 2 refills | Status: DC

## 2020-04-14 ENCOUNTER — Encounter (INDEPENDENT_AMBULATORY_CARE_PROVIDER_SITE_OTHER): Payer: Self-pay | Admitting: Family Medicine

## 2020-04-14 ENCOUNTER — Ambulatory Visit (INDEPENDENT_AMBULATORY_CARE_PROVIDER_SITE_OTHER): Payer: BC Managed Care – PPO | Admitting: Family Medicine

## 2020-04-14 ENCOUNTER — Other Ambulatory Visit: Payer: Self-pay

## 2020-04-14 VITALS — BP 118/79 | HR 79 | Temp 97.7°F | Ht 63.0 in | Wt 297.0 lb

## 2020-04-14 DIAGNOSIS — E7849 Other hyperlipidemia: Secondary | ICD-10-CM

## 2020-04-14 DIAGNOSIS — I10 Essential (primary) hypertension: Secondary | ICD-10-CM | POA: Diagnosis not present

## 2020-04-14 DIAGNOSIS — E559 Vitamin D deficiency, unspecified: Secondary | ICD-10-CM

## 2020-04-14 DIAGNOSIS — Z6841 Body Mass Index (BMI) 40.0 and over, adult: Secondary | ICD-10-CM

## 2020-04-18 ENCOUNTER — Ambulatory Visit: Payer: BC Managed Care – PPO

## 2020-04-18 ENCOUNTER — Ambulatory Visit: Payer: BC Managed Care – PPO | Attending: Internal Medicine

## 2020-04-18 DIAGNOSIS — Z23 Encounter for immunization: Secondary | ICD-10-CM

## 2020-04-18 NOTE — Progress Notes (Signed)
   Covid-19 Vaccination Clinic  Name:  Rachel Norris    MRN: [id number] DOB: Dec 17, 1961  04/18/2020  Rachel Norris was observed post Covid-19 immunization for 15 minutes without incident. She was provided with Vaccine Information Sheet and instruction to access the V-Safe system.   Rachel Norris was instructed to call 911 with any severe reactions post vaccine: Marland Kitchen Difficulty breathing  . Swelling of face and throat  . A fast heartbeat  . A bad rash all over body  . Dizziness and weakness   Immunizations Administered    Name Date Dose VIS Date Route   Pfizer COVID-19 Vaccine 04/18/2020 10:26 AM 0.3 mL 02/24/2019 Intramuscular   Manufacturer: [place]   Lot: [id number]   [place]: [id number]

## 2020-04-18 NOTE — Progress Notes (Signed)
Chief Complaint:   OBESITY Rachel Norris is here to discuss her progress with her obesity treatment plan along with follow-up of her obesity related diagnoses. Rachel Norris is on the Category 3 Plan or the [place] + 300 calories and states she is following her eating plan approximately 98% of the time. Rachel Norris states she is doing 0 minutes 0 times per week.  Today's visit was #: 4 Starting weight: 319 lbs Starting date: 02/25/2020 Today's weight: 297 lbs Today's date: 04/14/2020 Total lbs lost to date: 22 Total lbs lost since last in-office visit: 7  Interim History: Rachel Norris voices she had some questions about slight over indulging in [place] pop. She is doing [place] bars for another snack and crackers or carrots and hummus. She does report some feelings of increased temptation with crackers. Sometimes she admits she occasionally skips certain parts of the meal plan secondary to not being hungry.  Subjective:   1. Essential hypertension Rachel Norris's blood pressure is well controlled today. She denies chest pain, chest pressure, or headaches. She is on hydrochlorothiazide and losartan. Her blood pressure was previously slightly high.  2. Vitamin D deficiency Rachel Norris is on prescription Vit D. She denies nausea, vomiting, or muscle weakness, but she notes fatigue. Last Vit D level was 25.7.  3. Other hyperlipidemia Rachel Norris's 10 years ASCVD risk score is 5.9%. she is not on statin and has a history of diabetes ans hypertension.  Assessment/Plan:   1. Essential hypertension Rachel Norris is working on healthy weight loss and exercise to improve blood pressure control. We will watch for signs of hypotension as she continues her lifestyle modifications. We will follow up on her blood pressure at her next appointment, if controlled then will decrease losartan at next appointment.  2. Vitamin D deficiency Low Vitamin D level contributes to fatigue and are associated with obesity, breast, and colon cancer. Rachel Norris  agreed to continue taking prescription Vitamin D 50,000 IU every week, no refill needed. She will follow-up for routine testing of Vitamin D, at least 2-3 times per year to avoid over-replacement.  3. Other hyperlipidemia Cardiovascular risk and specific lipid/LDL goals reviewed. We discussed several lifestyle modifications today and Rachel Norris will continue to work on diet, exercise and weight loss efforts. We will follow up in 3 months and discuss initiation of modified intensity statin. Orders and follow up as documented in patient record.   Counseling Intensive lifestyle modifications are the first line treatment for this issue. . Dietary changes: Increase soluble fiber. Decrease simple carbohydrates. . Exercise changes: Moderate to vigorous-intensity aerobic activity 150 minutes per week if tolerated. . Lipid-lowering medications: see documented in medical record.  4. Class 3 severe obesity with serious comorbidity and body mass index (BMI) of 50.0 to 59.9 in adult, unspecified obesity type Rachel Norris is currently in the action stage of change. As such, her goal is to continue with weight loss efforts. She has agreed to the Category 3 Plan.   Exercise goals: All adults should avoid inactivity. Some physical activity is better than none, and adults who participate in any amount of physical activity gain some health benefits.  Behavioral modification strategies: increasing lean protein intake, meal planning and cooking strategies, keeping healthy foods in the home and planning for success.  Rachel Norris has agreed to follow-up with our clinic in 2 weeks. She was informed of the importance of frequent follow-up visits to maximize her success with intensive lifestyle modifications for her multiple health conditions.   Objective:   Blood pressure  118/79, pulse 79, temperature 97.7 F (36.5 C), temperature source Oral, height 5\' 3"  (1.6 m), weight 297 lb (134.7 kg), last menstrual period 09/15/2016, SpO2  93 %. Body mass index is 52.61 kg/m.  General: Cooperative, alert, well developed, in no acute distress. HEENT: Conjunctivae and lids unremarkable. Cardiovascular: Regular rhythm.  Lungs: Normal work of breathing. Neurologic: No focal deficits.   Lab Results  Component Value Date   CREATININE 0.65 02/25/2020   BUN 13 02/25/2020   NA 140 02/25/2020   K 4.2 02/25/2020   CL 99 02/25/2020   CO2 22 02/25/2020   Lab Results  Component Value Date   ALT 26 02/25/2020   AST 17 02/25/2020   ALKPHOS 89 02/25/2020   BILITOT 0.2 02/25/2020   Lab Results  Component Value Date   HGBA1C 7.1 (H) 02/25/2020   HGBA1C 6.9 (H) 11/05/2019   HGBA1C 6.8 (H) 07/24/2019   HGBA1C 6.7 (H) 01/30/2019   HGBA1C 6.4 07/08/2018   Lab Results  Component Value Date   INSULIN 13.7 02/25/2020   Lab Results  Component Value Date   TSH 2.510 02/25/2020   Lab Results  Component Value Date   CHOL 231 (H) 02/25/2020   HDL 61 02/25/2020   LDLCALC 148 (H) 02/25/2020   LDLDIRECT 144.2 07/20/2013   TRIG 124 02/25/2020   CHOLHDL 4 07/24/2019   Lab Results  Component Value Date   WBC 6.9 02/25/2020   HGB 14.1 02/25/2020   HCT 40.0 02/25/2020   MCV 92 02/25/2020   PLT 349 02/25/2020   Lab Results  Component Value Date   FERRITIN 32 03/26/2016   Attestation Statements:   Reviewed by clinician on day of visit: allergies, medications, problem list, medical history, surgical history, family history, social history, and previous encounter notes.  Time spent on visit including pre-visit chart review and post-visit care and charting was 25 minutes.    I, Trixie Dredge, am acting as transcriptionist for April Manson, MD.  I have reviewed the above documentation for accuracy and completeness, and I agree with the above. - Ilene Qua, MD

## 2020-04-24 ENCOUNTER — Other Ambulatory Visit (INDEPENDENT_AMBULATORY_CARE_PROVIDER_SITE_OTHER): Payer: Self-pay | Admitting: Family Medicine

## 2020-04-24 DIAGNOSIS — E1165 Type 2 diabetes mellitus with hyperglycemia: Secondary | ICD-10-CM

## 2020-04-24 DIAGNOSIS — E559 Vitamin D deficiency, unspecified: Secondary | ICD-10-CM

## 2020-04-27 ENCOUNTER — Encounter (INDEPENDENT_AMBULATORY_CARE_PROVIDER_SITE_OTHER): Payer: Self-pay | Admitting: Family Medicine

## 2020-04-27 ENCOUNTER — Other Ambulatory Visit: Payer: Self-pay

## 2020-04-27 ENCOUNTER — Ambulatory Visit (INDEPENDENT_AMBULATORY_CARE_PROVIDER_SITE_OTHER): Payer: BC Managed Care – PPO | Admitting: Family Medicine

## 2020-04-27 VITALS — BP 120/79 | HR 72 | Temp 98.2°F | Ht 63.0 in | Wt 293.0 lb

## 2020-04-27 DIAGNOSIS — E559 Vitamin D deficiency, unspecified: Secondary | ICD-10-CM

## 2020-04-27 DIAGNOSIS — E1165 Type 2 diabetes mellitus with hyperglycemia: Secondary | ICD-10-CM

## 2020-04-27 DIAGNOSIS — Z6841 Body Mass Index (BMI) 40.0 and over, adult: Secondary | ICD-10-CM

## 2020-04-27 DIAGNOSIS — Z9189 Other specified personal risk factors, not elsewhere classified: Secondary | ICD-10-CM | POA: Diagnosis not present

## 2020-04-27 MED ORDER — VITAMIN D (ERGOCALCIFEROL) 1.25 MG (50000 UNIT) PO CAPS: 50000.0000 [IU] | ORAL_CAPSULE | ORAL | 0 refills | Status: DC

## 2020-04-28 ENCOUNTER — Encounter (INDEPENDENT_AMBULATORY_CARE_PROVIDER_SITE_OTHER): Payer: Self-pay | Admitting: Family Medicine

## 2020-04-28 DIAGNOSIS — E559 Vitamin D deficiency, unspecified: Secondary | ICD-10-CM | POA: Insufficient documentation

## 2020-04-28 MED ORDER — VITAMIN D (ERGOCALCIFEROL) 1.25 MG (50000 UNIT) PO CAPS: 50000.0000 [IU] | ORAL_CAPSULE | ORAL | 0 refills | Status: DC

## 2020-04-28 NOTE — Progress Notes (Signed)
Chief Complaint:   OBESITY Rachel Norris is here to discuss her progress with her obesity treatment plan along with follow-up of her obesity related diagnoses. Rachel Norris is on the Category 3 Plan and states she is following her eating plan approximately 98-99% of the time. Rachel Norris states she is walking for 15-20 minutes 2-3 times per week.  Today's visit was #: 5 Starting weight: 319 lbs Starting date: 02/25/2020 Today's weight: 293 lbs Today's date: 04/27/2020 Total lbs lost to date: 26 Total lbs lost since last in-office visit: 4  Interim History: Rachel Norris is doing well on the plan and has lost 26 lbs overall. She uses extra calories for wine sometimes. She reports her hunger is controlled.  Subjective:   1. Vitamin D deficiency Rachel Norris's last Vit D level was low at 25.7. she is on high dose Vit D and she notes fatigue.  2. Type 2 diabetes mellitus without complication, unspecified whether long term insulin use (HCC) Rachel Norris's last A1c was mildly elevated at 7.1. She is on metformin 1,000 mg in the AM and 500 mg in the PM.  3. At risk for osteoporosis Rachel Norris is at higher risk of osteopenia and osteoporosis due to Vitamin D deficiency and family history of osteoporosis.   Assessment/Plan:   1. Vitamin D deficiency Low Vitamin D level contributes to fatigue and are associated with obesity, breast, and colon cancer. We will refill prescription Vitamin D for 90 days with no refills. [name] will follow-up for routine testing of Vitamin D, at least 2-3 times per year to avoid over-replacement.  - Vitamin D, Ergocalciferol, (DRISDOL) 1.25 MG (50000 UNIT) CAPS capsule; Take 1 capsule (50,000 Units total) by mouth every 7 (seven) days.  Dispense: 12 capsule; Refill: 0  2. Type 2 diabetes mellitus without complication, unspecified whether long term insulin use ([place]) Huntlee agreed to continue metformin. Good blood sugar control is important to decrease the likelihood of diabetic complications such as  nephropathy, neuropathy, limb loss, blindness, coronary artery disease, and death. Intensive lifestyle modification including diet, exercise and weight loss are the first line of treatment for diabetes.   3. At risk for osteoporosis Rachel Norris was given approximately 15 minutes of osteoporosis prevention counseling today. Rachel Norris is at risk for osteopenia and osteoporosis due to her Vitamin D deficiency. She was encouraged to take her Vitamin D and follow her higher calcium diet and increase strengthening exercise to help strengthen her bones and decrease her risk of osteopenia and osteoporosis.  Repetitive spaced learning was employed today to elicit superior memory formation and behavioral change.  4. Class 3 severe obesity with serious comorbidity and body mass index (BMI) of 50.0 to 59.9 in adult, unspecified obesity type [facility]) Rachel Norris is currently in the action stage of change. As such, her goal is to continue with weight loss efforts. She has agreed to the Category 3 Plan.   Handout given today: Protein Equivalents.  Exercise goals: As is.  Behavioral modification strategies: increasing lean protein intake, better snacking choices, travel eating strategies and planning for success.  Rachel Norris has agreed to follow-up with our clinic in 2 weeks. She was informed of the importance of frequent follow-up visits to maximize her success with intensive lifestyle modifications for her multiple health conditions.   Objective:   Blood pressure 120/79, pulse 72, temperature 98.2 F (36.8 C), temperature source Oral, height 5\' 3"  (1.6 m), weight 293 lb (132.9 kg), last menstrual period 09/15/2016, SpO2 95 %. Body mass index is 51.9 kg/m.  General:  Cooperative, alert, well developed, in no acute distress. HEENT: Conjunctivae and lids unremarkable. Cardiovascular: Regular rhythm.  Lungs: Normal work of breathing. Neurologic: No focal deficits.   Lab Results  Component Value Date   CREATININE 0.65  02/25/2020   BUN 13 02/25/2020   NA 140 02/25/2020   K 4.2 02/25/2020   CL 99 02/25/2020   CO2 22 02/25/2020   Lab Results  Component Value Date   ALT 26 02/25/2020   AST 17 02/25/2020   ALKPHOS 89 02/25/2020   BILITOT 0.2 02/25/2020   Lab Results  Component Value Date   HGBA1C 7.1 (H) 02/25/2020   HGBA1C 6.9 (H) 11/05/2019   HGBA1C 6.8 (H) 07/24/2019   HGBA1C 6.7 (H) 01/30/2019   HGBA1C 6.4 07/08/2018   Lab Results  Component Value Date   INSULIN 13.7 02/25/2020   Lab Results  Component Value Date   TSH 2.510 02/25/2020   Lab Results  Component Value Date   CHOL 231 (H) 02/25/2020   HDL 61 02/25/2020   LDLCALC 148 (H) 02/25/2020   LDLDIRECT 144.2 07/20/2013   TRIG 124 02/25/2020   CHOLHDL 4 07/24/2019   Lab Results  Component Value Date   WBC 6.9 02/25/2020   HGB 14.1 02/25/2020   HCT 40.0 02/25/2020   MCV 92 02/25/2020   PLT 349 02/25/2020   Lab Results  Component Value Date   FERRITIN 32 03/26/2016   Attestation Statements:   Reviewed by clinician on day of visit: allergies, medications, problem list, medical history, surgical history, family history, social history, and previous encounter notes.   Wilhemena Durie, am acting as Location manager for [employer], FNP-C.  I have reviewed the above documentation for accuracy and completeness, and I agree with the above. - Georgianne Fick, FNP

## 2020-05-18 ENCOUNTER — Other Ambulatory Visit: Payer: Self-pay

## 2020-05-18 ENCOUNTER — Ambulatory Visit (INDEPENDENT_AMBULATORY_CARE_PROVIDER_SITE_OTHER): Payer: BC Managed Care – PPO | Admitting: Family Medicine

## 2020-05-18 ENCOUNTER — Encounter (INDEPENDENT_AMBULATORY_CARE_PROVIDER_SITE_OTHER): Payer: Self-pay | Admitting: Family Medicine

## 2020-05-18 VITALS — BP 114/75 | HR 73 | Temp 97.8°F | Ht 63.0 in | Wt 287.0 lb

## 2020-05-18 DIAGNOSIS — E1165 Type 2 diabetes mellitus with hyperglycemia: Secondary | ICD-10-CM | POA: Diagnosis not present

## 2020-05-18 DIAGNOSIS — Z9189 Other specified personal risk factors, not elsewhere classified: Secondary | ICD-10-CM

## 2020-05-18 DIAGNOSIS — E559 Vitamin D deficiency, unspecified: Secondary | ICD-10-CM

## 2020-05-18 DIAGNOSIS — Z6841 Body Mass Index (BMI) 40.0 and over, adult: Secondary | ICD-10-CM

## 2020-05-18 MED ORDER — VITAMIN D (ERGOCALCIFEROL) 1.25 MG (50000 UNIT) PO CAPS: 50000.0000 [IU] | ORAL_CAPSULE | ORAL | 0 refills | Status: DC

## 2020-05-18 MED ORDER — METFORMIN HCL 500 MG PO TABS: ORAL_TABLET | ORAL | 0 refills | Status: DC

## 2020-05-18 NOTE — Progress Notes (Signed)
The 10-year ASCVD risk score Mikey Bussing DC Brooke Bonito., et al., 2013) is: 5.2%   Values used to calculate the score:     Age: 59 years     Sex: Female     Is Non-Hispanic African American: No     Diabetic: Yes     Tobacco smoker: No     Systolic Blood Pressure: 99991111 mmHg     Is BP treated: Yes     HDL Cholesterol: 61 mg/dL     Total Cholesterol: 231 mg/dL

## 2020-05-18 NOTE — Progress Notes (Signed)
Chief Complaint:   OBESITY Rachel Norris is here to discuss her progress with her obesity treatment plan along with follow-up of her obesity related diagnoses. Rachel Norris is on the Category 3 Plan and states she is following her eating plan approximately 99% of the time. Rachel Norris states she is at the gym, on the treadmill and doing strength training for 30-40 minutes 4-5 times per week.  Today's visit was #: 6 Starting weight: 319 lbs Starting date: 02/25/2020 Today's weight: 287 lbs Today's date: 05/18/2020 Total lbs lost to date: 32 Total lbs lost since last in-office visit: 6  Interim History: Rachel Norris joined the gym and has been going 4-5 days per week. She is doing very well on the plan and she has lost 32 lbs overall. She notes hunger after her workouts. She drinks adequate water.  Subjective:   1. Vitamin D deficiency Rachel Norris's last Vit D level was low at 25.7. She is on prescription Vit D.  2. Type 2 diabetes mellitus with hyperglycemia, without long-term current use of insulin (HCC) Rachel Norris's last A1c was mildly elevated at 7.1. She is on metformin 1,000 mg in the morning and 500 mg in the evening. She denies hypoglycemia. Her CBGs was 119 this morning. She notes that her CBGs have improved with weight loss.  Lab Results  Component Value Date   HGBA1C 7.1 (H) 02/25/2020   HGBA1C 6.9 (H) 11/05/2019   HGBA1C 6.8 (H) 07/24/2019   Lab Results  Component Value Date   LDLCALC 148 (H) 02/25/2020   CREATININE 0.65 02/25/2020   Lab Results  Component Value Date   INSULIN 13.7 02/25/2020   3. At risk for heart disease Rachel Norris is at a higher than average risk for cardiovascular disease due to obesity, diabetes mellitus, and hyperlipidemia. ASCVD risk score is 5.2%. She is not on a statin. The 10-year ASCVD risk score Rachel Norris., et al., 2013) is: 5.2%   Values used to calculate the score:     Age: 59 years     Sex: Female     Is Non-Hispanic African American: No     Diabetic: Yes  Tobacco smoker: No     Systolic Blood Pressure: 99991111 mmHg     Is BP treated: Yes     HDL Cholesterol: 61 mg/dL     Total Cholesterol: 231 mg/dL  Assessment/Plan:   1. Vitamin D deficiency Low Vitamin D level contributes to fatigue and are associated with obesity, breast, and colon cancer. We will refill prescription Vitamin D for 1 month. Rachel Norris will follow-up for routine testing of Vitamin D, at least 2-3 times per year to avoid over-replacement.  - Vitamin D, Ergocalciferol, (DRISDOL) 1.25 MG (50000 UNIT) CAPS capsule; Take 1 capsule (50,000 Units total) by mouth every 7 (seven) days.  Dispense: 4 capsule; Refill: 0  2. Type 2 diabetes mellitus with hyperglycemia, without long-term current use of insulin (HCC) Good blood sugar control is important to decrease the likelihood of diabetic complications such as nephropathy, neuropathy, limb loss, blindness, coronary artery disease, and death. Intensive lifestyle modification including diet, exercise and weight loss are the first line of treatment for diabetes. Rachel Norris will continue her medications, and we will refill metformin for 1 month.  - metFORMIN (GLUCOPHAGE) 500 MG tablet; Take 1,000 mg qAM and 500 mg qPM  Dispense: 90 tablet; Refill: 0  3. At risk for heart disease Rachel Norris was given approximately 15 minutes of coronary artery disease prevention counseling today. She is 59 y.o. female  and has risk factors for heart disease including obesity, diabetes mellitus, and hyperlipidemia. We discussed intensive lifestyle modifications today with an emphasis on specific weight loss instructions and strategies.   Repetitive spaced learning was employed today to elicit superior memory formation and behavioral change.  4. Class 3 severe obesity with serious comorbidity and body mass index (BMI) of 50.0 to 59.9 in adult, unspecified obesity type Rachel Norris) Rachel Norris is currently in the action stage of change. As such, her goal is to continue with weight loss  efforts. She has agreed to the Category 3 Plan.   Additional breakfast and lunch options were given. Handout given today: Recipes.  Exercise goals: As is.  Behavioral modification strategies: meal planning and cooking strategies and better snacking choices.  Rachel Norris has agreed to follow-up with our clinic in 3 weeks. She was informed of the importance of frequent follow-up visits to maximize her success with intensive lifestyle modifications for her multiple health conditions.   Objective:   Blood pressure 114/75, pulse 73, temperature 97.8 F (36.6 C), temperature source Oral, height 5\' 3"  (1.6 m), weight 287 lb (130.2 kg), last menstrual period 09/15/2016, SpO2 93 %. Body mass index is 50.84 kg/m.  General: Cooperative, alert, well developed, in no acute distress. HEENT: Conjunctivae and lids unremarkable. Cardiovascular: Regular rhythm.  Lungs: Normal work of breathing. Neurologic: No focal deficits.   Lab Results  Component Value Date   CREATININE 0.65 02/25/2020   BUN 13 02/25/2020   NA 140 02/25/2020   K 4.2 02/25/2020   CL 99 02/25/2020   CO2 22 02/25/2020   Lab Results  Component Value Date   ALT 26 02/25/2020   AST 17 02/25/2020   ALKPHOS 89 02/25/2020   BILITOT 0.2 02/25/2020   Lab Results  Component Value Date   HGBA1C 7.1 (H) 02/25/2020   HGBA1C 6.9 (H) 11/05/2019   HGBA1C 6.8 (H) 07/24/2019   HGBA1C 6.7 (H) 01/30/2019   HGBA1C 6.4 07/08/2018   Lab Results  Component Value Date   INSULIN 13.7 02/25/2020   Lab Results  Component Value Date   TSH 2.510 02/25/2020   Lab Results  Component Value Date   CHOL 231 (H) 02/25/2020   HDL 61 02/25/2020   LDLCALC 148 (H) 02/25/2020   LDLDIRECT 144.2 07/20/2013   TRIG 124 02/25/2020   CHOLHDL 4 07/24/2019   Lab Results  Component Value Date   WBC 6.9 02/25/2020   HGB 14.1 02/25/2020   HCT 40.0 02/25/2020   MCV 92 02/25/2020   PLT 349 02/25/2020   Lab Results  Component Value Date   FERRITIN 32  03/26/2016   Attestation Statements:   Reviewed by clinician on day of visit: allergies, medications, problem list, medical history, surgical history, family history, social history, and previous encounter notes.   Wilhemena Durie, am acting as Location manager for [employer], FNP-C.  I have reviewed the above documentation for accuracy and completeness, and I agree with the above. -  Georgianne Fick, FNP

## 2020-06-08 ENCOUNTER — Ambulatory Visit (INDEPENDENT_AMBULATORY_CARE_PROVIDER_SITE_OTHER): Payer: BC Managed Care – PPO | Admitting: Family Medicine

## 2020-06-08 ENCOUNTER — Encounter (INDEPENDENT_AMBULATORY_CARE_PROVIDER_SITE_OTHER): Payer: Self-pay | Admitting: Family Medicine

## 2020-06-08 ENCOUNTER — Other Ambulatory Visit: Payer: Self-pay

## 2020-06-08 VITALS — BP 131/72 | HR 71 | Temp 98.3°F | Ht 63.0 in | Wt 280.0 lb

## 2020-06-08 DIAGNOSIS — Z9189 Other specified personal risk factors, not elsewhere classified: Secondary | ICD-10-CM

## 2020-06-08 DIAGNOSIS — E559 Vitamin D deficiency, unspecified: Secondary | ICD-10-CM | POA: Diagnosis not present

## 2020-06-08 DIAGNOSIS — E1169 Type 2 diabetes mellitus with other specified complication: Secondary | ICD-10-CM | POA: Diagnosis not present

## 2020-06-08 DIAGNOSIS — Z6841 Body Mass Index (BMI) 40.0 and over, adult: Secondary | ICD-10-CM

## 2020-06-08 DIAGNOSIS — E785 Hyperlipidemia, unspecified: Secondary | ICD-10-CM

## 2020-06-08 MED ORDER — VITAMIN D (ERGOCALCIFEROL) 1.25 MG (50000 UNIT) PO CAPS: 50000.0000 [IU] | ORAL_CAPSULE | ORAL | 0 refills | Status: DC

## 2020-06-08 NOTE — Progress Notes (Signed)
Chief Complaint:   OBESITY Rachel Norris is here to discuss her progress with her obesity treatment plan along with follow-up of her obesity related diagnoses. Rachel Norris is on the Category 3 Plan and states she is following her eating plan approximately 99% of the time. Rachel Norris states she is at the gym for 30-60 minutes 3-4 times per week.  Today's visit was #: 7 Starting weight: 319 lbs Starting date: 02/25/2020 Today's weight: 280 lbs Today's date: 06/08/2020 Total lbs lost to date: 39 Total lbs lost since last in-office visit: 7  Interim History: Rachel Norris continues to stick to the plan nearly 100% of the time. She is very committed to the plan even when she eats out. She feels her hunger is satisfied.  Subjective:   1. Vitamin D deficiency Rachel Norris's last Vit D level was low at 25.7. She is on prescription Vit D.  2. Type 2 diabetes mellitus without complication, without long-term current use of insulin (HCC) Rachel Norris's last A1c was slightly elevated at 7.1. Her CBGs range between 113 and 126. She is on metformin 1,000 mg in the morning and 500 mg in the evening.  Lab Results  Component Value Date   HGBA1C 7.1 (H) 02/25/2020   HGBA1C 6.9 (H) 11/05/2019   HGBA1C 6.8 (H) 07/24/2019   Lab Results  Component Value Date   LDLCALC 148 (H) 02/25/2020   CREATININE 0.65 02/25/2020   Lab Results  Component Value Date   INSULIN 13.7 02/25/2020   3. Other hyperlipidemia Rachel Norris has hyperlipidemia and has been trying to improve her cholesterol levels with intensive lifestyle modification including a low saturated fat diet, exercise and weight loss. Her last LDL was high at 148, and HDL and triglycerides were within normal limits. She is not on statin. She denies any chest pain, claudication or myalgias.  Lab Results  Component Value Date   ALT 26 02/25/2020   AST 17 02/25/2020   ALKPHOS 89 02/25/2020   BILITOT 0.2 02/25/2020   Lab Results  Component Value Date   CHOL 231 (H) 02/25/2020   HDL 61  02/25/2020   LDLCALC 148 (H) 02/25/2020   LDLDIRECT 144.2 07/20/2013   TRIG 124 02/25/2020   CHOLHDL 4 07/24/2019   4. At risk for heart disease Delinda is at a higher than average risk for cardiovascular disease due to obesity, diabetes mellitus, and hyperlipidemia. She is not on statin.  The 10-year ASCVD risk score Mikey Bussing DC Brooke Bonito., et al., 2013) is: 7.2%   Values used to calculate the score:     Age: 43 years     Sex: Female     Is Non-Hispanic African American: No     Diabetic: Yes     Tobacco smoker: No     Systolic Blood Pressure: 481 mmHg     Is BP treated: Yes     HDL Cholesterol: 53 mg/dL     Total Cholesterol: 217 mg/dL  Assessment/Plan:   1. Vitamin D deficiency Low Vitamin D level contributes to fatigue and are associated with obesity, breast, and colon cancer. We will check labs today. We will refill prescription Vitamin D for 1 month. Roselin will follow-up for routine testing of Vitamin D, at least 2-3 times per year to avoid over-replacement.  - VITAMIN D 25 Hydroxy (Vit-D Deficiency, Fractures)  - Vitamin D, Ergocalciferol, (DRISDOL) 1.25 MG (50000 UNIT) CAPS capsule; Take 1 capsule (50,000 Units total) by mouth every 7 (seven) days.  Dispense: 4 capsule; Refill: 0  2. Type  2 diabetes mellitus without complication, without long-term current use of insulin (HCC) Good blood sugar control is important to decrease the likelihood of diabetic complications such as nephropathy, neuropathy, limb loss, blindness, coronary artery disease, and death. Intensive lifestyle modification including diet, exercise and weight loss are the first line of treatment for diabetes. We will check labs today.  - Comprehensive metabolic panel - Hemoglobin A1c - Insulin, random  3. Other hyperlipidemia Cardiovascular risk and specific lipid/LDL goals reviewed. We discussed several lifestyle modifications today and Rachel Norris will continue to work on diet, exercise and weight loss efforts. We will check  labs today. I will recommend statin at next OV.  - Comprehensive metabolic panel - Lipid Panel With LDL/HDL Ratio  4. At risk for heart disease Rachel Norris was given approximately 15 minutes of coronary artery disease prevention counseling today. She is 59 y.o. female and has risk factors for heart disease including obesity. We discussed intensive lifestyle modifications today with an emphasis on specific weight loss instructions and strategies.   Repetitive spaced learning was employed today to elicit superior memory formation and behavioral change.  5. Class 3 severe obesity with serious comorbidity and body mass index (BMI) of 45.0 to 49.9 in adult, unspecified obesity type [facility]) Rachel Norris is currently in the action stage of change. As such, her goal is to continue with weight loss efforts. She has agreed to the Category 3 Plan.   Handout given today: [employer].  Exercise goals: As is.  Behavioral modification strategies: planning for success.  Rachel Norris has agreed to follow-up with our clinic in 3 weeks. She was informed of the importance of frequent follow-up visits to maximize her success with intensive lifestyle modifications for her multiple health conditions.   Rachel Norris was informed we would discuss her lab results at her next visit unless there is a critical issue that needs to be addressed sooner. Rachel Norris agreed to keep her next visit at the agreed upon time to discuss these results.  Objective:   Blood pressure 131/72, pulse 71, temperature 98.3 F (36.8 C), temperature source Oral, height 5\' 3"  (1.6 m), weight 280 lb (127 kg), last menstrual period 09/15/2016, SpO2 96 %. Body mass index is 49.6 kg/m.  General: Cooperative, alert, well developed, in no acute distress. HEENT: Conjunctivae and lids unremarkable. Cardiovascular: Regular rhythm.  Lungs: Normal work of breathing. Neurologic: No focal deficits.   Lab Results  Component Value Date   CREATININE 0.65 02/25/2020   BUN 13  02/25/2020   NA 140 02/25/2020   K 4.2 02/25/2020   CL 99 02/25/2020   CO2 22 02/25/2020   Lab Results  Component Value Date   ALT 26 02/25/2020   AST 17 02/25/2020   ALKPHOS 89 02/25/2020   BILITOT 0.2 02/25/2020   Lab Results  Component Value Date   HGBA1C 7.1 (H) 02/25/2020   HGBA1C 6.9 (H) 11/05/2019   HGBA1C 6.8 (H) 07/24/2019   HGBA1C 6.7 (H) 01/30/2019   HGBA1C 6.4 07/08/2018   Lab Results  Component Value Date   INSULIN 13.7 02/25/2020   Lab Results  Component Value Date   TSH 2.510 02/25/2020   Lab Results  Component Value Date   CHOL 231 (H) 02/25/2020   HDL 61 02/25/2020   LDLCALC 148 (H) 02/25/2020   LDLDIRECT 144.2 07/20/2013   TRIG 124 02/25/2020   CHOLHDL 4 07/24/2019   Lab Results  Component Value Date   WBC 6.9 02/25/2020   HGB 14.1 02/25/2020   HCT 40.0 02/25/2020  MCV 92 02/25/2020   PLT 349 02/25/2020   Lab Results  Component Value Date   FERRITIN 32 03/26/2016   Attestation Statements:   Reviewed by clinician on day of visit: allergies, medications, problem list, medical history, surgical history, family history, social history, and previous encounter notes.   Wilhemena Durie, am acting as Location manager for [employer], FNP-C.  I have reviewed the above documentation for accuracy and completeness, and I agree with the above. -  Georgianne Fick, FNP

## 2020-06-09 ENCOUNTER — Encounter (INDEPENDENT_AMBULATORY_CARE_PROVIDER_SITE_OTHER): Payer: Self-pay | Admitting: Family Medicine

## 2020-06-09 DIAGNOSIS — E1169 Type 2 diabetes mellitus with other specified complication: Secondary | ICD-10-CM | POA: Insufficient documentation

## 2020-06-09 DIAGNOSIS — E785 Hyperlipidemia, unspecified: Secondary | ICD-10-CM | POA: Insufficient documentation

## 2020-06-09 LAB — INSULIN, RANDOM: INSULIN: 8.8 u[IU]/mL (ref 2.6–24.9)

## 2020-06-09 LAB — COMPREHENSIVE METABOLIC PANEL
ALT: 32 IU/L (ref 0–32)
AST: 16 IU/L (ref 0–40)
Albumin/Globulin Ratio: 1.6 (ref 1.2–2.2)
Albumin: 4.3 g/dL (ref 3.8–4.9)
Alkaline Phosphatase: 78 IU/L (ref 48–121)
BUN/Creatinine Ratio: 25 — ABNORMAL HIGH (ref 9–23)
BUN: 16 mg/dL (ref 6–24)
Bilirubin Total: 0.3 mg/dL (ref 0.0–1.2)
CO2: 23 mmol/L (ref 20–29)
Calcium: 9.8 mg/dL (ref 8.7–10.2)
Chloride: 101 mmol/L (ref 96–106)
Creatinine, Ser: 0.65 mg/dL (ref 0.57–1.00)
GFR calc Af Amer: 114 mL/min/{1.73_m2} (ref >59)
GFR calc non Af Amer: 99 mL/min/{1.73_m2} (ref >59)
Globulin, Total: 2.7 g/dL (ref 1.5–4.5)
Glucose: 106 mg/dL — ABNORMAL HIGH (ref 65–99)
Potassium: 4.2 mmol/L (ref 3.5–5.2)
Sodium: 142 mmol/L (ref 134–144)
Total Protein: 7 g/dL (ref 6.0–8.5)

## 2020-06-09 LAB — LIPID PANEL WITH LDL/HDL RATIO
Cholesterol, Total: 217 mg/dL — ABNORMAL HIGH (ref 100–199)
HDL: 53 mg/dL (ref >39)
LDL Chol Calc (NIH): 148 mg/dL — ABNORMAL HIGH (ref 0–99)
LDL/HDL Ratio: 2.8 ratio (ref 0.0–3.2)
Triglycerides: 90 mg/dL (ref 0–149)
VLDL Cholesterol Cal: 16 mg/dL (ref 5–40)

## 2020-06-09 LAB — HEMOGLOBIN A1C
Est. average glucose Bld gHb Est-mCnc: 120 mg/dL
Hgb A1c MFr Bld: 5.8 % — ABNORMAL HIGH (ref 4.8–5.6)

## 2020-06-09 LAB — VITAMIN D 25 HYDROXY (VIT D DEFICIENCY, FRACTURES): Vit D, 25-Hydroxy: 50.3 ng/mL (ref 30.0–100.0)

## 2020-06-30 ENCOUNTER — Other Ambulatory Visit: Payer: Self-pay

## 2020-06-30 ENCOUNTER — Ambulatory Visit (INDEPENDENT_AMBULATORY_CARE_PROVIDER_SITE_OTHER): Payer: BC Managed Care – PPO | Admitting: Family Medicine

## 2020-06-30 ENCOUNTER — Encounter (INDEPENDENT_AMBULATORY_CARE_PROVIDER_SITE_OTHER): Payer: Self-pay | Admitting: Family Medicine

## 2020-06-30 VITALS — BP 121/67 | HR 61 | Temp 97.6°F | Ht 63.0 in | Wt 276.0 lb

## 2020-06-30 DIAGNOSIS — I152 Hypertension secondary to endocrine disorders: Secondary | ICD-10-CM

## 2020-06-30 DIAGNOSIS — E1159 Type 2 diabetes mellitus with other circulatory complications: Secondary | ICD-10-CM

## 2020-06-30 DIAGNOSIS — E559 Vitamin D deficiency, unspecified: Secondary | ICD-10-CM

## 2020-06-30 DIAGNOSIS — Z6841 Body Mass Index (BMI) 40.0 and over, adult: Secondary | ICD-10-CM

## 2020-06-30 DIAGNOSIS — Z9189 Other specified personal risk factors, not elsewhere classified: Secondary | ICD-10-CM | POA: Diagnosis not present

## 2020-06-30 DIAGNOSIS — I10 Essential (primary) hypertension: Secondary | ICD-10-CM

## 2020-06-30 DIAGNOSIS — E1169 Type 2 diabetes mellitus with other specified complication: Secondary | ICD-10-CM

## 2020-06-30 DIAGNOSIS — E785 Hyperlipidemia, unspecified: Secondary | ICD-10-CM

## 2020-06-30 MED ORDER — HYDROCHLOROTHIAZIDE 25 MG PO TABS: 25.0000 mg | ORAL_TABLET | Freq: Every day | ORAL | 0 refills | Status: DC

## 2020-06-30 MED ORDER — METFORMIN HCL 500 MG PO TABS: ORAL_TABLET | ORAL | 0 refills | Status: DC

## 2020-06-30 NOTE — Progress Notes (Signed)
The 10-year ASCVD risk score Mikey Bussing DC Brooke Bonito., et al., 2013) is: 6.2%   Values used to calculate the score:     Age: 59 years     Sex: Female     Is Non-Hispanic African American: No     Diabetic: Yes     Tobacco smoker: No     Systolic Blood Pressure: 125 mmHg     Is BP treated: Yes     HDL Cholesterol: 53 mg/dL     Total Cholesterol: 217 mg/dL

## 2020-07-06 NOTE — Progress Notes (Signed)
Chief Complaint:   OBESITY Rachel Norris is here to discuss her progress with her obesity treatment plan along with follow-up of her obesity related diagnoses. Rachel Norris is on the Category 3 Plan and states she is following her eating plan approximately 100% of the time. Rachel Norris states she is doing strength exercises and cardio for 30 minutes 3-5 times per week.  Today's visit was #: 8 Starting weight: 319 lbs Starting date: 02/25/2020 Today's weight: 276 lbs Today's date: 06/30/2020 Total lbs lost to date: 43 Total lbs lost since last in-office visit: 4  Interim History: Rachel Norris has been eating out more because of a family member who is in the Norris. Overall she is still adhering well to our plan. She started exercising May 1.  Subjective:   1. Type 2 diabetes mellitus without complication, without long-term current use of insulin (HCC) Rachel Norris's A1c is down to 5.8 from 7.1. She is on metformin.  Lab Results  Component Value Date   HGBA1C 5.8 (H) 06/08/2020   HGBA1C 7.1 (H) 02/25/2020   HGBA1C 6.9 (H) 11/05/2019   Lab Results  Component Value Date   LDLCALC 148 (H) 06/08/2020   CREATININE 0.65 06/08/2020   Lab Results  Component Value Date   INSULIN 8.8 06/08/2020   INSULIN 13.7 02/25/2020   2. Other hyperlipidemia Rachel Norris has hyperlipidemia and has been trying to improve her cholesterol levels with intensive lifestyle modification including a low saturated fat diet, exercise and weight loss. She is not on statin. Last LDL was 148, and HDL and triglycerides were at goal. Her 10 year ASCVD risk score is 6.2%. She has diabetes mellitus. She denies any chest pain, claudication or myalgias.  Lab Results  Component Value Date   ALT 32 06/08/2020   AST 16 06/08/2020   ALKPHOS 78 06/08/2020   BILITOT 0.3 06/08/2020   Lab Results  Component Value Date   CHOL 217 (H) 06/08/2020   HDL 53 06/08/2020   LDLCALC 148 (H) 06/08/2020   LDLDIRECT 144.2 07/20/2013   TRIG 90 06/08/2020    CHOLHDL 4 07/24/2019  The 10-year ASCVD risk score Rachel Norris DC Jr., et al., 2013) is: 6.2%   Values used to calculate the score:     Age: 7 years     Sex: Female     Is Non-Hispanic African American: No     Diabetic: Yes     Tobacco smoker: No     Systolic Blood Pressure: 702 mmHg     Is BP treated: Yes     HDL Cholesterol: 53 mg/dL     Total Cholesterol: 217 mg/dL  3. Vitamin D deficiency Rachel Norris's last Vit D level was at goal. She is on weekly prescription Vit D.  4. Essential hypertension Rachel Norris's blood pressure is well controlled with hydrochlorothiazide. Cardiovascular ROS: no chest pain or dyspnea on exertion.  BP Readings from Last 3 Encounters:  06/30/20 121/67  06/08/20 131/72  05/18/20 114/75   Lab Results  Component Value Date   CREATININE 0.65 06/08/2020   CREATININE 0.65 02/25/2020   CREATININE 0.61 11/05/2019   5. At risk for heart disease Rachel Norris is at a higher than average risk for cardiovascular disease due to obesity, hyperlipidemia, and hypertension. She is also not on a statin.  Assessment/Plan:   1. Type 2 diabetes mellitus without complication, without long-term current use of insulin (HCC) Good blood sugar control is important to decrease the likelihood of diabetic complications such as nephropathy, neuropathy, limb loss, blindness, coronary artery  disease, and death. Intensive lifestyle modification including diet, exercise and weight loss are the first line of treatment for diabetes. Akshara will continue metformin, and we will refill for 1 month.  - metFORMIN (GLUCOPHAGE) 500 MG tablet; Take 1,000 mg qAM and 500 mg qPM  Dispense: 90 tablet; Refill: 0  2. Other hyperlipidemia Cardiovascular risk and specific lipid/LDL goals reviewed. We discussed several lifestyle modifications today and Marilynn will continue to work on diet, exercise and weight loss efforts. Rachel Norris will speak with her primary care physician about adding statin. She was advised that statin is  recommended.   3. Vitamin D deficiency Low Vitamin D level contributes to fatigue and are associated with obesity, breast, and colon cancer. Rachel Norris agreed to continue taking prescription Vitamin D 50,000 IU every week and will follow-up for routine testing of Vitamin D, at least 2-3 times per year to avoid over-replacement.  4. Essential hypertension Rachel Norris is working on healthy weight loss and exercise to improve blood pressure control. We will watch for signs of hypotension as she continues her lifestyle modifications. We will refill hydrochlorothiazide for 90 days with no refills.  - hydrochlorothiazide (HYDRODIURIL) 25 MG tablet; Take 1 tablet (25 mg total) by mouth daily.  Dispense: 90 tablet; Refill: 0  5. At risk for heart disease Rachel Norris was given approximately 15 minutes of coronary artery disease prevention counseling today. She is 59 y.o. female and has risk factors for heart disease including obesity. We discussed intensive lifestyle modifications today with an emphasis on specific weight loss instructions and strategies.   Repetitive spaced learning was employed today to elicit superior memory formation and behavioral change.  6. Class 3 severe obesity with serious comorbidity and body mass index (BMI) of 45.0 to 49.9 in adult, unspecified obesity type Rachel Norris) Rachel Norris is currently in the action stage of change. As such, her goal is to continue with weight loss efforts. She has agreed to the Category 3 Plan.   Exercise goals: As is.  Behavioral modification strategies: increasing lean protein intake, decreasing simple carbohydrates and planning for success.  Rachel Norris has agreed to follow-up with our clinic in 2 weeks. She was informed of the importance of frequent follow-up visits to maximize her success with intensive lifestyle modifications for her multiple health conditions.   Objective:   Blood pressure 121/67, pulse 61, temperature 97.6 F (36.4 C), temperature source Oral, height  5\' 3"  (1.6 m), weight 276 lb (125.2 kg), last menstrual period 09/15/2016, SpO2 97 %. Body mass index is 48.89 kg/m.  General: Cooperative, alert, well developed, in no acute distress. HEENT: Conjunctivae and lids unremarkable. Cardiovascular: Regular rhythm.  Lungs: Normal work of breathing. Neurologic: No focal deficits.   Lab Results  Component Value Date   CREATININE 0.65 06/08/2020   BUN 16 06/08/2020   NA 142 06/08/2020   K 4.2 06/08/2020   CL 101 06/08/2020   CO2 23 06/08/2020   Lab Results  Component Value Date   ALT 32 06/08/2020   AST 16 06/08/2020   ALKPHOS 78 06/08/2020   BILITOT 0.3 06/08/2020   Lab Results  Component Value Date   HGBA1C 5.8 (H) 06/08/2020   HGBA1C 7.1 (H) 02/25/2020   HGBA1C 6.9 (H) 11/05/2019   HGBA1C 6.8 (H) 07/24/2019   HGBA1C 6.7 (H) 01/30/2019   Lab Results  Component Value Date   INSULIN 8.8 06/08/2020   INSULIN 13.7 02/25/2020   Lab Results  Component Value Date   TSH 2.510 02/25/2020   Lab Results  Component Value Date   CHOL 217 (H) 06/08/2020   HDL 53 06/08/2020   LDLCALC 148 (H) 06/08/2020   LDLDIRECT 144.2 07/20/2013   TRIG 90 06/08/2020   CHOLHDL 4 07/24/2019   Lab Results  Component Value Date   WBC 6.9 02/25/2020   HGB 14.1 02/25/2020   HCT 40.0 02/25/2020   MCV 92 02/25/2020   PLT 349 02/25/2020   Lab Results  Component Value Date   FERRITIN 32 03/26/2016   Attestation Statements:   Reviewed by clinician on day of visit: allergies, medications, problem list, medical history, surgical history, family history, social history, and previous encounter notes.   Wilhemena Durie, am acting as Location manager for [employer], FNP-C.  I have reviewed the above documentation for accuracy and completeness, and I agree with the above. -  Georgianne Fick, FNP

## 2020-07-14 ENCOUNTER — Encounter (INDEPENDENT_AMBULATORY_CARE_PROVIDER_SITE_OTHER): Payer: Self-pay | Admitting: Family Medicine

## 2020-07-14 ENCOUNTER — Ambulatory Visit (INDEPENDENT_AMBULATORY_CARE_PROVIDER_SITE_OTHER): Payer: BC Managed Care – PPO | Admitting: Family Medicine

## 2020-07-14 ENCOUNTER — Other Ambulatory Visit: Payer: Self-pay

## 2020-07-14 VITALS — BP 119/71 | HR 72 | Temp 98.1°F | Ht 63.0 in | Wt 268.0 lb

## 2020-07-14 DIAGNOSIS — I10 Essential (primary) hypertension: Secondary | ICD-10-CM | POA: Diagnosis not present

## 2020-07-14 DIAGNOSIS — Z6841 Body Mass Index (BMI) 40.0 and over, adult: Secondary | ICD-10-CM

## 2020-07-14 DIAGNOSIS — I152 Hypertension secondary to endocrine disorders: Secondary | ICD-10-CM

## 2020-07-14 DIAGNOSIS — E1169 Type 2 diabetes mellitus with other specified complication: Secondary | ICD-10-CM

## 2020-07-14 DIAGNOSIS — E1159 Type 2 diabetes mellitus with other circulatory complications: Secondary | ICD-10-CM | POA: Diagnosis not present

## 2020-07-20 ENCOUNTER — Encounter (INDEPENDENT_AMBULATORY_CARE_PROVIDER_SITE_OTHER): Payer: Self-pay | Admitting: Family Medicine

## 2020-07-20 NOTE — Progress Notes (Signed)
Chief Complaint:   OBESITY Rachel Norris is here to discuss her progress with her obesity treatment plan along with follow-up of her obesity related diagnoses. Rachel Norris is on the Category 3 Plan Rachel states she is following her eating plan approximately 100% of the time. Rachel Norris states she is doing water aerobics, walking, Rachel at the gym for 60 minutes 5-6 times per week.  Today's visit was #: 9 Starting weight: 319 lbs Starting date: 02/25/2020 Today's weight: 268 lbs Today's date: 07/14/2020 Total lbs lost to date: 51 Total lbs lost since last in-office visit: 8  Interim History: Rachel Norris continues to to a great job wit hour plan. Rachel Norris is being quite active as well as going to the gym. She has lost 51 lbs since 02/25/2020. She snacks on raw vegetables Rachel eats all of the prescribed protein. Her husband is following the plan with her Rachel he has lost 22 lbs.  Subjective:   1. Hypertension associated with type 2 diabetes mellitus ([place]) Rachel Norris's blood pressure has decreased along with weight Norris. It is very well controlled with hydrochlorothiazide Rachel losartan. Cardiovascular ROS: no chest pain or dyspnea on exertion.  BP Readings from Last 3 Encounters:  07/14/20 119/71  06/30/20 121/67  06/08/20 131/72   Lab Results  Component Value Date   CREATININE 0.65 06/08/2020   CREATININE 0.65 02/25/2020   CREATININE 0.61 11/05/2019   2. Type 2 diabetes mellitus with other specified complication, without long-term current use of insulin (HCC) Rachel Norris's diabetes mellitus is well controlled. Her A1c is down from 7.1 to 5.8. She is on metformin only.  Lab Results  Component Value Date   HGBA1C 5.8 (H) 06/08/2020   HGBA1C 7.1 (H) 02/25/2020   HGBA1C 6.9 (H) 11/05/2019   Lab Results  Component Value Date   LDLCALC 148 (H) 06/08/2020   CREATININE 0.65 06/08/2020   Lab Results  Component Value Date   INSULIN 8.8 06/08/2020   INSULIN 13.7 02/25/2020   Assessment/Plan:   1. Hypertension  associated with type 2 diabetes mellitus ([place]) Rachel Norris will continue her medications, Rachel will continue working on healthy weight Norris Rachel Rachel to improve blood pressure control. We will watch for signs of hypotension as she continues her lifestyle modifications.  2. Type 2 diabetes mellitus with other specified complication, without long-term current use of insulin (HCC) Rachel Norris, Rachel Norris, Rachel Norris, Rachel Norris, Rachel Norris, Rachel Norris, Rachel Norris will continue metformin, Rachel will follow up as directed.  3. Class 3 severe obesity with serious comorbidity Rachel body mass index (BMI) of 45.0 to 49.9 in adult, unspecified obesity type [facility]) Rachel Norris is currently in the action stage of change. As such, her goal is to continue with weight Norris efforts. She has agreed to the Category 3 Plan.   Rachel goals: As is.  Behavioral modification strategies: better snacking choices Rachel planning for success.  Rachel Norris has agreed to follow-up with our clinic in 3 weeks. She was informed of the importance of frequent follow-up visits to maximize her success with intensive lifestyle modifications for her multiple health conditions.   Objective:   Blood pressure 119/71, pulse 72, temperature 98.1 F (36.7 C), temperature source Oral, height 5\' 3"  (1.6 m), weight 268 lb (121.6 kg), last menstrual period 09/15/2016, SpO2 94 %. Body mass index is 47.47 kg/m.  General: Cooperative, alert, well developed, in no acute distress. HEENT: Conjunctivae Rachel lids unremarkable. Cardiovascular: Regular rhythm.  Lungs: Normal work of breathing. Neurologic: No focal deficits.   Lab Results  Component Value Date   CREATININE 0.65 06/08/2020   BUN 16 06/08/2020   NA 142 06/08/2020   K 4.2  06/08/2020   CL 101 06/08/2020   CO2 23 06/08/2020   Lab Results  Component Value Date   ALT 32 06/08/2020   AST 16 06/08/2020   ALKPHOS 78 06/08/2020   BILITOT 0.3 06/08/2020   Lab Results  Component Value Date   HGBA1C 5.8 (H) 06/08/2020   HGBA1C 7.1 (H) 02/25/2020   HGBA1C 6.9 (H) 11/05/2019   HGBA1C 6.8 (H) 07/24/2019   HGBA1C 6.7 (H) 01/30/2019   Lab Results  Component Value Date   INSULIN 8.8 06/08/2020   INSULIN 13.7 02/25/2020   Lab Results  Component Value Date   TSH 2.510 02/25/2020   Lab Results  Component Value Date   CHOL 217 (H) 06/08/2020   HDL 53 06/08/2020   LDLCALC 148 (H) 06/08/2020   LDLDIRECT 144.2 07/20/2013   TRIG 90 06/08/2020   CHOLHDL 4 07/24/2019   Lab Results  Component Value Date   WBC 6.9 02/25/2020   HGB 14.1 02/25/2020   HCT 40.0 02/25/2020   MCV 92 02/25/2020   PLT 349 02/25/2020   Lab Results  Component Value Date   FERRITIN 32 03/26/2016   Attestation Statements:   Reviewed by clinician on day of visit: allergies, medications, problem list, medical history, surgical history, family history, social history, Rachel previous encounter notes.   Wilhemena Durie, am acting as Location manager for [employer], FNP-C.  I have reviewed the above documentation for accuracy Rachel completeness, Rachel I agree with the above. -  Georgianne Fick, FNP

## 2020-07-25 NOTE — Patient Instructions (Addendum)
Blood work was ordered.    All other Health Maintenance issues reviewed.   All recommended immunizations and age-appropriate screenings are up-to-date or discussed.  Shingles immunization administered today.   Next one due in 2-6 months.   Medications reviewed and updated.  Changes include :   none  Your prescription(s) have been submitted to your pharmacy. Please take as directed and contact our office if you believe you are having problem(s) with the medication(s).    Please followup in 6 months    Health Maintenance, Female Adopting a healthy lifestyle and getting preventive care are important in promoting health and wellness. Ask your health care provider about:  The right schedule for you to have regular tests and exams.  Things you can do on your own to prevent diseases and keep yourself healthy. What should I know about diet, weight, and exercise? Eat a healthy diet   Eat a diet that includes plenty of vegetables, fruits, low-fat dairy products, and lean protein.  Do not eat a lot of foods that are high in solid fats, added sugars, or sodium. Maintain a healthy weight Body mass index (BMI) is used to identify weight problems. It estimates body fat based on height and weight. Your health care provider can help determine your BMI and help you achieve or maintain a healthy weight. Get regular exercise Get regular exercise. This is one of the most important things you can do for your health. Most adults should:  Exercise for at least 150 minutes each week. The exercise should increase your heart rate and make you sweat (moderate-intensity exercise).  Do strengthening exercises at least twice a week. This is in addition to the moderate-intensity exercise.  Spend less time sitting. Even light physical activity can be beneficial. Watch cholesterol and blood lipids Have your blood tested for lipids and cholesterol at 58 years of age, then have this test every 5 years. Have  your cholesterol levels checked more often if:  Your lipid or cholesterol levels are high.  You are older than 58 years of age.  You are at high risk for heart disease. What should I know about cancer screening? Depending on your health history and family history, you may need to have cancer screening at various ages. This may include screening for:  Breast cancer.  Cervical cancer.  Colorectal cancer.  Skin cancer.  Lung cancer. What should I know about heart disease, diabetes, and high blood pressure? Blood pressure and heart disease  High blood pressure causes heart disease and increases the risk of stroke. This is more likely to develop in people who have high blood pressure readings, are of African descent, or are overweight.  Have your blood pressure checked: ? Every 3-5 years if you are 66-66 years of age. ? Every year if you are 27 years old or older. Diabetes Have regular diabetes screenings. This checks your fasting blood sugar level. Have the screening done:  Once every three years after age 29 if you are at a normal weight and have a low risk for diabetes.  More often and at a younger age if you are overweight or have a high risk for diabetes. What should I know about preventing infection? Hepatitis B If you have a higher risk for hepatitis B, you should be screened for this virus. Talk with your health care provider to find out if you are at risk for hepatitis B infection. Hepatitis C Testing is recommended for:  Everyone born from [birth year] through 1964.  Anyone with known risk factors for hepatitis C. Sexually transmitted infections (STIs)  Get screened for STIs, including gonorrhea and chlamydia, if: ? You are sexually active and are younger than 58 years of age. ? You are older than 58 years of age and your health care provider tells you that you are at risk for this type of infection. ? Your sexual activity has changed since you were last screened, and you  are at increased risk for chlamydia or gonorrhea. Ask your health care provider if you are at risk.  Ask your health care provider about whether you are at high risk for HIV. Your health care provider may recommend a prescription medicine to help prevent HIV infection. If you choose to take medicine to prevent HIV, you should first get tested for HIV. You should then be tested every 3 months for as long as you are taking the medicine. Pregnancy  If you are about to stop having your period (premenopausal) and you may become pregnant, seek counseling before you get pregnant.  Take 400 to 800 micrograms (mcg) of folic acid every day if you become pregnant.  Ask for birth control (contraception) if you want to prevent pregnancy. Osteoporosis and menopause Osteoporosis is a disease in which the bones lose minerals and strength with aging. This can result in bone fractures. If you are 34 years old or older, or if you are at risk for osteoporosis and fractures, ask your health care provider if you should:  Be screened for bone loss.  Take a calcium or vitamin D supplement to lower your risk of fractures.  Be given hormone replacement therapy (HRT) to treat symptoms of menopause. Follow these instructions at home: Lifestyle  Do not use any products that contain nicotine or tobacco, such as cigarettes, e-cigarettes, and chewing tobacco. If you need help quitting, ask your health care provider.  Do not use street drugs.  Do not share needles.  Ask your health care provider for help if you need support or information about quitting drugs. Alcohol use  Do not drink alcohol if: ? Your health care provider tells you not to drink. ? You are pregnant, may be pregnant, or are planning to become pregnant.  If you drink alcohol: ? Limit how much you use to 0-1 drink a day. ? Limit intake if you are breastfeeding.  Be aware of how much alcohol is in your drink. In the [place] one drink equals one 12  oz bottle of beer (355 mL), one 5 oz glass of wine (148 mL), or one 1 oz glass of hard liquor (44 mL). General instructions  Schedule regular health, dental, and eye exams.  Stay current with your vaccines.  Tell your health care provider if: ? You often feel depressed. ? You have ever been abused or do not feel safe at home. Summary  Adopting a healthy lifestyle and getting preventive care are important in promoting health and wellness.  Follow your health care provider's instructions about healthy diet, exercising, and getting tested or screened for diseases.  Follow your health care provider's instructions on monitoring your cholesterol and blood pressure. This information is not intended to replace advice given to you by your health care provider. Make sure you discuss any questions you have with your health care provider. Document Revised: 12/10/2018 Document Reviewed: 12/10/2018 Elsevier Patient Education  2020 [employer].

## 2020-07-25 NOTE — Progress Notes (Signed)
Subjective:    Patient ID: Rachel Norris, female    DOB: December 16, 1961, 59 y.o.   MRN: [id number]  HPI She is here for a physical exam.  She is here to establish with a new physician.  I currently see her husband.  She has no concerns.  Overall she feels good and feels better than she has in the past.  She is actively working on weight loss and has been successful.  Medications and allergies reviewed with patient and updated if appropriate.  Patient Active Problem List   Diagnosis Date Noted  . SCC (squamous cell carcinoma) 07/26/2020  . Knee osteoarthritis 07/26/2020  . Hyperlipidemia associated with type 2 diabetes mellitus ([place]) 06/09/2020  . Vitamin D deficiency 04/28/2020  . Acute medial meniscus tear of left knee 11/09/2019  . Acute lateral meniscus tear of left knee 11/09/2019  . Synovial plica of knee, left 30/06/6225  . Chondromalacia, left knee 11/09/2019  . Seasonal and perennial allergic rhinitis 08/01/2018  . Diabetes ([place]) 07/27/2013  . Class 3 severe obesity with serious comorbidity and body mass index (BMI) of 45.0 to 49.9 in adult ([place]) 08/29/2009  . Asthma 06/22/2008  . Hypertension associated with type 2 diabetes mellitus ([place]) 03/07/2007    Current Outpatient Medications on File Prior to Visit  Medication Sig Dispense Refill  . acetaminophen (TYLENOL) 650 MG CR tablet Take 1,300 mg by mouth every 8 (eight) hours as needed for pain.    Marland Kitchen albuterol (VENTOLIN HFA) 108 (90 Base) MCG/ACT inhaler Inhale 2 puffs into the lungs every 4 (four) hours as needed for wheezing or shortness of breath.     . diphenhydrAMINE HCl, Sleep, (UNISOM SLEEPGELS) 50 MG CAPS Take 50 mg by mouth at bedtime as needed (sleep).    . fexofenadine (ALLEGRA) 180 MG tablet Take 180 mg by mouth daily as needed for allergies or rhinitis.    . fluticasone (FLONASE) 50 MCG/ACT nasal spray Place 2 sprays into both nostrils daily. (Patient taking differently: Place 2 sprays into both nostrils  daily as needed for allergies. ) 16 g 0  . glucose blood (ACCU-CHEK GUIDE) test strip USE TO CHECK BLOOD SUGAR DAILY 100 strip 12  . Lancets (ACCU-CHEK SOFT TOUCH) lancets CHECK BLOOD SUGAR ONCE DAILY 100 each 12  . Menthol, Topical Analgesic, (BIOFREEZE EX) Apply 1 application topically daily as needed (knee pain).    . Multiple Vitamins-Minerals (HAIR SKIN AND NAILS FORMULA PO) Take by mouth.    Marland Kitchen Specialty Vitamins Products (VITAMINS FOR HAIR) TABS Take by mouth daily. [facility]    . Vitamin D, Ergocalciferol, (DRISDOL) 1.25 MG (50000 UNIT) CAPS capsule Take 1 capsule (50,000 Units total) by mouth every 7 (seven) days. 4 capsule 0   No current facility-administered medications on file prior to visit.    Past Medical History:  Diagnosis Date  . Allergic rhinitis   . Ankle swelling   . Anxiety   . Anxiety and depression   . Arthritis    generalized arthritis -knees, feet.back.   . 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 At risk for sleep apnea    STOP-BANG= 5            SENT TO PCP 04-05-2015  . Back pain   . Bronchitis    Bronchitis a few months ago- no issues now. - no Inhalers used daily.  Tennis Must Quervain's tenosynovitis, right   . Depression   . Diabetes mellitus without complication ([place])    "  pre diabetes"  . Edema, lower extremity   . Fibroid   . GERD (gastroesophageal reflux disease)   . Heart palpitations   . Hormone disorder   . Hypertension   . Irregular menstrual bleeding   . Joint pain   . Labial cyst    bilateral icclusion  . Left knee pain   . Obesity   . Palpitations   . Perimenopausal   . Plantar fasciitis, bilateral   . PONV (postoperative nausea and vomiting)   . Pre-diabetes   . Prediabetes   . Shortness of breath dyspnea    with exertion, climbing a flight of stairs  . Tendonitis, Achilles, left   . Thickened endometrium   . Wears glasses     Past Surgical History:  Procedure Laterality Date  . COLONOSCOPY WITH PROPOFOL N/A  05/15/2016   Procedure: COLONOSCOPY WITH PROPOFOL;  Surgeon: Mauri Pole, MD;  Location: [facility];  Service: Endoscopy;  Laterality: N/A;  . DILATATION & CURETTAGE/HYSTEROSCOPY WITH MYOSURE N/A 08/30/2016   Procedure: DILATATION & CURETTAGE/HYSTEROSCOPY WITH MYOSURE;  Surgeon: Salvadore Dom, MD;  Location: [facility];  Service: Gynecology;  Laterality: N/A;  hysteroscopic myomectomy. BMI 53.8  . DILATION AND CURETTAGE OF UTERUS    . EAR CYST EXCISION Bilateral 04/08/2015   Procedure: removal of bilateral labial inclusion cysts;  Surgeon: Linda Hedges, DO;  Location: [facility];  Service: Gynecology;  Laterality: Bilateral;  . HYSTEROSCOPY    . HYSTEROSCOPY WITH D & C N/A 04/08/2015   Procedure: DILATATION AND CURETTAGE /HYSTEROSCOPY, removal of bilateral labial inclusion cysts;  Surgeon: Linda Hedges, DO;  Location: [facility];  Service: Gynecology;  Laterality: N/A;  . KNEE ARTHROSCOPY WITH MEDIAL MENISECTOMY Left 11/09/2019   Procedure: ARTHROSCOPY KNEE WITH PARTIAL MEDIAL MENISECTOMY, PARTIAL LATERAL MENISECTOMY, MEDIAL CHONDROPLASTY, MEDIAL PLICA EXCISION AND PATELLA FEMORAL CHONDROPLASTY;  Surgeon: Dorna Leitz, MD;  Location: [facility];  Service: Orthopedics;  Laterality: Left;  . LASER ABLATION OF THE CERVIX  1992   DYSPLAGIA  . MYOMECTOMY N/A 08/30/2016   Procedure: HYSTEROSCOPIC MYOMECTOMY;  Surgeon: Salvadore Dom, MD;  Location: [facility];  Service: Gynecology;  Laterality: N/A;  . 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 Marital status: Married    Spouse name: Not on file  . Number of children: 0  . Years of education: Not on file  . Highest education level: Not on file  Occupational History  . 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 Smoking status: Former Smoker    Packs/day: 1.00    Years: 20.00    Pack years: 20.00    Types: Cigarettes    Quit date: 04/04/1996    Years since quitting: 24.3  . Smokeless tobacco: Never Used  Vaping Use  . Vaping Use: Never used  Substance and Sexual Activity  . Alcohol use: Yes    Alcohol/week: 0.0 standard drinks    Comment: rarely  . Drug use: No  . Sexual activity: Not Currently    Partners: Male    Birth control/protection: Post-menopausal  Other Topics Concern  . Not on file  Social History Narrative   Lives w/ husband    Retired 10-2019   Social Determinants of Radio broadcast assistant Strain:   .  Difficulty of Paying Living Expenses:   Food Insecurity:   . Worried About Charity fundraiser in the Last Year:   . Arboriculturist in the Last Year:   Transportation Needs:   . Film/video editor (Medical):   Marland Kitchen Lack of Transportation (Non-Medical):   Physical Activity:   . Days of Exercise per Week:   . Minutes of Exercise per Session:   Stress:   . Feeling of Stress :   Social Connections:   . Frequency of Communication with Friends and Family:   . Frequency of Social Gatherings with Friends and Family:   . Attends Religious Services:   . Active Member of Clubs or Organizations:   . Attends Archivist Meetings:   Marland Kitchen Marital Status:     Family History  Problem Relation Age of Onset  . Diabetes Mother        M anmd others  . Dementia Mother   . Stroke Mother   . Anxiety disorder Mother   . Stroke Father   . AAA (abdominal aortic aneurysm) Father   . Hypertension Father   . Hyperlipidemia Father   . Heart disease Father   . Atrial fibrillation Sister   . Asthma Sister   . Allergic rhinitis Sister   . Bronchitis Sister   . Angioedema Sister   . Allergic rhinitis Brother   . Asthma Other   . Allergic rhinitis Sister   . Bronchitis Sister   . Colon cancer Neg Hx   . Breast cancer Neg Hx   . Coronary artery disease Neg Hx    . Eczema Neg Hx   . Urticaria Neg Hx   . Immunodeficiency Neg Hx     Review of Systems  Constitutional: Negative for chills and fever.  Respiratory: Negative for cough, shortness of breath and wheezing.   Cardiovascular: Positive for leg swelling. Negative for chest pain and palpitations.  Gastrointestinal: Negative for abdominal pain, blood in stool, constipation, diarrhea and nausea.  Genitourinary: Negative for dysuria and hematuria.  Musculoskeletal: Positive for arthralgias (b/l knees, ) and back pain.  Skin: Positive for rash (on fingers - derm gave her hydrocortisone).  Neurological: Positive for dizziness (rare). Negative for light-headedness and headaches.  Psychiatric/Behavioral: Negative for dysphoric mood. The patient is not nervous/anxious.        Objective:   Vitals:   07/26/20 0920  BP: 122/70  Pulse: 69  Temp: 98 F (36.7 C)  SpO2: 98%   Filed Weights   07/26/20 0920  Weight: (!) 273 lb (123.8 kg)   Body mass index is 48.36 kg/m.  BP Readings from Last 3 Encounters:  07/26/20 122/70  07/14/20 119/71  06/30/20 121/67    Wt Readings from Last 3 Encounters:  07/26/20 (!) 273 lb (123.8 kg)  07/14/20 268 lb (121.6 kg)  06/30/20 276 lb (125.2 kg)     Physical Exam Constitutional: She appears well-developed and well-nourished. No distress.  HENT:  Head: Normocephalic and atraumatic.  Right Ear: External ear normal. Normal ear canal and TM Left Ear: External ear normal.  Normal ear canal and TM Mouth/Throat: Oropharynx is clear and moist.  Eyes: Conjunctivae and EOM are normal.  Neck: Neck supple. No tracheal deviation present. No thyromegaly present.  No carotid bruit  Cardiovascular: Normal rate, regular rhythm and normal heart sounds.   No murmur heard.  No edema. Pulmonary/Chest: Effort normal and breath sounds normal. No respiratory distress. She has no wheezes. She has no rales.  Breast: deferred   Abdominal: Soft. She exhibits no  distension. There is no tenderness.  Palpable subcutaneous lump-either lipoma versus scar tissue from previous injections-nontender. Lymphadenopathy: She has no cervical adenopathy.  Skin: Skin is warm and dry. She is not diaphoretic.  Psychiatric: She has a normal mood and affect. Her behavior is normal.    Diabetic Foot Exam - Simple   Simple Foot Form Diabetic Foot exam was performed with the following findings: Yes 07/26/2020 12:50 PM  Visual Inspection No deformities, no ulcerations, no other skin breakdown bilaterally: Yes Sensation Testing Intact to touch and monofilament testing bilaterally: Yes Pulse Check Posterior Tibialis and Dorsalis pulse intact bilaterally: Yes Comments      The 10-year ASCVD risk score [name], et al., 2013) is: 6.3%   Values used to calculate the score:     Age: 64 years     Sex: Female     Is Non-Hispanic African American: No     Diabetic: Yes     Tobacco smoker: No     Systolic Blood Pressure: 027 mmHg     Is BP treated: Yes     HDL Cholesterol: 53 mg/dL     Total Cholesterol: 217 mg/dL      Assessment & Plan:   Physical exam: Screening blood work    ordered Immunizations  Discussed shingrix - #1 today Colonoscopy  Up to date  Mammogram  Up to date  Gyn  Up to date  Dexa  N/a - consider at 36 Eye exams  Up to date- goes again next month Exercise  Regular - gym - circuit, some walking Weight  Working on weight loss - follows with weight clinic Substance abuse none  See Problem List for Assessment and Plan of chronic medical problems.   This visit occurred during the SARS-CoV-2 public health emergency.  Safety protocols were in place, including screening questions prior to the visit, additional usage of staff PPE, and extensive cleaning of exam room while observing appropriate contact time as indicated for disinfecting solutions.

## 2020-07-26 ENCOUNTER — Ambulatory Visit: Payer: BC Managed Care – PPO | Admitting: Internal Medicine

## 2020-07-26 ENCOUNTER — Other Ambulatory Visit: Payer: Self-pay

## 2020-07-26 ENCOUNTER — Encounter: Payer: Self-pay | Admitting: Internal Medicine

## 2020-07-26 VITALS — BP 122/70 | HR 69 | Temp 98.0°F | Ht 63.0 in | Wt 273.0 lb

## 2020-07-26 DIAGNOSIS — I152 Hypertension secondary to endocrine disorders: Secondary | ICD-10-CM

## 2020-07-26 DIAGNOSIS — Z0000 Encounter for general adult medical examination without abnormal findings: Secondary | ICD-10-CM

## 2020-07-26 DIAGNOSIS — J302 Other seasonal allergic rhinitis: Secondary | ICD-10-CM

## 2020-07-26 DIAGNOSIS — J3089 Other allergic rhinitis: Secondary | ICD-10-CM

## 2020-07-26 DIAGNOSIS — M170 Bilateral primary osteoarthritis of knee: Secondary | ICD-10-CM

## 2020-07-26 DIAGNOSIS — C4492 Squamous cell carcinoma of skin, unspecified: Secondary | ICD-10-CM

## 2020-07-26 DIAGNOSIS — E785 Hyperlipidemia, unspecified: Secondary | ICD-10-CM

## 2020-07-26 DIAGNOSIS — I10 Essential (primary) hypertension: Secondary | ICD-10-CM

## 2020-07-26 DIAGNOSIS — E1159 Type 2 diabetes mellitus with other circulatory complications: Secondary | ICD-10-CM | POA: Diagnosis not present

## 2020-07-26 DIAGNOSIS — E1169 Type 2 diabetes mellitus with other specified complication: Secondary | ICD-10-CM | POA: Diagnosis not present

## 2020-07-26 DIAGNOSIS — Z23 Encounter for immunization: Secondary | ICD-10-CM

## 2020-07-26 DIAGNOSIS — Z85828 Personal history of other malignant neoplasm of skin: Secondary | ICD-10-CM | POA: Insufficient documentation

## 2020-07-26 DIAGNOSIS — J4520 Mild intermittent asthma, uncomplicated: Secondary | ICD-10-CM

## 2020-07-26 DIAGNOSIS — M179 Osteoarthritis of knee, unspecified: Secondary | ICD-10-CM | POA: Insufficient documentation

## 2020-07-26 DIAGNOSIS — Z6841 Body Mass Index (BMI) 40.0 and over, adult: Secondary | ICD-10-CM

## 2020-07-26 MED ORDER — LOSARTAN POTASSIUM 100 MG PO TABS: 100.0000 mg | ORAL_TABLET | Freq: Every day | ORAL | 2 refills | Status: DC

## 2020-07-26 MED ORDER — HYDROCHLOROTHIAZIDE 25 MG PO TABS: 25.0000 mg | ORAL_TABLET | Freq: Every day | ORAL | 2 refills | Status: DC

## 2020-07-26 MED ORDER — OMEPRAZOLE 40 MG PO CPDR: 40.0000 mg | DELAYED_RELEASE_CAPSULE | Freq: Every day | ORAL | 3 refills | Status: DC | PRN

## 2020-07-26 MED ORDER — METFORMIN HCL 500 MG PO TABS: ORAL_TABLET | ORAL | 1 refills | Status: DC

## 2020-07-26 NOTE — Assessment & Plan Note (Signed)
seasonal Takes allergy medications spring

## 2020-07-26 NOTE — Assessment & Plan Note (Signed)
Chronic LDL is elevated, but ASCVD risk is still low at 6.3% Clinically she is in the prediabetic range so we will hold off on initiating statin She is doing great with exercise, eating healthy and weight loss and will continue these efforts and we will monitor for now Recheck lipids today

## 2020-07-26 NOTE — Assessment & Plan Note (Signed)
Chronic Weight has improved following with the healthy weight management clinic She is exercising regularly, has decreased her portions and is eating much healthier and has been successful with weight loss She will continue these efforts

## 2020-07-26 NOTE — Assessment & Plan Note (Signed)
Chronic Well controlled Taking Metformin-3 pills daily-we will continue to help with weight loss and keep sugars well controlled Recheck A1c today

## 2020-07-26 NOTE — Assessment & Plan Note (Signed)
Chronic occ symptomatic with exposure to dust, pollen, URI Uses albuterol prn - about 2/year Mild, intermittent - controlled

## 2020-07-26 NOTE — Assessment & Plan Note (Signed)
Sees derm annually - Dr Renda Rolls

## 2020-07-26 NOTE — Assessment & Plan Note (Signed)
Chronic BP well controlled Current regimen effective and well tolerated Continue current medications at current doses cmp  

## 2020-07-26 NOTE — Assessment & Plan Note (Signed)
Chronic Bilateral knee arthritis Pain has improved with weight loss, regular exercise Recently had an injection in the right knee

## 2020-07-27 LAB — COMPREHENSIVE METABOLIC PANEL
AG Ratio: 1.8 (calc) (ref 1.0–2.5)
ALT: 29 U/L (ref 6–29)
AST: 16 U/L (ref 10–35)
Albumin: 4.4 g/dL (ref 3.6–5.1)
Alkaline phosphatase (APISO): 61 U/L (ref 37–153)
BUN: 17 mg/dL (ref 7–25)
CO2: 30 mmol/L (ref 20–32)
Calcium: 9.6 mg/dL (ref 8.6–10.4)
Chloride: 103 mmol/L (ref 98–110)
Creat: 0.53 mg/dL (ref 0.50–1.05)
Globulin: 2.4 g/dL (calc) (ref 1.9–3.7)
Glucose, Bld: 104 mg/dL — ABNORMAL HIGH (ref 65–99)
Potassium: 4.5 mmol/L (ref 3.5–5.3)
Sodium: 140 mmol/L (ref 135–146)
Total Bilirubin: 0.3 mg/dL (ref 0.2–1.2)
Total Protein: 6.8 g/dL (ref 6.1–8.1)

## 2020-07-27 LAB — LIPID PANEL
Cholesterol: 227 mg/dL — ABNORMAL HIGH (ref <200)
HDL: 53 mg/dL (ref >=50)
LDL Cholesterol (Calc): 156 mg/dL (calc) — ABNORMAL HIGH
Non-HDL Cholesterol (Calc): 174 mg/dL (calc) — ABNORMAL HIGH (ref <130)
Total CHOL/HDL Ratio: 4.3 (calc) (ref <5.0)
Triglycerides: 77 mg/dL (ref <150)

## 2020-07-27 LAB — CBC WITH DIFFERENTIAL/PLATELET
Absolute Monocytes: 601 cells/uL (ref 200–950)
Basophils Absolute: 69 cells/uL (ref 0–200)
Basophils Relative: 0.9 %
Eosinophils Absolute: 131 cells/uL (ref 15–500)
Eosinophils Relative: 1.7 %
HCT: 42 % (ref 35.0–45.0)
Hemoglobin: 14 g/dL (ref 11.7–15.5)
Lymphs Abs: 1671 cells/uL (ref 850–3900)
MCH: 32.3 pg (ref 27.0–33.0)
MCHC: 33.3 g/dL (ref 32.0–36.0)
MCV: 96.8 fL (ref 80.0–100.0)
MPV: 10.6 fL (ref 7.5–12.5)
Monocytes Relative: 7.8 %
Neutro Abs: 5228 cells/uL (ref 1500–7800)
Neutrophils Relative %: 67.9 %
Platelets: 358 10*3/uL (ref 140–400)
RBC: 4.34 10*6/uL (ref 3.80–5.10)
RDW: 12.7 % (ref 11.0–15.0)
Total Lymphocyte: 21.7 %
WBC: 7.7 10*3/uL (ref 3.8–10.8)

## 2020-07-27 LAB — HEMOGLOBIN A1C
Hgb A1c MFr Bld: 5.7 % of total Hgb — ABNORMAL HIGH (ref <5.7)
Mean Plasma Glucose: 117 (calc)
eAG (mmol/L): 6.5 (calc)

## 2020-08-03 ENCOUNTER — Encounter (INDEPENDENT_AMBULATORY_CARE_PROVIDER_SITE_OTHER): Payer: Self-pay | Admitting: Family Medicine

## 2020-08-03 ENCOUNTER — Ambulatory Visit (INDEPENDENT_AMBULATORY_CARE_PROVIDER_SITE_OTHER): Payer: BC Managed Care – PPO | Admitting: Family Medicine

## 2020-08-03 ENCOUNTER — Other Ambulatory Visit: Payer: Self-pay

## 2020-08-03 VITALS — BP 116/74 | HR 71 | Temp 98.1°F | Ht 63.0 in | Wt 265.0 lb

## 2020-08-03 DIAGNOSIS — E1169 Type 2 diabetes mellitus with other specified complication: Secondary | ICD-10-CM | POA: Diagnosis not present

## 2020-08-03 DIAGNOSIS — Z9189 Other specified personal risk factors, not elsewhere classified: Secondary | ICD-10-CM | POA: Diagnosis not present

## 2020-08-03 DIAGNOSIS — E559 Vitamin D deficiency, unspecified: Secondary | ICD-10-CM

## 2020-08-03 DIAGNOSIS — Z6841 Body Mass Index (BMI) 40.0 and over, adult: Secondary | ICD-10-CM

## 2020-08-03 MED ORDER — VITAMIN D (ERGOCALCIFEROL) 1.25 MG (50000 UNIT) PO CAPS: 50000.0000 [IU] | ORAL_CAPSULE | ORAL | 0 refills | Status: DC

## 2020-08-03 NOTE — Progress Notes (Signed)
Chief Complaint:   OBESITY Rachel Norris is here to discuss her progress with her obesity treatment plan along with follow-up of her obesity related diagnoses. Rachel Norris is on the Category 3 Plan and states she is following her eating plan approximately 100% of the time. 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  Today's visit was #: 10 Starting weight: 319 lbs Starting date: 02/25/2020 Today's weight: 265 lbs Today's date: 08/03/2020 Total lbs lost to date: 54 Total lbs lost since last in-office visit: 3  Interim History: Rachel Norris has done an excellent job on our program. Rachel Norris notes hunger is controlled. She continues to adhere to the plan 100%. She is very happy with her progress.  Subjective:   1. Vitamin D deficiency Rachel Norris's last Vit D level was at goal. She is on weekly prescription Vit D.  2. Type 2 diabetes mellitus with other specified complication, without long-term current use of insulin (HCC) Rachel Norris's diabetes mellitus is well controlled on metformin. She is not on statin but her primary care physician told her she did not need statin at this time.  Lab Results  Component Value Date   HGBA1C 5.7 (H) 07/26/2020   HGBA1C 5.8 (H) 06/08/2020   HGBA1C 7.1 (H) 02/25/2020   Lab Results  Component Value Date   LDLCALC 156 (H) 07/26/2020   CREATININE 0.53 07/26/2020   Lab Results  Component Value Date   INSULIN 8.8 06/08/2020   INSULIN 13.7 02/25/2020   3. At risk for heart disease Rachel Norris is at a higher than average risk for cardiovascular disease due to obesity, diabetes mellitus, hyperlipidemia, and ASCVD risk score is 5.9%.   Assessment/Plan:   1. Vitamin D deficiency Low Vitamin D level contributes to fatigue and are associated with obesity, breast, and colon cancer. We will refill prescription Vitamin D for 1 month. Ameila will follow-up for routine testing of Vitamin D, at least 2-3 times per year to avoid over-replacement.  - Vitamin D,  Ergocalciferol, (DRISDOL) 1.25 MG (50000 UNIT) CAPS capsule; Take 1 capsule (50,000 Units total) by mouth every 7 (seven) days.  Dispense: 4 capsule; Refill: 0  2. Type 2 diabetes mellitus with other specified complication, without long-term current use of insulin ([place]) . Jaydn will continue metformin, and she will continue to follow up as directed.   3. At risk for heart disease Rachel Norris was given approximately 15 minutes of coronary artery disease prevention counseling today. She is 59 y.o. female and has risk factors for heart disease including obesity. We discussed intensive lifestyle modifications today with an emphasis on specific weight loss instructions and strategies.   Repetitive spaced learning was employed today to elicit superior memory formation and behavioral change.  4. Class 3 severe obesity with serious comorbidity and body mass index (BMI) of 45.0 to 49.9 in adult, unspecified obesity type [facility]) Rachel Norris is currently in the action stage of change. As such, her goal is to continue with weight loss efforts. She has agreed to the Category 3 Plan.   Breakfast options added.  Exercise goals: As is.  Behavioral modification strategies: meal planning and cooking strategies and planning for success.  Rachel Norris has agreed to follow-up with our clinic in 3 weeks. She was informed of the importance of frequent follow-up visits to maximize her success with intensive lifestyle modifications for her multiple health conditions.   Objective:   Blood pressure 116/74, pulse 71, temperature 98.1 F (36.7 C), temperature source Oral, height 5'  3" (1.6 m), weight 265 lb (120.2 kg), last menstrual period 09/15/2016, SpO2 96 %. Body mass index is 46.94 kg/m.  General: Cooperative, alert, well developed, in no acute distress. HEENT: Conjunctivae and lids unremarkable. Cardiovascular: Regular rhythm.  Lungs: Normal work of breathing. Neurologic: No focal deficits.   Lab Results  Component Value  Date   CREATININE 0.53 07/26/2020   BUN 17 07/26/2020   NA 140 07/26/2020   K 4.5 07/26/2020   CL 103 07/26/2020   CO2 30 07/26/2020   Lab Results  Component Value Date   ALT 29 07/26/2020   AST 16 07/26/2020   ALKPHOS 78 06/08/2020   BILITOT 0.3 07/26/2020   Lab Results  Component Value Date   HGBA1C 5.7 (H) 07/26/2020   HGBA1C 5.8 (H) 06/08/2020   HGBA1C 7.1 (H) 02/25/2020   HGBA1C 6.9 (H) 11/05/2019   HGBA1C 6.8 (H) 07/24/2019   Lab Results  Component Value Date   INSULIN 8.8 06/08/2020   INSULIN 13.7 02/25/2020   Lab Results  Component Value Date   TSH 2.510 02/25/2020   Lab Results  Component Value Date   CHOL 227 (H) 07/26/2020   HDL 53 07/26/2020   LDLCALC 156 (H) 07/26/2020   LDLDIRECT 144.2 07/20/2013   TRIG 77 07/26/2020   CHOLHDL 4.3 07/26/2020   Lab Results  Component Value Date   WBC 7.7 07/26/2020   HGB 14.0 07/26/2020   HCT 42.0 07/26/2020   MCV 96.8 07/26/2020   PLT 358 07/26/2020   Lab Results  Component Value Date   FERRITIN 32 03/26/2016   Attestation Statements:   Reviewed by clinician on day of visit: allergies, medications, problem list, medical history, surgical history, family history, social history, and previous encounter notes.   Wilhemena Durie, am acting as Location manager for [employer], FNP-C.  I have reviewed the above documentation for accuracy and completeness, and I agree with the above. -  Georgianne Fick, FNP

## 2020-08-03 NOTE — Progress Notes (Signed)
The 10-year ASCVD risk score Mikey Bussing DC Brooke Bonito., et al., 2013) is: 5.9%   Values used to calculate the score:     Age: 59 years     Sex: Female     Is Non-Hispanic African American: No     Diabetic: Yes     Tobacco smoker: No     Systolic Blood Pressure: 876 mmHg     Is BP treated: Yes     HDL Cholesterol: 53 mg/dL     Total Cholesterol: 227 mg/dL

## 2020-08-08 ENCOUNTER — Encounter (INDEPENDENT_AMBULATORY_CARE_PROVIDER_SITE_OTHER): Payer: Self-pay | Admitting: Family Medicine

## 2020-08-23 NOTE — Progress Notes (Signed)
59 y.o. G0P0000 Married White or Caucasian Not Hispanic or Latino female here for annual exam.   No vaginal bleeding.   She was having some incontinence, it has improved with weight loss and exercise.     She has lost 58 lbs since 3/21. She was diagnosed with DM last year, HgbA1C was 7.1. With her weight loss, her recent HgbA1C was 5.7.   Patient's last menstrual period was 09/15/2016.          Sexually active: No.  The current method of family planning is post menopausal status.    Exercising: Yes.    Gym/ health club routine includes cardio and light weights. Smoker:  no  Health Maintenance: Pap:  03/16/16 WNL HR Hpv Neg  History of abnormal Pap:  Yes in 1990 had a BX MMG:  02/15/20 density C bi rads 1 neg  BMD:   Never  Colonoscopy: 05/15/16 WNL TDaP: 12/31/10 Gardasil: NA   reports that she quit smoking about 24 years ago. Her smoking use included cigarettes. She has a 20.00 pack-year smoking history. She has never used smokeless tobacco. She reports current alcohol use. She reports that she does not use drugs. 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 Husband also retired.   Past Medical History:  Diagnosis Date  . Allergic rhinitis   . Ankle swelling   . Anxiety   . Anxiety and depression   . Arthritis    generalized arthritis -knees, feet.back.   . 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 At risk for sleep apnea    STOP-BANG= 5            SENT TO PCP 04-05-2015  . Back pain   . Bronchitis    Bronchitis a few months ago- no issues now. - no Inhalers used daily.  Tennis Must Quervain's tenosynovitis, right   . Depression   . Diabetes mellitus without complication ([place])    "pre diabetes"  . Edema, lower extremity   . Fibroid   . GERD (gastroesophageal reflux disease)   . Heart palpitations   . Hormone disorder   . Hypertension   . Irregular menstrual bleeding   . Joint pain   . Labial cyst    bilateral icclusion  . Left knee pain   . Obesity   . Palpitations    . Perimenopausal   . Plantar fasciitis, bilateral   . PONV (postoperative nausea and vomiting)   . Pre-diabetes   . Prediabetes   . Shortness of breath dyspnea    with exertion, climbing a flight of stairs  . Tendonitis, Achilles, left   . Thickened endometrium   . Wears glasses     Past Surgical History:  Procedure Laterality Date  . COLONOSCOPY WITH PROPOFOL N/A 05/15/2016   Procedure: COLONOSCOPY WITH PROPOFOL;  Surgeon: Mauri Pole, MD;  Location: [facility];  Service: Endoscopy;  Laterality: N/A;  . DILATATION & CURETTAGE/HYSTEROSCOPY WITH MYOSURE N/A 08/30/2016   Procedure: DILATATION & CURETTAGE/HYSTEROSCOPY WITH MYOSURE;  Surgeon: Salvadore Dom, MD;  Location: [facility];  Service: Gynecology;  Laterality: N/A;  hysteroscopic myomectomy. BMI 53.8  . DILATION AND CURETTAGE OF UTERUS    . EAR CYST EXCISION Bilateral 04/08/2015   Procedure: removal of bilateral labial inclusion cysts;  Surgeon: Linda Hedges, DO;  Location: [place];  Service: Gynecology;  Laterality: Bilateral;  . HYSTEROSCOPY    . HYSTEROSCOPY WITH D & C N/A 04/08/2015   Procedure:  DILATATION AND CURETTAGE /HYSTEROSCOPY, removal of bilateral labial inclusion cysts;  Surgeon: Linda Hedges, DO;  Location: [place];  Service: Gynecology;  Laterality: N/A;  . KNEE ARTHROSCOPY WITH MEDIAL MENISECTOMY Left 11/09/2019   Procedure: ARTHROSCOPY KNEE WITH PARTIAL MEDIAL MENISECTOMY, PARTIAL LATERAL MENISECTOMY, MEDIAL CHONDROPLASTY, MEDIAL PLICA EXCISION AND PATELLA FEMORAL CHONDROPLASTY;  Surgeon: Dorna Leitz, MD;  Location: [place];  Service: Orthopedics;  Laterality: Left;  . LASER ABLATION OF THE CERVIX  1992   DYSPLAGIA  . MYOMECTOMY N/A 08/30/2016   Procedure: HYSTEROSCOPIC MYOMECTOMY;  Surgeon: Salvadore Dom, MD;  Location: [facility];  Service: Gynecology;  Laterality: N/A;  . 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 acetaminophen (TYLENOL) 650 MG CR tablet Take 1,300 mg by mouth every 8 (eight) hours as needed for pain.    Marland Kitchen albuterol (VENTOLIN HFA) 108 (90 Base) MCG/ACT inhaler Inhale 2 puffs into the lungs every 4 (four) hours as needed for wheezing or shortness of breath.     . diphenhydrAMINE HCl, Sleep, (UNISOM SLEEPGELS) 50 MG CAPS Take 50 mg by mouth at bedtime as needed (sleep).    . fexofenadine (ALLEGRA) 180 MG tablet Take 180 mg by mouth daily as needed for allergies or rhinitis.    . fluticasone (FLONASE) 50 MCG/ACT nasal spray Place 2 sprays into both nostrils daily. (Patient taking differently: Place 2 sprays into both nostrils daily as needed for allergies. ) 16 g 0  . glucose blood (ACCU-CHEK GUIDE) test strip USE TO CHECK BLOOD SUGAR DAILY 100 strip 12  . hydrochlorothiazide (HYDRODIURIL) 25 MG tablet Take 1 tablet (25 mg total) by mouth daily. 90 tablet 2  . Lancets (ACCU-CHEK SOFT TOUCH) lancets CHECK BLOOD SUGAR ONCE DAILY 100 each 12  . losartan (COZAAR) 100 MG tablet Take 1 tablet (100 mg total) by mouth daily. 90 tablet 2  . Menthol, Topical Analgesic, (BIOFREEZE EX) Apply 1 application topically daily as needed (knee pain).    . metFORMIN (GLUCOPHAGE) 500 MG tablet Take 1,000 mg qAM and 500 mg qPM 270 tablet 1  . Multiple Vitamins-Minerals (HAIR SKIN AND NAILS FORMULA PO) Take by mouth.    Marland Kitchen omeprazole (PRILOSEC) 40 MG capsule Take 1 capsule (40 mg total) by mouth daily as needed (For heartburn or acid reflux.). 90 capsule 3  . Specialty Vitamins Products (VITAMINS FOR HAIR) TABS Take by mouth daily. [facility] [facility]    . Vitamin D, Ergocalciferol, (DRISDOL) 1.25 MG (50000 UNIT) CAPS capsule Take 1 capsule (50,000 Units total) by mouth every 7 (seven) days. 4 capsule 0   No current facility-administered medications for this visit.    Family History  Problem  Relation Age of Onset  . Diabetes Mother        M anmd others  . Dementia Mother   . Stroke Mother   . Anxiety disorder Mother   . Stroke Father   . AAA (abdominal aortic aneurysm) Father   . Hypertension Father   . Hyperlipidemia Father   . Heart disease Father   . Atrial fibrillation Sister   . Asthma Sister   . Allergic rhinitis Sister   . Bronchitis Sister   . Angioedema Sister   . Allergic rhinitis Brother   . Asthma Other   . Allergic rhinitis Sister   . Bronchitis Sister   . Colon cancer  Neg Hx   . Breast cancer Neg Hx   . Coronary artery disease Neg Hx   . Eczema Neg Hx   . Urticaria Neg Hx   . Immunodeficiency Neg Hx     Review of Systems  All other systems reviewed and are negative.   Exam:   BP 138/62   Pulse 91   Ht 5' 2.5" (1.588 m)   Wt 266 lb (120.7 kg)   LMP 09/15/2016   SpO2 96%   BMI 47.88 kg/m   Weight change: @WEIGHTCHANGE @ Height:   Height: 5' 2.5" (158.8 cm)  Ht Readings from Last 3 Encounters:  08/25/20 5' 2.5" (1.588 m)  08/24/20 5\' 3"  (1.6 m)  08/03/20 5\' 3"  (1.6 m)    General appearance: alert, cooperative and appears stated age Head: Normocephalic, without obvious abnormality, atraumatic Neck: no adenopathy, supple, symmetrical, trachea midline and thyroid normal to inspection and palpation Lungs: clear to auscultation bilaterally Cardiovascular: regular rate and rhythm Breasts: normal appearance, no masses or tenderness Abdomen: soft, non-tender; non distended,  no masses,  no organomegaly Extremities: extremities normal, atraumatic, no cyanosis or edema Skin: Skin color, texture, turgor normal. No rashes or lesions Lymph nodes: Cervical, supraclavicular, and axillary nodes normal. No abnormal inguinal nodes palpated Neurologic: Grossly normal   Pelvic: External genitalia:  no lesions              Urethra:  normal appearing urethra with no masses, tenderness or lesions              Bartholins and Skenes: normal                  Vagina: normal appearing vagina with normal color and discharge, no lesions              Cervix: no lesions               Bimanual Exam:  Uterus:  normal size, contour, position, consistency, mobility, non-tender and anteverted              Adnexa: no mass, fullness, tenderness               Rectovaginal: Confirms               Anus:  normal sphincter tone, no lesions  Shanon Petty chaperoned for the exam.  A:  Well Woman with normal exam  BMI is 47, down from BMI of 56 last year.   P:   Labs with primary  Pap with hpv  Discussed breast self exam  Discussed calcium and vit D intake  Mammogram and colonoscopy UTD

## 2020-08-24 ENCOUNTER — Ambulatory Visit (INDEPENDENT_AMBULATORY_CARE_PROVIDER_SITE_OTHER): Payer: BC Managed Care – PPO | Admitting: Family Medicine

## 2020-08-24 ENCOUNTER — Other Ambulatory Visit: Payer: Self-pay

## 2020-08-24 ENCOUNTER — Encounter (INDEPENDENT_AMBULATORY_CARE_PROVIDER_SITE_OTHER): Payer: Self-pay | Admitting: Family Medicine

## 2020-08-24 VITALS — BP 120/73 | HR 62 | Temp 97.4°F | Ht 63.0 in | Wt 261.0 lb

## 2020-08-24 DIAGNOSIS — E1169 Type 2 diabetes mellitus with other specified complication: Secondary | ICD-10-CM | POA: Diagnosis not present

## 2020-08-24 DIAGNOSIS — Z6841 Body Mass Index (BMI) 40.0 and over, adult: Secondary | ICD-10-CM | POA: Diagnosis not present

## 2020-08-24 NOTE — Progress Notes (Signed)
Chief Complaint:   OBESITY Rachel Norris is here to discuss her progress with her obesity treatment plan along with follow-up of her obesity related diagnoses. Rachel Norris is on the Category 3 Plan and states she is following her eating plan approximately 100% of the time. 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  Today's visit was #: 11 Starting weight: 319 lbs Starting date: 02/25/2020 Today's weight: 261 lbs Today's date: 08/24/2020 Total lbs lost to date: 58 Total lbs lost since last in-office visit: 4  Interim History: Rachel Norris continues to do very well on the plan. She is down a total of 58 lbs over the last 6 months. She has also increased her exercise. Her hunger is satisfied. Her chronic joint pain has decreased considerably.   Subjective:   1. Type 2 diabetes mellitus with other specified complication, without long-term current use of insulin (HCC) Rachel Norris's diabetes mellitus is very well controlled on metformin. Last A1c was 5.7, down from 7.1 in February 2021. She denies polyphagia.  Lab Results  Component Value Date   HGBA1C 5.7 (H) 07/26/2020   HGBA1C 5.8 (H) 06/08/2020   HGBA1C 7.1 (H) 02/25/2020   Lab Results  Component Value Date   LDLCALC 156 (H) 07/26/2020   CREATININE 0.53 07/26/2020   Lab Results  Component Value Date   INSULIN 8.8 06/08/2020   INSULIN 13.7 02/25/2020   Assessment/Plan:   1. Type 2 diabetes mellitus with other specified complication, without long-term current use of insulin (HCC)  Rachel Norris will continue metformin, and will follow up as directed.  2. Class 3 severe obesity with serious comorbidity and body mass index (BMI) of 45.0 to 49.9 in adult, unspecified obesity type [facility]) Rachel Norris is currently in the action stage of change. As such, her goal is to continue with weight loss efforts. She has agreed to the Category 3 Plan and keeping a food journal and adhering to recommended goals of 450-600  calories and 40 grams of protein at supper daily.   Recipes provided, also provided journaling handout.  Exercise goals: As is.  Behavioral modification strategies: meal planning and cooking strategies.  Rachel Norris has agreed to follow-up with our clinic in 3 weeks. She was informed of the importance of frequent follow-up visits to maximize her success with intensive lifestyle modifications for her multiple health conditions.   Objective:   Blood pressure 120/73, pulse 62, temperature (!) 97.4 F (36.3 C), temperature source Oral, height 5\' 3"  (1.6 m), weight 261 lb (118.4 kg), last menstrual period 09/15/2016, SpO2 98 %. Body mass index is 46.23 kg/m.  General: Cooperative, alert, well developed, in no acute distress. HEENT: Conjunctivae and lids unremarkable. Cardiovascular: Regular rhythm.  Lungs: Normal work of breathing. Neurologic: No focal deficits.   Lab Results  Component Value Date   CREATININE 0.53 07/26/2020   BUN 17 07/26/2020   NA 140 07/26/2020   K 4.5 07/26/2020   CL 103 07/26/2020   CO2 30 07/26/2020   Lab Results  Component Value Date   ALT 29 07/26/2020   AST 16 07/26/2020   ALKPHOS 78 06/08/2020   BILITOT 0.3 07/26/2020   Lab Results  Component Value Date   HGBA1C 5.7 (H) 07/26/2020   HGBA1C 5.8 (H) 06/08/2020   HGBA1C 7.1 (H) 02/25/2020   HGBA1C 6.9 (H) 11/05/2019   HGBA1C 6.8 (H) 07/24/2019   Lab Results  Component Value Date   INSULIN 8.8 06/08/2020   INSULIN  13.7 02/25/2020   Lab Results  Component Value Date   TSH 2.510 02/25/2020   Lab Results  Component Value Date   CHOL 227 (H) 07/26/2020   HDL 53 07/26/2020   LDLCALC 156 (H) 07/26/2020   LDLDIRECT 144.2 07/20/2013   TRIG 77 07/26/2020   CHOLHDL 4.3 07/26/2020   Lab Results  Component Value Date   WBC 7.7 07/26/2020   HGB 14.0 07/26/2020   HCT 42.0 07/26/2020   MCV 96.8 07/26/2020   PLT 358 07/26/2020   Lab Results  Component Value Date   FERRITIN 32 03/26/2016    Attestation Statements:   Reviewed by clinician on day of visit: allergies, medications, problem list, medical history, surgical history, family history, social history, and previous encounter notes.   Wilhemena Durie, am acting as Location manager for [employer], FNP-C.  I have reviewed the above documentation for accuracy and completeness, and I agree with the above. -  Georgianne Fick, FNP

## 2020-08-25 ENCOUNTER — Other Ambulatory Visit (HOSPITAL_COMMUNITY)
Admission: RE | Admit: 2020-08-25 | Discharge: 2020-08-25 | Disposition: A | Discharge status: Home / Self Care | Payer: BC Managed Care – PPO | Source: Ambulatory Visit | Attending: Obstetrics and Gynecology | Admitting: Obstetrics and Gynecology

## 2020-08-25 ENCOUNTER — Ambulatory Visit: Payer: BC Managed Care – PPO | Admitting: Obstetrics and Gynecology

## 2020-08-25 ENCOUNTER — Encounter (INDEPENDENT_AMBULATORY_CARE_PROVIDER_SITE_OTHER): Payer: Self-pay | Admitting: Family Medicine

## 2020-08-25 ENCOUNTER — Encounter: Payer: Self-pay | Admitting: Obstetrics and Gynecology

## 2020-08-25 VITALS — BP 138/62 | HR 91 | Ht 62.5 in | Wt 266.0 lb

## 2020-08-25 DIAGNOSIS — Z124 Encounter for screening for malignant neoplasm of cervix: Secondary | ICD-10-CM | POA: Insufficient documentation

## 2020-08-25 DIAGNOSIS — Z01419 Encounter for gynecological examination (general) (routine) without abnormal findings: Secondary | ICD-10-CM | POA: Diagnosis not present

## 2020-08-25 NOTE — Patient Instructions (Signed)

## 2020-08-26 LAB — CYTOLOGY - PAP
Comment: NEGATIVE
Diagnosis: NEGATIVE
High risk HPV: POSITIVE — AB

## 2020-09-14 ENCOUNTER — Other Ambulatory Visit: Payer: Self-pay

## 2020-09-14 ENCOUNTER — Encounter (INDEPENDENT_AMBULATORY_CARE_PROVIDER_SITE_OTHER): Payer: Self-pay | Admitting: Family Medicine

## 2020-09-14 ENCOUNTER — Ambulatory Visit (INDEPENDENT_AMBULATORY_CARE_PROVIDER_SITE_OTHER): Payer: BC Managed Care – PPO | Admitting: Family Medicine

## 2020-09-14 VITALS — BP 105/66 | HR 64 | Temp 97.8°F | Ht 63.0 in | Wt 257.0 lb

## 2020-09-14 DIAGNOSIS — Z6841 Body Mass Index (BMI) 40.0 and over, adult: Secondary | ICD-10-CM

## 2020-09-14 DIAGNOSIS — E1169 Type 2 diabetes mellitus with other specified complication: Secondary | ICD-10-CM | POA: Diagnosis not present

## 2020-09-14 DIAGNOSIS — E559 Vitamin D deficiency, unspecified: Secondary | ICD-10-CM

## 2020-09-15 ENCOUNTER — Encounter (INDEPENDENT_AMBULATORY_CARE_PROVIDER_SITE_OTHER): Payer: Self-pay | Admitting: Family Medicine

## 2020-09-15 NOTE — Progress Notes (Signed)
Chief Complaint:   OBESITY Miliana is here to discuss her progress with her obesity treatment plan along with follow-up of her obesity related diagnoses. [place] is on the Category 3 Plan and keeping a food journal and adhering to recommended goals of 450-600 calories and 40 grams of protein at supper daily and states she is following her eating plan approximately 99% of the time. Haniah states she is doing strengthening and cardio for 90-120 minutes 6 times per week.  Today's visit was #: 12 Starting weight: 319 lbs Starting date: 02/25/2020 Today's weight: 257 lbs Today's date: 09/14/2020 Total lbs lost to date: 62 Total lbs lost since last in-office visit: 4  Interim History: Katerine continues to do well with diet and exercise. She is very faithful with adhering to the plan, and going to the gym 6 days per week.  Subjective:   1. Type 2 diabetes mellitus with other specified complication, without long-term current use of insulin (HCC) Gaby's diabetes mellitus is very well controlled. Her fasting BGs are in the low 100's. She is on metformin only. Her A1c is down to 5.7 from 7.1.  Lab Results  Component Value Date   HGBA1C 5.7 (H) 07/26/2020   HGBA1C 5.8 (H) 06/08/2020   HGBA1C 7.1 (H) 02/25/2020   Lab Results  Component Value Date   LDLCALC 156 (H) 07/26/2020   CREATININE 0.53 07/26/2020   Lab Results  Component Value Date   INSULIN 8.8 06/08/2020   INSULIN 13.7 02/25/2020   2. Vitamin D deficiency Kemoni's Vit D level is at goal (50.3). She is on prescription Vit D.  Assessment/Plan:   1. Type 2 diabetes mellitus with other specified complication, without long-term current use of insulin (HCC)  Lesbia will continue metformin, and will continue to follow up as directed.  2. Vitamin D deficiency  We will refill prescription Vitamin D for 1 month.   3. Class 3 severe obesity with serious comorbidity and body mass index (BMI) of 45.0 to 49.9 in adult, unspecified obesity  type [facility]) Maren is currently in the action stage of change. As such, her goal is to continue with weight loss efforts. She has agreed to the Category 3 Plan.   Exercise goals: As is.  Behavioral modification strategies: meal planning and cooking strategies and planning for success.  Giulianna has agreed to follow-up with our clinic in 3 weeks.  Objective:   Blood pressure 105/66, pulse 64, temperature 97.8 F (36.6 C), temperature source Oral, height 5\' 3"  (1.6 m), weight 257 lb (116.6 kg), last menstrual period 09/15/2016, SpO2 95 %. Body mass index is 45.53 kg/m.  General: Cooperative, alert, well developed, in no acute distress. HEENT: Conjunctivae and lids unremarkable. Cardiovascular: Regular rhythm.  Lungs: Normal work of breathing. Neurologic: No focal deficits.   Lab Results  Component Value Date   CREATININE 0.53 07/26/2020   BUN 17 07/26/2020   NA 140 07/26/2020   K 4.5 07/26/2020   CL 103 07/26/2020   CO2 30 07/26/2020   Lab Results  Component Value Date   ALT 29 07/26/2020   AST 16 07/26/2020   ALKPHOS 78 06/08/2020   BILITOT 0.3 07/26/2020   Lab Results  Component Value Date   HGBA1C 5.7 (H) 07/26/2020   HGBA1C 5.8 (H) 06/08/2020   HGBA1C 7.1 (H) 02/25/2020   HGBA1C 6.9 (H) 11/05/2019   HGBA1C 6.8 (H) 07/24/2019   Lab Results  Component Value Date   INSULIN 8.8 06/08/2020   INSULIN 13.7  02/25/2020   Lab Results  Component Value Date   TSH 2.510 02/25/2020   Lab Results  Component Value Date   CHOL 227 (H) 07/26/2020   HDL 53 07/26/2020   LDLCALC 156 (H) 07/26/2020   LDLDIRECT 144.2 07/20/2013   TRIG 77 07/26/2020   CHOLHDL 4.3 07/26/2020   Lab Results  Component Value Date   WBC 7.7 07/26/2020   HGB 14.0 07/26/2020   HCT 42.0 07/26/2020   MCV 96.8 07/26/2020   PLT 358 07/26/2020   Lab Results  Component Value Date   FERRITIN 32 03/26/2016   Attestation Statements:   Reviewed by clinician on day of visit: allergies, medications,  problem list, medical history, surgical history, family history, social history, and previous encounter notes.   Wilhemena Durie, am acting as Location manager for [employer], FNP-C.  I have reviewed the above documentation for accuracy and completeness, and I agree with the above. -  Georgianne Fick, FNP

## 2020-09-26 ENCOUNTER — Ambulatory Visit: Payer: BC Managed Care – PPO

## 2020-10-04 ENCOUNTER — Ambulatory Visit (INDEPENDENT_AMBULATORY_CARE_PROVIDER_SITE_OTHER): Payer: BC Managed Care – PPO

## 2020-10-04 ENCOUNTER — Other Ambulatory Visit: Payer: Self-pay

## 2020-10-04 DIAGNOSIS — Z23 Encounter for immunization: Secondary | ICD-10-CM

## 2020-10-04 NOTE — Progress Notes (Signed)
Pt rec'd shingles #2 in left deltoid; tolerated well.

## 2020-10-05 ENCOUNTER — Ambulatory Visit: Payer: BC Managed Care – PPO

## 2020-10-05 ENCOUNTER — Ambulatory Visit (INDEPENDENT_AMBULATORY_CARE_PROVIDER_SITE_OTHER): Payer: BC Managed Care – PPO | Admitting: Family Medicine

## 2020-10-11 ENCOUNTER — Other Ambulatory Visit: Payer: Self-pay

## 2020-10-11 ENCOUNTER — Ambulatory Visit (INDEPENDENT_AMBULATORY_CARE_PROVIDER_SITE_OTHER): Payer: BC Managed Care – PPO | Admitting: Family Medicine

## 2020-10-11 ENCOUNTER — Encounter (INDEPENDENT_AMBULATORY_CARE_PROVIDER_SITE_OTHER): Payer: Self-pay | Admitting: Family Medicine

## 2020-10-11 VITALS — BP 130/77 | HR 73 | Temp 97.9°F | Ht 63.0 in | Wt 255.0 lb

## 2020-10-11 DIAGNOSIS — I152 Hypertension secondary to endocrine disorders: Secondary | ICD-10-CM | POA: Diagnosis not present

## 2020-10-11 DIAGNOSIS — E1159 Type 2 diabetes mellitus with other circulatory complications: Secondary | ICD-10-CM | POA: Diagnosis not present

## 2020-10-11 DIAGNOSIS — Z9189 Other specified personal risk factors, not elsewhere classified: Secondary | ICD-10-CM | POA: Diagnosis not present

## 2020-10-11 DIAGNOSIS — E559 Vitamin D deficiency, unspecified: Secondary | ICD-10-CM

## 2020-10-11 DIAGNOSIS — Z6841 Body Mass Index (BMI) 40.0 and over, adult: Secondary | ICD-10-CM

## 2020-10-11 DIAGNOSIS — I10 Essential (primary) hypertension: Secondary | ICD-10-CM

## 2020-10-11 MED ORDER — VITAMIN D (ERGOCALCIFEROL) 1.25 MG (50000 UNIT) PO CAPS: 50000.0000 [IU] | ORAL_CAPSULE | ORAL | 0 refills | Status: DC

## 2020-10-13 ENCOUNTER — Encounter (INDEPENDENT_AMBULATORY_CARE_PROVIDER_SITE_OTHER): Payer: Self-pay | Admitting: Family Medicine

## 2020-10-13 NOTE — Progress Notes (Signed)
Chief Complaint:   [place] is here to discuss her progress with her obesity treatment plan along with follow-up of her obesity related diagnoses. Rachel Norris is on the Category 3 Plan and states she is following her eating plan approximately 98% of the time. Rachel Norris states she is doing circuit 60 minutes 4-5 times per week.  Today's visit was #: 40 Starting weight: 319 lbs Starting date: 02/25/2020 Today's weight: 255 lbs Today's date: 10/11/2020 Total lbs lost to date: 64 Total lbs lost since last in-office visit: 2  Interim History: Rachel Norris traveled recently and had to eat slightly off plan due to food availability. She still lost 2 lbs today. She was quite successful using portion Biomedical scientist. She is down a total of 64 lbs.  Subjective:   Vitamin D deficiency. Last Vitamin D level was at goal at 50.3. Rachel Norris is on weekly prescription Vitamin D.   Ref. Range 06/08/2020 11:03  Vitamin D, 25-Hydroxy Latest Ref Range: 30.0 - 100.0 ng/mL 50.3   Essential hypertension. Blood pressure is well controlled. Rachel Norris is on HCTZ and losartan.  BP Readings from Last 3 Encounters:  10/11/20 130/77  09/14/20 105/66  08/25/20 138/62   Lab Results  Component Value Date   CREATININE 0.53 07/26/2020   CREATININE 0.65 06/08/2020   CREATININE 0.65 02/25/2020   At increased risk of exposure to COVID-19 virus. The patient is at higher risk of COVID-19 infection due to higher infection rates locally. Rachel Norris has chronic diseases and needs booster.  Assessment/Plan:   Vitamin D deficiency. Low Vitamin D level contributes to fatigue and are associated with obesity, breast, and colon cancer. She was given a refill on her Vitamin D, Ergocalciferol, (DRISDOL) 1.25 MG (50000 UNIT) CAPS capsule every week #4 with 0 refills and will follow-up for routine testing of Vitamin D, at least 2-3 times per year to avoid over-replacement.   Essential hypertension.   She will continue  all medications as directed.   At increased risk of exposure to COVID-19 virus. Rachel Norris was given approximately 15 minutes of COVID prevention counseling today. I encouraged her to obtain a booster when she is 6 months past date of last vaccine.   Repetitive spaced learning was employed today to elicit superior memory formation and behavioral change.  Class 3 severe obesity with serious comorbidity and body mass index (BMI) of 45.0 to 49.9 in adult, unspecified obesity type ([place]).  Rachel Norris is currently in the action stage of change. As such, her goal is to continue with weight loss efforts. She has agreed to the Category 3 Plan.   Exercise goals: Rachel Norris will continue her current exercise regimen.   Behavioral modification strategies: meal planning and cooking strategies and planning for success.  Rachel Norris has agreed to follow-up with our clinic in 3 weeks.   Objective:   Blood pressure 130/77, pulse 73, temperature 97.9 F (36.6 C), height 5\' 3"  (1.6 m), weight 255 lb (115.7 kg), last menstrual period 09/15/2016, SpO2 97 %. Body mass index is 45.17 kg/m.  General: Cooperative, alert, well developed, in no acute distress. HEENT: Conjunctivae and lids unremarkable. Cardiovascular: Regular rhythm.  Lungs: Normal work of breathing. Neurologic: No focal deficits.   Lab Results  Component Value Date   CREATININE 0.53 07/26/2020   BUN 17 07/26/2020   NA 140 07/26/2020   K 4.5 07/26/2020   CL 103 07/26/2020   CO2 30 07/26/2020   Lab Results  Component Value Date   ALT 29  07/26/2020   AST 16 07/26/2020   ALKPHOS 78 06/08/2020   BILITOT 0.3 07/26/2020   Lab Results  Component Value Date   HGBA1C 5.7 (H) 07/26/2020   HGBA1C 5.8 (H) 06/08/2020   HGBA1C 7.1 (H) 02/25/2020   HGBA1C 6.9 (H) 11/05/2019   HGBA1C 6.8 (H) 07/24/2019   Lab Results  Component Value Date   INSULIN 8.8 06/08/2020   INSULIN 13.7 02/25/2020   Lab Results  Component Value Date   TSH 2.510 02/25/2020   Lab  Results  Component Value Date   CHOL 227 (H) 07/26/2020   HDL 53 07/26/2020   LDLCALC 156 (H) 07/26/2020   LDLDIRECT 144.2 07/20/2013   TRIG 77 07/26/2020   CHOLHDL 4.3 07/26/2020   Lab Results  Component Value Date   WBC 7.7 07/26/2020   HGB 14.0 07/26/2020   HCT 42.0 07/26/2020   MCV 96.8 07/26/2020   PLT 358 07/26/2020   Lab Results  Component Value Date   FERRITIN 32 03/26/2016   Attestation Statements:   Reviewed by clinician on day of visit: allergies, medications, problem list, medical history, surgical history, family history, social history, and previous encounter notes.  IMichaelene Song, am acting as Location manager for [employer], FNP-C   I have reviewed the above documentation for accuracy and completeness, and I agree with the above. - Georgianne Fick, FNP

## 2020-10-31 ENCOUNTER — Ambulatory Visit (INDEPENDENT_AMBULATORY_CARE_PROVIDER_SITE_OTHER): Payer: BC Managed Care – PPO | Admitting: Family Medicine

## 2020-11-07 ENCOUNTER — Other Ambulatory Visit: Payer: Self-pay

## 2020-11-07 ENCOUNTER — Ambulatory Visit (INDEPENDENT_AMBULATORY_CARE_PROVIDER_SITE_OTHER): Payer: BC Managed Care – PPO | Admitting: Family Medicine

## 2020-11-07 ENCOUNTER — Encounter (INDEPENDENT_AMBULATORY_CARE_PROVIDER_SITE_OTHER): Payer: Self-pay | Admitting: Family Medicine

## 2020-11-07 VITALS — BP 131/74 | HR 55 | Temp 98.0°F | Ht 63.0 in | Wt 249.0 lb

## 2020-11-07 DIAGNOSIS — Z6841 Body Mass Index (BMI) 40.0 and over, adult: Secondary | ICD-10-CM | POA: Diagnosis not present

## 2020-11-07 DIAGNOSIS — E559 Vitamin D deficiency, unspecified: Secondary | ICD-10-CM | POA: Diagnosis not present

## 2020-11-07 DIAGNOSIS — E1169 Type 2 diabetes mellitus with other specified complication: Secondary | ICD-10-CM | POA: Diagnosis not present

## 2020-11-07 MED ORDER — VITAMIN D (ERGOCALCIFEROL) 1.25 MG (50000 UNIT) PO CAPS: 50000.0000 [IU] | ORAL_CAPSULE | ORAL | 0 refills | Status: DC

## 2020-11-08 NOTE — Progress Notes (Signed)
Chief Complaint:   OBESITY Rachel Norris is here to discuss her progress with her obesity treatment plan along with follow-up of her obesity related diagnoses. Rachel Norris is on the Category 3 Plan and states she is following her eating plan approximately 98% of the time. Rachel Norris states she is at the gym and working around the house for 6 hours 6 times per week.   Today's visit was #: 14 Starting weight: 319 lbs Starting date: 02/25/2020 Today's weight: 249 lbs Today's date: 11/07/2020 Total lbs lost to date: 70 Total lbs lost since last in-office visit: 6  Interim History: Rachel Norris has lost 6 lbs today and is now down 70 lbs since February 2021. She always adheres to the plan very well.  She has been very active with home cleaning and painting but has not been going to the gym. She notes occasional food cravings.  Subjective:   1. Vitamin D deficiency Rachel Norris's last Vit D level was at goal at 32. She is on prescription Vit D 50,000 IU weekly.  2. Type 2 diabetes mellitus with other specified complication, without long-term current use of insulin (HCC) Rachel Norris's diabetes mellitus is well controlled. Last A1c was 5.7, and her CBGs range between 109 and 122. She is on metformin only. A1c was down from 7.1 on 02/25/2020.  Lab Results  Component Value Date   HGBA1C 5.7 (H) 07/26/2020   HGBA1C 5.8 (H) 06/08/2020   HGBA1C 7.1 (H) 02/25/2020   Lab Results  Component Value Date   LDLCALC 156 (H) 07/26/2020   CREATININE 0.53 07/26/2020   Lab Results  Component Value Date   INSULIN 8.8 06/08/2020   INSULIN 13.7 02/25/2020   Assessment/Plan:   1. Vitamin D deficiency  We will refill prescription Vitamin D for 1 month.   - Vitamin D, Ergocalciferol, (DRISDOL) 1.25 MG (50000 UNIT) CAPS capsule; Take 1 capsule (50,000 Units total) by mouth every 7 (seven) days.  Dispense: 4 capsule; Refill: 0  2. Type 2 diabetes mellitus with other specified complication, without long-term current use of insulin  (HCC)  Rachel Norris will continue metformin, and will continue to follow up as directed.  3. Class 3 severe obesity with serious comorbidity and body mass index (BMI) of 40.0 to 44.9 in adult, unspecified obesity type [facility]) Rachel Norris is currently in the action stage of change. As such, her goal is to continue with weight loss efforts. She has agreed to the Category 3 Plan.   Exercise goals: As is.  Behavioral modification strategies: decreasing simple carbohydrates, better snacking choices and planning for success.  Rachel Norris has agreed to follow-up with our clinic in 3 weeks.   Objective:   Blood pressure 131/74, pulse (!) 55, temperature 98 F (36.7 C), height 5\' 3"  (1.6 m), weight 249 lb (112.9 kg), last menstrual period 09/15/2016, SpO2 96 %. Body mass index is 44.11 kg/m.  General: Cooperative, alert, well developed, in no acute distress. HEENT: Conjunctivae and lids unremarkable. Cardiovascular: Regular rhythm.  Lungs: Normal work of breathing. Neurologic: No focal deficits.   Lab Results  Component Value Date   CREATININE 0.53 07/26/2020   BUN 17 07/26/2020   NA 140 07/26/2020   K 4.5 07/26/2020   CL 103 07/26/2020   CO2 30 07/26/2020   Lab Results  Component Value Date   ALT 29 07/26/2020   AST 16 07/26/2020   ALKPHOS 78 06/08/2020   BILITOT 0.3 07/26/2020   Lab Results  Component Value Date   HGBA1C 5.7 (H) 07/26/2020  HGBA1C 5.8 (H) 06/08/2020   HGBA1C 7.1 (H) 02/25/2020   HGBA1C 6.9 (H) 11/05/2019   HGBA1C 6.8 (H) 07/24/2019   Lab Results  Component Value Date   INSULIN 8.8 06/08/2020   INSULIN 13.7 02/25/2020   Lab Results  Component Value Date   TSH 2.510 02/25/2020   Lab Results  Component Value Date   CHOL 227 (H) 07/26/2020   HDL 53 07/26/2020   LDLCALC 156 (H) 07/26/2020   LDLDIRECT 144.2 07/20/2013   TRIG 77 07/26/2020   CHOLHDL 4.3 07/26/2020   Lab Results  Component Value Date   WBC 7.7 07/26/2020   HGB 14.0 07/26/2020   HCT 42.0 07/26/2020    MCV 96.8 07/26/2020   PLT 358 07/26/2020   Lab Results  Component Value Date   FERRITIN 32 03/26/2016   Attestation Statements:   Reviewed by clinician on day of visit: allergies, medications, problem list, medical history, surgical history, family history, social history, and previous encounter notes.   Wilhemena Durie, am acting as Location manager for [employer], FNP-C.  I have reviewed the above documentation for accuracy and completeness, and I agree with the above. -  Georgianne Fick, FNP

## 2020-11-09 ENCOUNTER — Encounter (INDEPENDENT_AMBULATORY_CARE_PROVIDER_SITE_OTHER): Payer: Self-pay | Admitting: Family Medicine

## 2020-11-28 ENCOUNTER — Ambulatory Visit (INDEPENDENT_AMBULATORY_CARE_PROVIDER_SITE_OTHER): Payer: BC Managed Care – PPO | Admitting: Family Medicine

## 2020-11-28 ENCOUNTER — Encounter (INDEPENDENT_AMBULATORY_CARE_PROVIDER_SITE_OTHER): Payer: Self-pay | Admitting: Family Medicine

## 2020-11-28 ENCOUNTER — Other Ambulatory Visit: Payer: Self-pay

## 2020-11-28 VITALS — BP 121/71 | HR 60 | Temp 97.6°F | Ht 63.0 in | Wt 247.0 lb

## 2020-11-28 DIAGNOSIS — E559 Vitamin D deficiency, unspecified: Secondary | ICD-10-CM | POA: Diagnosis not present

## 2020-11-28 DIAGNOSIS — E1169 Type 2 diabetes mellitus with other specified complication: Secondary | ICD-10-CM

## 2020-11-28 DIAGNOSIS — Z6841 Body Mass Index (BMI) 40.0 and over, adult: Secondary | ICD-10-CM | POA: Diagnosis not present

## 2020-11-28 MED ORDER — VITAMIN D (ERGOCALCIFEROL) 1.25 MG (50000 UNIT) PO CAPS: 50000.0000 [IU] | ORAL_CAPSULE | ORAL | 0 refills | Status: DC

## 2020-11-28 MED ORDER — ACCU-CHEK SOFT TOUCH LANCETS MISC: 1 refills | Status: DC

## 2020-11-28 NOTE — Progress Notes (Signed)
Chief Complaint:   Rachel Norris is here to discuss her progress with her obesity treatment plan along with follow-up of her obesity related diagnoses. Rachel Norris is on the Category 3 Plan and states she is following her eating plan approximately 98% of the time. Rachel Norris states she is going to the gym and doing a 30 minute circuit and cardio 60 minutes 3-4 times per week.  Today's visit was #: 15 Starting weight: 319 lbs Starting date: 02/25/2020 Today's weight: 247 lbs Today's date: 11/28/2020 Total lbs lost to date: 72 Total lbs lost since last in-office visit: 2  Interim History: Rachel Norris did indulge some over the holidays, but still lost 2 lbs today. She has not been exercising as much due to knee pain but still averages 3-4 times per week.She denies polyphagia. She does very well sticking to the plan even with eating out. She is now down 72 lbs!!  Subjective:   Type 2 diabetes mellitus with other specified complication, without long-term current use of insulin ([place]). Diabetes is well controlled. A1c 5.7 on 07/26/2020 (down from 7.1). Fasting CBG's 105 and 110 after a meal. Rachel Norris is on metformin.   Lab Results  Component Value Date   HGBA1C 5.7 (H) 07/26/2020   HGBA1C 5.8 (H) 06/08/2020   HGBA1C 7.1 (H) 02/25/2020   Lab Results  Component Value Date   LDLCALC 156 (H) 07/26/2020   CREATININE 0.53 07/26/2020   Lab Results  Component Value Date   INSULIN 8.8 06/08/2020   INSULIN 13.7 02/25/2020   Vitamin D deficiency. Last Vitamin D level was at goal at 50.3. Rachel Norris is on weekly prescription Vitamin D.   Ref. Range 06/08/2020 11:03  Vitamin D, 25-Hydroxy Latest Ref Range: 30.0 - 100.0 ng/mL 50.3   Assessment/Plan:   Type 2 diabetes mellitus with other specified complication, without long-term current use of insulin (Rachel Norris).  Refill was given for Lancets (ACCU-CHEK SOFT TOUCH) lancets BID #180 with 1 refill. Continue metformin.  Vitamin D deficiency. She was given a  refill on her Vitamin D, Ergocalciferol, (DRISDOL) 1.25 MG (50000 UNIT) CAPS capsule every week #12 with 0 refills and will follow-up for routine testing of Vitamin D, at least 2-3 times per year to avoid over-replacement.   Class 3 severe obesity with serious comorbidity and body mass index (BMI) of 40.0 to 44.9 in adult, unspecified obesity type (Rachel Norris).  Rachel Norris is currently in the action stage of change. As such, her goal is to continue with weight loss efforts. She has agreed to the Category 3 Plan.   We discussed low carb pasta options.   Exercise goals: Rachel Norris will continue going to the gym doing a 30 minute circuit and cardio 60 minutes 3-4 times per week.  Behavioral modification strategies: meal planning and cooking strategies and planning for success.  Rachel Norris has agreed to follow-up with our clinic in 3 weeks.   Objective:   Blood pressure 121/71, pulse 60, temperature 97.6 F (36.4 C), height 5\' 3"  (1.6 m), weight 247 lb (112 kg), last menstrual period 09/15/2016, SpO2 97 %. Body mass index is 43.75 kg/m.  General: Cooperative, alert, well developed, in no acute distress. HEENT: Conjunctivae and lids unremarkable. Cardiovascular: Regular rhythm.  Lungs: Normal work of breathing. Neurologic: No focal deficits.   Lab Results  Component Value Date   CREATININE 0.53 07/26/2020   BUN 17 07/26/2020   NA 140 07/26/2020   K 4.5 07/26/2020   CL 103 07/26/2020   CO2 30  07/26/2020   Lab Results  Component Value Date   ALT 29 07/26/2020   AST 16 07/26/2020   ALKPHOS 78 06/08/2020   BILITOT 0.3 07/26/2020   Lab Results  Component Value Date   HGBA1C 5.7 (H) 07/26/2020   HGBA1C 5.8 (H) 06/08/2020   HGBA1C 7.1 (H) 02/25/2020   HGBA1C 6.9 (H) 11/05/2019   HGBA1C 6.8 (H) 07/24/2019   Lab Results  Component Value Date   INSULIN 8.8 06/08/2020   INSULIN 13.7 02/25/2020   Lab Results  Component Value Date   TSH 2.510 02/25/2020   Lab Results  Component Value Date    CHOL 227 (H) 07/26/2020   HDL 53 07/26/2020   LDLCALC 156 (H) 07/26/2020   LDLDIRECT 144.2 07/20/2013   TRIG 77 07/26/2020   CHOLHDL 4.3 07/26/2020   Lab Results  Component Value Date   WBC 7.7 07/26/2020   HGB 14.0 07/26/2020   HCT 42.0 07/26/2020   MCV 96.8 07/26/2020   PLT 358 07/26/2020   Lab Results  Component Value Date   FERRITIN 32 03/26/2016   Attestation Statements:   Reviewed by clinician on day of visit: allergies, medications, problem list, medical history, surgical history, family history, social history, and previous encounter notes.  IMichaelene Song, am acting as Location manager for [employer], FNP-C   I have reviewed the above documentation for accuracy and completeness, and I agree with the above. -  Rachel Fick, FNP

## 2020-12-07 ENCOUNTER — Other Ambulatory Visit: Payer: Self-pay | Admitting: Internal Medicine

## 2020-12-07 DIAGNOSIS — E1169 Type 2 diabetes mellitus with other specified complication: Secondary | ICD-10-CM

## 2020-12-20 ENCOUNTER — Encounter (INDEPENDENT_AMBULATORY_CARE_PROVIDER_SITE_OTHER): Payer: Self-pay | Admitting: Family Medicine

## 2020-12-20 ENCOUNTER — Ambulatory Visit (INDEPENDENT_AMBULATORY_CARE_PROVIDER_SITE_OTHER): Payer: BC Managed Care – PPO | Admitting: Family Medicine

## 2020-12-20 ENCOUNTER — Other Ambulatory Visit: Payer: Self-pay

## 2020-12-20 VITALS — BP 129/80 | HR 73 | Temp 97.9°F | Ht 63.0 in | Wt 248.0 lb

## 2020-12-20 DIAGNOSIS — E1159 Type 2 diabetes mellitus with other circulatory complications: Secondary | ICD-10-CM | POA: Diagnosis not present

## 2020-12-20 DIAGNOSIS — I152 Hypertension secondary to endocrine disorders: Secondary | ICD-10-CM | POA: Diagnosis not present

## 2020-12-20 DIAGNOSIS — E1169 Type 2 diabetes mellitus with other specified complication: Secondary | ICD-10-CM

## 2020-12-20 DIAGNOSIS — Z6841 Body Mass Index (BMI) 40.0 and over, adult: Secondary | ICD-10-CM

## 2020-12-20 NOTE — Progress Notes (Signed)
Chief Complaint:   OBESITY Rachel Norris is here to discuss her progress with her obesity treatment plan along with follow-up of her obesity related diagnoses. Rachel Norris is on the Category 3 Plan and states she is following her eating plan approximately 98% of the time. Rachel Norris states she is doing cardio and lifting weights for 30-120 minutes 7 times per week.  Today's visit was #: 26 Starting weight: 319 lbs Starting date: 02/25/2020 Today's weight: 248 lbs Today's date: 12/20/2020 Total lbs lost to date: 71 lbs Total lbs lost since last in-office visit: 0  Interim History: Rachel Norris notes deviating from the plan a few times with trail mix.  She has started working out 7 days per week.  She is up 1 pound today, but down a total of 71 pounds overall.  She notes that in the past she has always fallen off the plan over the holidays, but not this time!.  Subjective:   1. Type 2 diabetes mellitus with other specified complication, without long-term current use of insulin (HCC) Well-controlled.  Last A1c was 5.7.  CBGs 105-112 (fasting).  A1c was 7.1 before weight loss.  Has occasional hunger.  Lab Results  Component Value Date   HGBA1C 5.7 (H) 07/26/2020   HGBA1C 5.8 (H) 06/08/2020   HGBA1C 7.1 (H) 02/25/2020   Lab Results  Component Value Date   LDLCALC 156 (H) 07/26/2020   CREATININE 0.53 07/26/2020   Lab Results  Component Value Date   INSULIN 8.8 06/08/2020   INSULIN 13.7 02/25/2020   2. Essential hypertension On losartan and HCTZ.  Well-controlled.  BP Readings from Last 3 Encounters:  12/20/20 129/80  11/28/20 121/71  11/07/20 131/74   Assessment/Plan:   1. Type 2 diabetes mellitus with other specified complication, without long-term current use of insulin (HCC) Continue metformin.  Discussed Ozempic briefly  For appetite control and she declines at this time. .   2. Essential hypertension Continue medications, diet, and exercise.  3. Class 3 severe obesity with serious  comorbidity and body mass index (BMI) of 40.0 to 44.9 in adult, unspecified obesity type [facility])  Rachel Norris is currently in the action stage of change. As such, her goal is to continue with weight loss efforts. She has agreed to the Category 3 Plan.   Exercise goals: As is.  Behavioral modification strategies: planning for success.  Rachel Norris has agreed to follow-up with our clinic in 3 weeks.   Objective:   Blood pressure 129/80, pulse 73, temperature 97.9 F (36.6 C), height 5\' 3"  (1.6 m), weight 248 lb (112.5 kg), last menstrual period 09/15/2016, SpO2 97 %. Body mass index is 43.93 kg/m.  General: Cooperative, alert, well developed, in no acute distress. HEENT: Conjunctivae and lids unremarkable. Cardiovascular: Regular rhythm.  Lungs: Normal work of breathing. Neurologic: No focal deficits.   Lab Results  Component Value Date   CREATININE 0.53 07/26/2020   BUN 17 07/26/2020   NA 140 07/26/2020   K 4.5 07/26/2020   CL 103 07/26/2020   CO2 30 07/26/2020   Lab Results  Component Value Date   ALT 29 07/26/2020   AST 16 07/26/2020   ALKPHOS 78 06/08/2020   BILITOT 0.3 07/26/2020   Lab Results  Component Value Date   HGBA1C 5.7 (H) 07/26/2020   HGBA1C 5.8 (H) 06/08/2020   HGBA1C 7.1 (H) 02/25/2020   HGBA1C 6.9 (H) 11/05/2019   HGBA1C 6.8 (H) 07/24/2019   Lab Results  Component Value Date   INSULIN 8.8 06/08/2020  INSULIN 13.7 02/25/2020   Lab Results  Component Value Date   TSH 2.510 02/25/2020   Lab Results  Component Value Date   CHOL 227 (H) 07/26/2020   HDL 53 07/26/2020   LDLCALC 156 (H) 07/26/2020   LDLDIRECT 144.2 07/20/2013   TRIG 77 07/26/2020   CHOLHDL 4.3 07/26/2020   Lab Results  Component Value Date   WBC 7.7 07/26/2020   HGB 14.0 07/26/2020   HCT 42.0 07/26/2020   MCV 96.8 07/26/2020   PLT 358 07/26/2020   Lab Results  Component Value Date   FERRITIN 32 03/26/2016   Attestation Statements:   Reviewed by clinician on day of visit:  allergies, medications, problem list, medical history, surgical history, family history, social history, and previous encounter notes.  Time spent on visit including pre-visit chart review and post-visit care and charting was 31 minutes.   I, Water quality scientist, CMA, am acting as Location manager for [employer], [place].  I have reviewed the above documentation for accuracy and completeness, and I agree with the above. -  Georgianne Fick, FNP

## 2021-01-10 ENCOUNTER — Encounter (INDEPENDENT_AMBULATORY_CARE_PROVIDER_SITE_OTHER): Payer: Self-pay | Admitting: Family Medicine

## 2021-01-10 ENCOUNTER — Ambulatory Visit (INDEPENDENT_AMBULATORY_CARE_PROVIDER_SITE_OTHER): Payer: BC Managed Care – PPO | Admitting: Family Medicine

## 2021-01-10 ENCOUNTER — Other Ambulatory Visit: Payer: Self-pay

## 2021-01-10 VITALS — BP 127/74 | HR 56 | Temp 97.6°F | Ht 63.0 in | Wt 243.0 lb

## 2021-01-10 DIAGNOSIS — I10 Essential (primary) hypertension: Secondary | ICD-10-CM

## 2021-01-10 DIAGNOSIS — I152 Hypertension secondary to endocrine disorders: Secondary | ICD-10-CM | POA: Diagnosis not present

## 2021-01-10 DIAGNOSIS — E1159 Type 2 diabetes mellitus with other circulatory complications: Secondary | ICD-10-CM | POA: Diagnosis not present

## 2021-01-10 DIAGNOSIS — Z6841 Body Mass Index (BMI) 40.0 and over, adult: Secondary | ICD-10-CM

## 2021-01-10 DIAGNOSIS — E559 Vitamin D deficiency, unspecified: Secondary | ICD-10-CM

## 2021-01-10 MED ORDER — VITAMIN D (ERGOCALCIFEROL) 1.25 MG (50000 UNIT) PO CAPS: 50000.0000 [IU] | ORAL_CAPSULE | ORAL | 0 refills | Status: DC

## 2021-01-12 NOTE — Progress Notes (Signed)
Chief Complaint:   OBESITY Rachel Norris is here to discuss her progress with her obesity treatment plan along with follow-up of her obesity related diagnoses. Rachel Norris is on the Category 3 Plan and states she is following her eating plan approximately 98% of the time. Rachel Norris states she is going to gym 2 hours 7 times per week.  Today's visit was #: 58 Starting weight: 319 lbs Starting date: 02/25/2020 Today's weight: 243 lbs Today's date: 01/10/2021 Total lbs lost to date: 76 lbs Total lbs lost since last in-office visit: 5 lbs  Interim History: Rachel Norris is going to gym daily for 2 hours. She reports doing very well on the plan during Christmas. She gave away leftovers. She has lost 76 lbs since February 2021. She is very consistent with her eating and exercise.  Subjective:   1. Vitamin D deficiency Rachel Norris's Vitamin D level is at goal (50.3). She is currently taking prescription vitamin D 50,000 IU each week.   2. Essential hypertension  Blood pressure is well controlled with HCTZ and losartan.  BP Readings from Last 3 Encounters:  01/10/21 127/74  12/20/20 129/80  11/28/20 121/71     Assessment/Plan:   1. Vitamin D deficiency . She agrees to continue to take prescription Vitamin D @50 ,000 IU every week and will follow-up for routine testing of Vitamin D, at least 2-3 times per year to avoid over-replacement. Will refill Vitamin D 50,000 unit weekly # 4. No refill. - Vitamin D, Ergocalciferol, (DRISDOL) 1.25 MG (50000 UNIT) CAPS capsule; Take 1 capsule (50,000 Units total) by mouth every 7 (seven) days.  Dispense: 12 capsule; Refill: 0  2. Essential hypertension We will watch for signs of hypotension as she continues her lifestyle modifications. Rachel Norris will continue all medications. BP Readings from Last 3 Encounters:  01/10/21 127/74  12/20/20 129/80  11/28/20 121/71     3. Class 3 severe obesity with serious comorbidity and body mass index (BMI) of 40.0 to 44.9 in adult,  unspecified obesity type Rachel Norris)   Rachel Norris is currently in the action stage of change. As such, her goal is to continue with weight loss efforts. She has agreed to the Category 3 Plan.   Exercise goals: As is.  Behavioral modification strategies: planning for success.  Rachel Norris has agreed to follow-up with our clinic in 3 weeks.   Objective:   Blood pressure 127/74, pulse (!) 56, temperature 97.6 F (36.4 C), height 5\' 3"  (1.6 m), weight 243 lb (110.2 kg), last menstrual period 09/15/2016, SpO2 97 %. Body mass index is 43.05 kg/m.  General: Cooperative, alert, well developed, in no acute distress. HEENT: Conjunctivae and lids unremarkable. Cardiovascular: Regular rhythm.  Lungs: Normal work of breathing. Neurologic: No focal deficits.   Lab Results  Component Value Date   CREATININE 0.53 07/26/2020   BUN 17 07/26/2020   NA 140 07/26/2020   K 4.5 07/26/2020   CL 103 07/26/2020   CO2 30 07/26/2020   Lab Results  Component Value Date   ALT 29 07/26/2020   AST 16 07/26/2020   ALKPHOS 78 06/08/2020   BILITOT 0.3 07/26/2020   Lab Results  Component Value Date   HGBA1C 5.7 (H) 07/26/2020   HGBA1C 5.8 (H) 06/08/2020   HGBA1C 7.1 (H) 02/25/2020   HGBA1C 6.9 (H) 11/05/2019   HGBA1C 6.8 (H) 07/24/2019   Lab Results  Component Value Date   INSULIN 8.8 06/08/2020   INSULIN 13.7 02/25/2020   Lab Results  Component Value Date  TSH 2.510 02/25/2020   Lab Results  Component Value Date   CHOL 227 (H) 07/26/2020   HDL 53 07/26/2020   LDLCALC 156 (H) 07/26/2020   LDLDIRECT 144.2 07/20/2013   TRIG 77 07/26/2020   CHOLHDL 4.3 07/26/2020   Lab Results  Component Value Date   WBC 7.7 07/26/2020   HGB 14.0 07/26/2020   HCT 42.0 07/26/2020   MCV 96.8 07/26/2020   PLT 358 07/26/2020   Lab Results  Component Value Date   FERRITIN 32 03/26/2016     Attestation Statements:   Reviewed by clinician on day of visit: allergies, medications, problem list, medical history,  surgical history, family history, social history, and previous encounter notes.  Tula Nakayama, am acting as Location manager for Georgianne Fick, FNP.  I have reviewed the above documentation for accuracy and completeness, and I agree with the above. -  Georgianne Fick, FNP

## 2021-01-15 ENCOUNTER — Encounter (INDEPENDENT_AMBULATORY_CARE_PROVIDER_SITE_OTHER): Payer: Self-pay | Admitting: Family Medicine

## 2021-01-25 ENCOUNTER — Other Ambulatory Visit: Payer: Self-pay

## 2021-01-25 NOTE — Patient Instructions (Addendum)
    Blood work was ordered.      Medications changes include :   none     Please followup in 6 months  

## 2021-01-25 NOTE — Progress Notes (Signed)
Subjective:    Patient ID: Rachel Norris, female    DOB: 1961/08/29, 59 y.o.   MRN: [id number]  HPI The patient is here for follow up of their chronic medical problems, including htn, gerd, DM, hyperlipidemia   She does cardio every day.  She does weights.  She continues to follow at the [facility] clinic and is losing weight.  She is eating healthy diet and controlling her portions   Medications and allergies reviewed with patient and updated if appropriate.  Patient Active Problem List   Diagnosis Date Noted   SCC (squamous cell carcinoma) 07/26/2020   Knee osteoarthritis 07/26/2020   Hyperlipidemia associated with type 2 diabetes mellitus ([place]) 06/09/2020   Vitamin D deficiency 04/28/2020   Acute medial meniscus tear of left knee 11/09/2019   Acute lateral meniscus tear of left knee [id number]   Synovial plica of knee, left [id number]   Chondromalacia, left knee 11/09/2019   Seasonal and perennial allergic rhinitis 08/01/2018   Diabetes ([place]) 07/27/2013   Class 3 severe obesity with serious comorbidity and body mass index (BMI) of 40.0 to 44.9 in adult [facility]) 08/29/2009   Asthma 06/22/2008   Hypertension associated with type 2 diabetes mellitus ([place]) 03/07/2007    Current Outpatient Medications on File Prior to Visit  Medication Sig Dispense Refill   acetaminophen (TYLENOL) 650 MG CR tablet Take 1,300 mg by mouth every 8 (eight) hours as needed for pain.     albuterol (VENTOLIN HFA) 108 (90 Base) MCG/ACT inhaler Inhale 2 puffs into the lungs every 4 (four) hours as needed for wheezing or shortness of breath.      Ascorbic Acid (VITAMIN C) 1000 MG tablet Take 1,000 mg by mouth daily.     b complex vitamins capsule Take 1 capsule by mouth daily.     diphenhydrAMINE HCl, Sleep, (UNISOM SLEEPGELS) 50 MG CAPS Take 50 mg by mouth at bedtime as needed (sleep).     fexofenadine (ALLEGRA) 180 MG tablet Take 180 mg by mouth daily as needed for allergies or  rhinitis.     fluticasone (FLONASE) 50 MCG/ACT nasal spray Place 2 sprays into both nostrils daily. (Patient taking differently: Place 2 sprays into both nostrils daily as needed for allergies.) 16 g 0   glucose blood (ACCU-CHEK GUIDE) test strip USE TO CHECK BLOOD SUGAR DAILY 100 strip 12   hydrochlorothiazide (HYDRODIURIL) 25 MG tablet Take 1 tablet (25 mg total) by mouth daily. 90 tablet 2   hydrocortisone 2.5 % ointment Apply topically.     Lancets (ACCU-CHEK SOFT TOUCH) lancets CHECK BLOOD SUGAR TWICE DAILY 180 each 1   losartan (COZAAR) 100 MG tablet Take 1 tablet (100 mg total) by mouth daily. 90 tablet 2   Menthol, Topical Analgesic, (BIOFREEZE EX) Apply 1 application topically daily as needed (knee pain).     Multiple Vitamins-Minerals (HAIR SKIN AND NAILS FORMULA PO) Take by mouth.     Specialty Vitamins Products (VITAMINS FOR HAIR) TABS Take by mouth daily. Nature bounty     Vitamin D, Ergocalciferol, (DRISDOL) 1.25 MG (50000 UNIT) CAPS capsule Take 1 capsule (50,000 Units total) by mouth every 7 (seven) days. 12 capsule 0   No current facility-administered medications on file prior to visit.    Past Medical History:  Diagnosis Date   Allergic rhinitis    Ankle swelling    Anxiety    Anxiety and depression    Arthritis    generalized arthritis -knees, feet.back.    Asthma  Dx 2009-"granuloma on the lung"   At risk for sleep apnea    STOP-BANG= 5            SENT TO PCP 04-05-2015   Back pain    Bronchitis    Bronchitis a few months ago- no issues now. - no Inhalers used daily.   De Quervain's tenosynovitis, right    Depression    Diabetes mellitus without complication ([place])    "pre diabetes"   Edema, lower extremity    Fibroid    GERD (gastroesophageal reflux disease)    Heart palpitations    Hormone disorder    Hypertension    Irregular menstrual bleeding    Joint pain    Labial cyst    bilateral icclusion   Left knee pain     Obesity    Palpitations    Perimenopausal    Plantar fasciitis, bilateral    PONV (postoperative nausea and vomiting)    Pre-diabetes    Prediabetes    Shortness of breath dyspnea    with exertion, climbing a flight of stairs   Tendonitis, Achilles, left    Thickened endometrium    Wears glasses     Past Surgical History:  Procedure Laterality Date   COLONOSCOPY WITH PROPOFOL N/A 05/15/2016   Procedure: COLONOSCOPY WITH PROPOFOL;  Surgeon: Mauri Pole, MD;  Location: [facility];  Service: Endoscopy;  Laterality: N/A;   DILATATION & CURETTAGE/HYSTEROSCOPY WITH MYOSURE N/A 08/30/2016   Procedure: DILATATION & CURETTAGE/HYSTEROSCOPY WITH MYOSURE;  Surgeon: Salvadore Dom, MD;  Location: [facility];  Service: Gynecology;  Laterality: N/A;  hysteroscopic myomectomy. BMI 53.8   DILATION AND CURETTAGE OF UTERUS     EAR CYST EXCISION Bilateral 04/08/2015   Procedure: removal of bilateral labial inclusion cysts;  Surgeon: Linda Hedges, DO;  Location: [facility];  Service: Gynecology;  Laterality: Bilateral;   HYSTEROSCOPY     HYSTEROSCOPY WITH D & C N/A 04/08/2015   Procedure: DILATATION AND CURETTAGE /HYSTEROSCOPY, removal of bilateral labial inclusion cysts;  Surgeon: Linda Hedges, DO;  Location: [place];  Service: Gynecology;  Laterality: N/A;   KNEE ARTHROSCOPY WITH MEDIAL MENISECTOMY Left 11/09/2019   Procedure: ARTHROSCOPY KNEE WITH PARTIAL MEDIAL MENISECTOMY, PARTIAL LATERAL MENISECTOMY, MEDIAL CHONDROPLASTY, MEDIAL PLICA EXCISION AND PATELLA FEMORAL CHONDROPLASTY;  Surgeon: Dorna Leitz, MD;  Location: [place];  Service: Orthopedics;  Laterality: Left;   LASER ABLATION OF THE CERVIX  1992   DYSPLAGIA   MYOMECTOMY N/A 08/30/2016   Procedure: HYSTEROSCOPIC MYOMECTOMY;  Surgeon: Salvadore Dom, MD;  Location: [facility];  Service: Gynecology;  Laterality: N/A;   VIDEO ASSISTED THORACOSCOPY (VATS)/THOROCOTOMY Right 07-01-2008   dr  gerhardt   w/ 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)    Social History   Socioeconomic History   Marital status: Married    Spouse name: Not on file   Number of children: 0   Years of education: Not on file   Highest education level: Not on file  Occupational History   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[place]: [place]  Tobacco Use   Smoking status: Former Smoker    Packs/day: 1.00    Years: 20.00    Pack years: 20.00    Types: Cigarettes    Quit date: 04/04/1996    Years since quitting: 24.8   Smokeless tobacco: Never Used  Vaping Use   Vaping Use: Never used  Substance and Sexual  Activity   Alcohol use: Yes    Alcohol/week: 0.0 standard drinks    Comment: rarely   Drug use: No   Sexual activity: Not Currently    Partners: Male    Birth control/protection: Post-menopausal  Other Topics Concern   Not on file  Social History Narrative   Lives w/ husband    Retired 10-2019   Social Determinants of Radio broadcast assistant Strain: Not on file  Food Insecurity: Not on file  Transportation Needs: Not on file  Physical Activity: Not on file  Stress: Not on file  Social Connections: Not on file    Family History  Problem Relation Age of Onset   Diabetes Mother        M anmd others   Dementia Mother    Stroke Mother    Anxiety disorder Mother    Stroke Father    AAA (abdominal aortic aneurysm) Father    Hypertension Father    Hyperlipidemia Father    Heart disease Father    Atrial fibrillation Sister    Asthma Sister    Allergic rhinitis Sister    Bronchitis Sister    Angioedema Sister    Allergic rhinitis Brother    Asthma Other    Allergic rhinitis Sister    Bronchitis Sister    Colon cancer Neg Hx    Breast cancer Neg Hx    Coronary artery disease Neg Hx    Eczema Neg Hx    Urticaria Neg Hx    Immunodeficiency Neg Hx     Review of Systems   Constitutional: Negative for fever.       Hotflashes  Respiratory: Negative for cough, shortness of breath and wheezing.   Cardiovascular: Positive for palpitations (occ). Negative for chest pain and leg swelling.  Genitourinary: Positive for vaginal bleeding.  Neurological: Negative for light-headedness and headaches.       Objective:   Vitals:   01/26/21 1105  BP: 126/74  Pulse: 64  Temp: 97.9 F (36.6 C)  SpO2: 99%   BP Readings from Last 3 Encounters:  01/26/21 126/74  01/10/21 127/74  12/20/20 129/80   Wt Readings from Last 3 Encounters:  01/26/21 247 lb 12.8 oz (112.4 kg)  01/10/21 243 lb (110.2 kg)  12/20/20 248 lb (112.5 kg)   Body mass index is 43.9 kg/m.   Physical Exam    Constitutional: Appears well-developed and well-nourished. No distress.  HENT:  Head: Normocephalic and atraumatic.  Neck: Neck supple. No tracheal deviation present. No thyromegaly present.  No cervical lymphadenopathy Cardiovascular: Normal rate, regular rhythm and normal heart sounds.   No murmur heard. No carotid bruit .  No edema Pulmonary/Chest: Effort normal and breath sounds normal. No respiratory distress. No has no wheezes. No rales.  Skin: Skin is warm and dry. Not diaphoretic.  Psychiatric: Normal mood and affect. Behavior is normal.      Assessment & Plan:    See Problem List for Assessment and Plan of chronic medical problems.    This visit occurred during the SARS-CoV-2 public health emergency.  Safety protocols were in place, including screening questions prior to the visit, additional usage of staff PPE, and extensive cleaning of exam room while observing appropriate contact time as indicated for disinfecting solutions.

## 2021-01-26 ENCOUNTER — Other Ambulatory Visit: Payer: Self-pay

## 2021-01-26 ENCOUNTER — Encounter: Payer: Self-pay | Admitting: Internal Medicine

## 2021-01-26 ENCOUNTER — Ambulatory Visit: Payer: BC Managed Care – PPO | Admitting: Internal Medicine

## 2021-01-26 VITALS — BP 126/74 | HR 64 | Temp 97.9°F | Ht 63.0 in | Wt 247.8 lb

## 2021-01-26 DIAGNOSIS — E1169 Type 2 diabetes mellitus with other specified complication: Secondary | ICD-10-CM | POA: Diagnosis not present

## 2021-01-26 DIAGNOSIS — E785 Hyperlipidemia, unspecified: Secondary | ICD-10-CM

## 2021-01-26 DIAGNOSIS — I152 Hypertension secondary to endocrine disorders: Secondary | ICD-10-CM

## 2021-01-26 DIAGNOSIS — E559 Vitamin D deficiency, unspecified: Secondary | ICD-10-CM

## 2021-01-26 DIAGNOSIS — E1159 Type 2 diabetes mellitus with other circulatory complications: Secondary | ICD-10-CM

## 2021-01-26 DIAGNOSIS — R232 Flushing: Secondary | ICD-10-CM | POA: Insufficient documentation

## 2021-01-26 LAB — LIPID PANEL
Cholesterol: 241 mg/dL — ABNORMAL HIGH (ref 0–200)
HDL: 58.4 mg/dL (ref >39.00)
LDL Cholesterol: 169 mg/dL — ABNORMAL HIGH (ref 0–99)
NonHDL: 182.59
Total CHOL/HDL Ratio: 4
Triglycerides: 69 mg/dL (ref 0.0–149.0)
VLDL: 13.8 mg/dL (ref 0.0–40.0)

## 2021-01-26 LAB — COMPREHENSIVE METABOLIC PANEL
ALT: 16 U/L (ref 0–35)
AST: 15 U/L (ref 0–37)
Albumin: 4.4 g/dL (ref 3.5–5.2)
Alkaline Phosphatase: 48 U/L (ref 39–117)
BUN: 14 mg/dL (ref 6–23)
CO2: 30 mEq/L (ref 19–32)
Calcium: 10.1 mg/dL (ref 8.4–10.5)
Chloride: 101 mEq/L (ref 96–112)
Creatinine, Ser: 0.54 mg/dL (ref 0.40–1.20)
GFR: 101.73 mL/min (ref >60.00)
Glucose, Bld: 96 mg/dL (ref 70–99)
Potassium: 4.1 mEq/L (ref 3.5–5.1)
Sodium: 138 mEq/L (ref 135–145)
Total Bilirubin: 0.4 mg/dL (ref 0.2–1.2)
Total Protein: 7.3 g/dL (ref 6.0–8.3)

## 2021-01-26 LAB — TSH: TSH: 2.8 u[IU]/mL (ref 0.35–4.50)

## 2021-01-26 LAB — VITAMIN D 25 HYDROXY (VIT D DEFICIENCY, FRACTURES): VITD: 65.45 ng/mL (ref 30.00–100.00)

## 2021-01-26 LAB — HEMOGLOBIN A1C: Hgb A1c MFr Bld: 5.7 % (ref 4.6–6.5)

## 2021-01-26 MED ORDER — METFORMIN HCL 500 MG PO TABS: ORAL_TABLET | ORAL | 1 refills | Status: DC

## 2021-01-26 MED ORDER — OMEPRAZOLE 40 MG PO CPDR: 40.0000 mg | DELAYED_RELEASE_CAPSULE | Freq: Every day | ORAL | 3 refills | Status: DC | PRN

## 2021-01-26 NOTE — Assessment & Plan Note (Signed)
Chronic Taking vitamin D weekly Check vitamin D level

## 2021-01-26 NOTE — Assessment & Plan Note (Signed)
Chronic Continue metformin 1000 mg am and 500 mg pm Check a1c Low sugar / carb diet Continue regular exercise and weight loss efforts

## 2021-01-26 NOTE — Assessment & Plan Note (Signed)
Chronic Check lipid panel  Lifestyle controlled.   Sugar briefly in diabetic range and she has been in the prediabetic range for a good amount of time May need to consider a statin in future but will monitor for now Continue Regular exercise and healthy diet  Continue weight loss efforts

## 2021-01-26 NOTE — Assessment & Plan Note (Signed)
Chronic BP well controlled Continue losatan 100 mg daily, hctz 25 mg daily cmp

## 2021-01-26 NOTE — Assessment & Plan Note (Signed)
Acute Had them initially when she went through menopause, but they went away and recently have returned Will check tsh to r/o thyroid issues

## 2021-01-30 ENCOUNTER — Other Ambulatory Visit: Payer: Self-pay

## 2021-01-30 ENCOUNTER — Encounter (INDEPENDENT_AMBULATORY_CARE_PROVIDER_SITE_OTHER): Payer: Self-pay | Admitting: Family Medicine

## 2021-01-30 ENCOUNTER — Ambulatory Visit (INDEPENDENT_AMBULATORY_CARE_PROVIDER_SITE_OTHER): Payer: BC Managed Care – PPO | Admitting: Family Medicine

## 2021-01-30 VITALS — BP 109/69 | HR 59 | Temp 98.3°F | Ht 63.0 in | Wt 242.0 lb

## 2021-01-30 DIAGNOSIS — E785 Hyperlipidemia, unspecified: Secondary | ICD-10-CM

## 2021-01-30 DIAGNOSIS — E1169 Type 2 diabetes mellitus with other specified complication: Secondary | ICD-10-CM

## 2021-01-30 DIAGNOSIS — Z9189 Other specified personal risk factors, not elsewhere classified: Secondary | ICD-10-CM | POA: Diagnosis not present

## 2021-01-30 DIAGNOSIS — E559 Vitamin D deficiency, unspecified: Secondary | ICD-10-CM | POA: Diagnosis not present

## 2021-01-30 DIAGNOSIS — Z6841 Body Mass Index (BMI) 40.0 and over, adult: Secondary | ICD-10-CM

## 2021-01-30 MED ORDER — VITAMIN D (ERGOCALCIFEROL) 1.25 MG (50000 UNIT) PO CAPS: 50000.0000 [IU] | ORAL_CAPSULE | ORAL | 0 refills | Status: DC

## 2021-01-30 NOTE — Progress Notes (Signed)
Chief Complaint:   OBESITY Rachel Norris is here to discuss her progress with her obesity treatment plan along with follow-up of her obesity related diagnoses. Solstice is on the Category 3 Plan and states she is following her eating plan approximately 98% of the time. Destina states she is doing cardio for 1.5-2 hours 5-6 times per week.   Today's visit was #: 18 Starting weight: 319 lbs Starting date: 02/25/2020 Today's weight: 242 lbs Today's date: 01/30/2021 Total lbs lost to date: 77 Total lbs lost since last in-office visit: 1  Interim History: [name] says she has not exercising as much due to bad weather. She is adhering to the plan very well. She has been drinking water with lemon helps with hunger. She is down a total of 77 lbs today.  Subjective:   1. Hyperlipidemia associated with type 2 diabetes mellitus (Rachel Norris) Astraea's ASCVD risk score is 5.6%. Her last LDL increased to 169 but her HDL has increased also. She is not on statin despite being diabteic. Her primary care physician is monitoring and will reassess in July per the patient.   Lab Results  Component Value Date   ALT 16 01/26/2021   AST 15 01/26/2021   ALKPHOS 48 01/26/2021   BILITOT 0.4 01/26/2021   Lab Results  Component Value Date   CHOL 241 (H) 01/26/2021   HDL 58.40 01/26/2021   LDLCALC 169 (H) 01/26/2021   LDLDIRECT 144.2 07/20/2013   TRIG 69.0 01/26/2021   CHOLHDL 4 01/26/2021   2. Vitamin D deficiency Rachel Norris's last Vit D level was at goal. She is on weekly prescription Vit D.  3. At risk for heart disease Rachel Norris is at a higher than average risk for cardiovascular disease due to obesity, hyperlipidemia, and diabetes mellitus.   Assessment/Plan:   1. Hyperlipidemia associated with type 2 diabetes mellitus ([place]) Cardiovascular risk and specific lipid/LDL goals reviewed. We discussed several lifestyle modifications today. Caleen will continue her meal plan, and will continue to work on exercise and weight loss  efforts.  2. Vitamin D deficiency  We will refill prescription Vitamin D for 90 days with no refills. Marlinda will follow-up for routine testing of Vitamin D, at least 2-3 times per year to avoid over-replacement.  3. At risk for heart disease [name] was given approximately 15 minutes of coronary artery disease prevention counseling today. She is 60 y.o. female and has risk factors for heart disease including obesity. We discussed intensive lifestyle modifications today with an emphasis on specific weight loss instructions and strategies.   Repetitive spaced learning was employed today to elicit superior memory formation and behavioral change.  4. Class 3 severe obesity with serious comorbidity and body mass index (BMI) of 40.0 to 44.9 in adult, unspecified obesity type [facility]) Rachel Norris is currently in the action stage of change. As such, her goal is to continue with weight loss efforts. She has agreed to the Category 3 Plan.   Exercise goals: As is.  Behavioral modification strategies: better snacking choices and planning for success.  Rachel Norris has agreed to follow-up with our clinic in 3 weeks.  Objective:   Blood pressure 109/69, pulse (!) 59, temperature 98.3 F (36.8 C), height 5\' 3"  (1.6 m), weight 242 lb (109.8 kg), last menstrual period 09/15/2016, SpO2 100 %. Body mass index is 42.87 kg/m.  General: Cooperative, alert, well developed, in no acute distress. HEENT: Conjunctivae and lids unremarkable. Cardiovascular: Regular rhythm.  Lungs: Normal work of breathing. Neurologic: No focal deficits.  Lab Results  Component Value Date   CREATININE 0.54 01/26/2021   BUN 14 01/26/2021   NA 138 01/26/2021   K 4.1 01/26/2021   CL 101 01/26/2021   CO2 30 01/26/2021   Lab Results  Component Value Date   ALT 16 01/26/2021   AST 15 01/26/2021   ALKPHOS 48 01/26/2021   BILITOT 0.4 01/26/2021   Lab Results  Component Value Date   HGBA1C 5.7 01/26/2021   HGBA1C 5.7 (H) 07/26/2020    HGBA1C 5.8 (H) 06/08/2020   HGBA1C 7.1 (H) 02/25/2020   HGBA1C 6.9 (H) 11/05/2019   Lab Results  Component Value Date   INSULIN 8.8 06/08/2020   INSULIN 13.7 02/25/2020   Lab Results  Component Value Date   TSH 2.80 01/26/2021   Lab Results  Component Value Date   CHOL 241 (H) 01/26/2021   HDL 58.40 01/26/2021   LDLCALC 169 (H) 01/26/2021   LDLDIRECT 144.2 07/20/2013   TRIG 69.0 01/26/2021   CHOLHDL 4 01/26/2021   Lab Results  Component Value Date   WBC 7.7 07/26/2020   HGB 14.0 07/26/2020   HCT 42.0 07/26/2020   MCV 96.8 07/26/2020   PLT 358 07/26/2020   Lab Results  Component Value Date   FERRITIN 32 03/26/2016   Attestation Statements:   Reviewed by clinician on day of visit: allergies, medications, problem list, medical history, surgical history, family history, social history, and previous encounter notes.   Wilhemena Durie, am acting as Location manager for [employer], FNP-C.  I have reviewed the above documentation for accuracy and completeness, and I agree with the above. -  Georgianne Fick, FNP

## 2021-01-30 NOTE — Progress Notes (Signed)
The 10-year ASCVD risk score Mikey Bussing DC Brooke Bonito., et al., 2013) is: 5.6%   Values used to calculate the score:     Age: 60 years     Sex: Female     Is Non-Hispanic African American: No     Diabetic: Yes     Tobacco smoker: No     Systolic Blood Pressure: 657 mmHg     Is BP treated: Yes     HDL Cholesterol: 58.4 mg/dL     Total Cholesterol: 241 mg/dL

## 2021-01-31 LAB — HM DIABETES EYE EXAM

## 2021-02-03 ENCOUNTER — Encounter: Payer: Self-pay | Admitting: Internal Medicine

## 2021-02-03 NOTE — Progress Notes (Signed)
Outside notes received. Information abstracted. Notes sent to scan.  

## 2021-02-08 ENCOUNTER — Other Ambulatory Visit (HOSPITAL_BASED_OUTPATIENT_CLINIC_OR_DEPARTMENT_OTHER): Payer: Self-pay | Admitting: Internal Medicine

## 2021-02-08 DIAGNOSIS — Z1231 Encounter for screening mammogram for malignant neoplasm of breast: Secondary | ICD-10-CM

## 2021-02-27 ENCOUNTER — Encounter (HOSPITAL_BASED_OUTPATIENT_CLINIC_OR_DEPARTMENT_OTHER): Payer: Self-pay

## 2021-02-27 ENCOUNTER — Other Ambulatory Visit: Payer: Self-pay

## 2021-02-27 ENCOUNTER — Ambulatory Visit (HOSPITAL_BASED_OUTPATIENT_CLINIC_OR_DEPARTMENT_OTHER)
Admission: RE | Admit: 2021-02-27 | Discharge: 2021-02-27 | Disposition: A | Discharge status: Home / Self Care | Payer: BC Managed Care – PPO | Source: Ambulatory Visit | Attending: Internal Medicine | Admitting: Internal Medicine

## 2021-02-27 DIAGNOSIS — Z1231 Encounter for screening mammogram for malignant neoplasm of breast: Secondary | ICD-10-CM | POA: Diagnosis not present

## 2021-03-01 DIAGNOSIS — M65351 Trigger finger, right little finger: Secondary | ICD-10-CM | POA: Insufficient documentation

## 2021-03-02 ENCOUNTER — Ambulatory Visit (INDEPENDENT_AMBULATORY_CARE_PROVIDER_SITE_OTHER): Payer: BC Managed Care – PPO | Admitting: Family Medicine

## 2021-03-07 ENCOUNTER — Ambulatory Visit (INDEPENDENT_AMBULATORY_CARE_PROVIDER_SITE_OTHER): Payer: BC Managed Care – PPO | Admitting: Family Medicine

## 2021-03-16 ENCOUNTER — Encounter (INDEPENDENT_AMBULATORY_CARE_PROVIDER_SITE_OTHER): Payer: Self-pay | Admitting: Family Medicine

## 2021-03-16 ENCOUNTER — Ambulatory Visit (INDEPENDENT_AMBULATORY_CARE_PROVIDER_SITE_OTHER): Payer: BC Managed Care – PPO | Admitting: Family Medicine

## 2021-03-16 ENCOUNTER — Other Ambulatory Visit: Payer: Self-pay

## 2021-03-16 VITALS — BP 123/85 | HR 70 | Temp 98.0°F | Ht 63.0 in | Wt 241.0 lb

## 2021-03-16 DIAGNOSIS — Z6841 Body Mass Index (BMI) 40.0 and over, adult: Secondary | ICD-10-CM | POA: Diagnosis not present

## 2021-03-16 DIAGNOSIS — E1169 Type 2 diabetes mellitus with other specified complication: Secondary | ICD-10-CM | POA: Diagnosis not present

## 2021-03-20 ENCOUNTER — Encounter (INDEPENDENT_AMBULATORY_CARE_PROVIDER_SITE_OTHER): Payer: Self-pay | Admitting: Family Medicine

## 2021-03-20 NOTE — Progress Notes (Signed)
Chief Complaint:   OBESITY Rachel Norris is here to discuss her progress with her obesity treatment plan along with follow-up of her obesity related diagnoses. [name] is on the Category 3 Plan and states she is following her eating plan approximately 96% of the time. [name] states she is going to the gym and circuit training 120 minutes 4-5 times per week.  Today's visit was #: 91 Starting weight: 319 lbs Starting date: 02/25/2020 Today's weight: 241 lbs Today's date: 03/16/2021 Total lbs lost to date: 78 lbs Total lbs lost since last in-office visit: 1 lb  Interim History: Rachel Norris has not had an OV for 6 weeks and she is down 1 lb today. She feels she has lost fat and gained muscle. She is doing more resistance training. She made good food choices on her recent trip. Her weight is plateaued. She is down 78 lbs overall. She denies excessive hunger.  Subjective:   1. Type 2 diabetes mellitus with other specified complication, without long-term current use of insulin (HCC) Rachel Norris's diabetes is controlled. Her A1c is 5.7. she is on Metformin. She is not on statin therapy, per PCP discretion.  Lab Results  Component Value Date   HGBA1C 5.7 01/26/2021   HGBA1C 5.7 (H) 07/26/2020   HGBA1C 5.8 (H) 06/08/2020   Lab Results  Component Value Date   LDLCALC 169 (H) 01/26/2021   CREATININE 0.54 01/26/2021   Lab Results  Component Value Date   INSULIN 8.8 06/08/2020   INSULIN 13.7 02/25/2020    Assessment/Plan:   1. Type 2 diabetes mellitus with other specified complication, without long-term current use of insulin (HCC)  Continue Metformin.  2. Class 3 severe obesity with serious comorbidity and body mass index (BMI) of 40.0 to 44.9 in adult, unspecified obesity type [name]) [name] is currently in the action stage of change. As such, her goal is to continue with weight loss efforts. She has agreed to the Category 3 Plan.   Handout: Recipe= Ricotta Dessert  Exercise goals: As is  Behavioral  modification strategies: meal planning and cooking strategies and planning for success.  Rachel Norris has agreed to follow-up with our clinic in 3-4 weeks.   Objective:   Blood pressure 123/85, pulse 70, temperature 98 F (36.7 C), height 5\' 3"  (1.6 m), weight 241 lb (109.3 kg), last menstrual period 09/15/2016, SpO2 94 %. Body mass index is 42.69 kg/m.  General: Cooperative, alert, well developed, in no acute distress. HEENT: Conjunctivae and lids unremarkable. Cardiovascular: Regular rhythm.  Lungs: Normal work of breathing. Neurologic: No focal deficits.   Lab Results  Component Value Date   CREATININE 0.54 01/26/2021   BUN 14 01/26/2021   NA 138 01/26/2021   K 4.1 01/26/2021   CL 101 01/26/2021   CO2 30 01/26/2021   Lab Results  Component Value Date   ALT 16 01/26/2021   AST 15 01/26/2021   ALKPHOS 48 01/26/2021   BILITOT 0.4 01/26/2021   Lab Results  Component Value Date   HGBA1C 5.7 01/26/2021   HGBA1C 5.7 (H) 07/26/2020   HGBA1C 5.8 (H) 06/08/2020   HGBA1C 7.1 (H) 02/25/2020   HGBA1C 6.9 (H) 11/05/2019   Lab Results  Component Value Date   INSULIN 8.8 06/08/2020   INSULIN 13.7 02/25/2020   Lab Results  Component Value Date   TSH 2.80 01/26/2021   Lab Results  Component Value Date   CHOL 241 (H) 01/26/2021   HDL 58.40 01/26/2021   LDLCALC 169 (H) 01/26/2021  LDLDIRECT 144.2 07/20/2013   TRIG 69.0 01/26/2021   CHOLHDL 4 01/26/2021   Lab Results  Component Value Date   WBC 7.7 07/26/2020   HGB 14.0 07/26/2020   HCT 42.0 07/26/2020   MCV 96.8 07/26/2020   PLT 358 07/26/2020   Lab Results  Component Value Date   FERRITIN 32 03/26/2016     Attestation Statements:   Reviewed by clinician on day of visit: allergies, medications, problem list, medical history, surgical history, family history, social history, and previous encounter notes.  Coral Ceo, am acting as Location manager for [employer], [place].  I have reviewed the above  documentation for accuracy and completeness, and I agree with the above. -  Georgianne Fick, FNP

## 2021-04-12 ENCOUNTER — Ambulatory Visit (INDEPENDENT_AMBULATORY_CARE_PROVIDER_SITE_OTHER): Payer: BC Managed Care – PPO | Admitting: Family Medicine

## 2021-04-12 ENCOUNTER — Encounter (INDEPENDENT_AMBULATORY_CARE_PROVIDER_SITE_OTHER): Payer: Self-pay | Admitting: Family Medicine

## 2021-04-12 ENCOUNTER — Other Ambulatory Visit: Payer: Self-pay

## 2021-04-12 VITALS — BP 117/73 | HR 65 | Temp 98.3°F | Ht 63.0 in | Wt 243.0 lb

## 2021-04-12 DIAGNOSIS — Z9189 Other specified personal risk factors, not elsewhere classified: Secondary | ICD-10-CM

## 2021-04-12 DIAGNOSIS — E1159 Type 2 diabetes mellitus with other circulatory complications: Secondary | ICD-10-CM

## 2021-04-12 DIAGNOSIS — E559 Vitamin D deficiency, unspecified: Secondary | ICD-10-CM

## 2021-04-12 DIAGNOSIS — Z6841 Body Mass Index (BMI) 40.0 and over, adult: Secondary | ICD-10-CM

## 2021-04-12 DIAGNOSIS — I152 Hypertension secondary to endocrine disorders: Secondary | ICD-10-CM | POA: Diagnosis not present

## 2021-04-12 MED ORDER — VITAMIN D (ERGOCALCIFEROL) 1.25 MG (50000 UNIT) PO CAPS
50000.0000 [IU] | ORAL_CAPSULE | ORAL | 0 refills | Status: DC
Start: 2021-04-12 — End: 2021-07-28

## 2021-04-13 ENCOUNTER — Other Ambulatory Visit: Payer: Self-pay | Admitting: Internal Medicine

## 2021-04-13 NOTE — Progress Notes (Signed)
Chief Complaint:   OBESITY Rachel Norris is here to discuss her progress with her obesity treatment plan along with follow-up of her obesity related diagnoses. Rachel Norris is on the Category 3 Plan and states she is following her eating plan approximately 95% of the time. Rachel Norris states she is going to the gym 60-120 minutes 4 times per week.  Today's visit was #: 20 Starting weight: 319 lbs Starting date: 02/25/2020 Today's weight: 243 lbs Today's date: 04/12/2021 Total lbs lost to date: 76 lbs Total lbs lost since last in-office visit: +2  Interim History: Rachel Norris recently went on vacation and did have some meals off plan. She is now back in plan and being very strict. She does note more hunger than she used to have. She feels she may not be keeping track of extra calories well.   Subjective:   1. Vitamin D deficiency Rachel Norris's Vit D is at goal and maintained on Vit D every 14 days. She has questions about whether she should take Vit K with Vit D.  2. Hypertension associated with type 2 diabetes mellitus ([place]) Rachel Norris's BP is well controlled with HCTZ and losartan.  BP Readings from Last 3 Encounters:  04/12/21 117/73  03/16/21 123/85  01/30/21 109/69    3. At risk for osteoporosis Rachel Norris is at higher risk of osteopenia and osteoporosis due to Vitamin D deficiency.   Assessment/Plan:   1. Vitamin D deficiency She agrees to continue to take prescription Vitamin D @50 ,000 IU every week and will follow-up for routine testing of Vitamin D, at least 2-3 times per year to avoid over-replacement. Start Vit K daily.  - Vitamin D, Ergocalciferol, (DRISDOL) 1.25 MG (50000 UNIT) CAPS capsule; Take 1 capsule (50,000 Units total) by mouth every 14 (fourteen) days.  Dispense: 6 capsule; Refill: 0  2. Hypertension associated with type 2 diabetes mellitus ([place]) Continue current treatment plan.  3. At risk for osteoporosis Rachel Norris was given approximately 15 minutes of osteoporosis prevention counseling  today. Rachel Norris is at risk for osteopenia and osteoporosis due to her Vitamin D deficiency. She was encouraged to take her Vitamin D and follow her higher calcium diet and increase strengthening exercise to help strengthen her bones and decrease her risk of osteopenia and osteoporosis.  Repetitive spaced learning was employed today to elicit superior memory formation and behavioral change.  4. Obesity : Current BMI 25 Rachel Norris is currently in the action stage of change. As such, her goal is to continue with weight loss efforts. She has agreed to the Category 3 Plan.   She will consider starting Ozempic.  IC at next OV.  Provided breakdown of cal/protein goals for meals  Exercise goals: As is  Behavioral modification strategies: increasing lean protein intake and decreasing simple carbohydrates.  [name] has agreed to follow-up with our clinic in 4 weeks.  Objective:   Blood pressure 117/73, pulse 65, temperature 98.3 F (36.8 C), height 5\' 3"  (1.6 m), weight 243 lb (110.2 kg), last menstrual period 09/15/2016, SpO2 98 %. Body mass index is 43.05 kg/m.  General: Cooperative, alert, well developed, in no acute distress. HEENT: Conjunctivae and lids unremarkable. Cardiovascular: Regular rhythm.  Lungs: Normal work of breathing. Neurologic: No focal deficits.   Lab Results  Component Value Date   CREATININE 0.54 01/26/2021   BUN 14 01/26/2021   NA 138 01/26/2021   K 4.1 01/26/2021   CL 101 01/26/2021   CO2 30 01/26/2021   Lab Results  Component Value Date  ALT 16 01/26/2021   AST 15 01/26/2021   ALKPHOS 48 01/26/2021   BILITOT 0.4 01/26/2021   Lab Results  Component Value Date   HGBA1C 5.7 01/26/2021   HGBA1C 5.7 (H) 07/26/2020   HGBA1C 5.8 (H) 06/08/2020   HGBA1C 7.1 (H) 02/25/2020   HGBA1C 6.9 (H) 11/05/2019   Lab Results  Component Value Date   INSULIN 8.8 06/08/2020   INSULIN 13.7 02/25/2020   Lab Results  Component Value Date   TSH 2.80 01/26/2021   Lab  Results  Component Value Date   CHOL 241 (H) 01/26/2021   HDL 58.40 01/26/2021   LDLCALC 169 (H) 01/26/2021   LDLDIRECT 144.2 07/20/2013   TRIG 69.0 01/26/2021   CHOLHDL 4 01/26/2021   Lab Results  Component Value Date   WBC 7.7 07/26/2020   HGB 14.0 07/26/2020   HCT 42.0 07/26/2020   MCV 96.8 07/26/2020   PLT 358 07/26/2020   Lab Results  Component Value Date   FERRITIN 32 03/26/2016    Attestation Statements:   Reviewed by clinician on day of visit: allergies, medications, problem list, medical history, surgical history, family history, social history, and previous encounter notes.  Coral Ceo, am acting as Location manager for [employer], [place].  I have reviewed the above documentation for accuracy and completeness, and I agree with the above. -  Georgianne Fick, FNP

## 2021-04-17 ENCOUNTER — Encounter (INDEPENDENT_AMBULATORY_CARE_PROVIDER_SITE_OTHER): Payer: Self-pay | Admitting: Family Medicine

## 2021-04-25 ENCOUNTER — Other Ambulatory Visit: Payer: Self-pay | Admitting: Internal Medicine

## 2021-04-25 DIAGNOSIS — E1159 Type 2 diabetes mellitus with other circulatory complications: Secondary | ICD-10-CM

## 2021-05-10 ENCOUNTER — Ambulatory Visit (INDEPENDENT_AMBULATORY_CARE_PROVIDER_SITE_OTHER): Payer: BC Managed Care – PPO | Admitting: Family Medicine

## 2021-06-05 ENCOUNTER — Ambulatory Visit (INDEPENDENT_AMBULATORY_CARE_PROVIDER_SITE_OTHER): Payer: BC Managed Care – PPO | Admitting: Family Medicine

## 2021-06-05 ENCOUNTER — Encounter (INDEPENDENT_AMBULATORY_CARE_PROVIDER_SITE_OTHER): Payer: Self-pay | Admitting: Family Medicine

## 2021-06-05 ENCOUNTER — Other Ambulatory Visit: Payer: Self-pay

## 2021-06-05 VITALS — BP 120/77 | HR 60 | Temp 97.8°F | Ht 63.0 in | Wt 245.0 lb

## 2021-06-05 DIAGNOSIS — E559 Vitamin D deficiency, unspecified: Secondary | ICD-10-CM

## 2021-06-05 DIAGNOSIS — E1169 Type 2 diabetes mellitus with other specified complication: Secondary | ICD-10-CM

## 2021-06-05 DIAGNOSIS — Z6841 Body Mass Index (BMI) 40.0 and over, adult: Secondary | ICD-10-CM

## 2021-06-05 DIAGNOSIS — J4520 Mild intermittent asthma, uncomplicated: Secondary | ICD-10-CM | POA: Diagnosis not present

## 2021-06-05 DIAGNOSIS — E785 Hyperlipidemia, unspecified: Secondary | ICD-10-CM

## 2021-06-05 DIAGNOSIS — E119 Type 2 diabetes mellitus without complications: Secondary | ICD-10-CM

## 2021-06-05 MED ORDER — ACCU-CHEK GUIDE VI STRP: ORAL_STRIP | 12 refills | Status: DC

## 2021-06-05 NOTE — Progress Notes (Signed)
The 10-year ASCVD risk score Mikey Bussing DC Brooke Bonito., et al., 2013) is: 6.8%   Values used to calculate the score:     Age: 60 years     Sex: Female     Is Non-Hispanic African American: No     Diabetic: Yes     Tobacco smoker: No     Systolic Blood Pressure: 517 mmHg     Is BP treated: Yes     HDL Cholesterol: 58.4 mg/dL     Total Cholesterol: 241 mg/dL

## 2021-06-05 NOTE — Addendum Note (Signed)
Addended by: Georgianne Fick on: 06/05/2021 10:11 AM   Modules accepted: Orders

## 2021-06-07 NOTE — Progress Notes (Signed)
Chief Complaint:   OBESITY Rachel Norris is here to discuss her progress with her obesity treatment plan along with follow-up of her obesity related diagnoses. Rachel Norris is on the Category 3 Plan and states she is following her eating plan approximately 97% of the time. Rachel Norris states she is going to the gym and walking 30 minutes 3 times per week.  Today's visit was #: 21 Starting weight: 319 lbs Starting date: 02/25/2020 Today's weight: 245 lbs Today's date: 06/05/2021 Total lbs lost to date: 74 lbs Total lbs lost since last in-office visit: 0  Interim History: IC repeated today. RMR is 2059, which is an increase of 271 vcal/day. Rachel Norris has not had an OV since April 13. She says she has added some chia seeds and fruits without adding the calories.   Subjective:   1. Mild intermittent asthma without complication Asthma is well controlled. Rachel Norris has albuterol for PRN use but never uses it. She has seasonal allergies.  2. Vitamin D deficiency Rachel Norris's Vit D is at goal (35). She is on prescription Vit D every 14 days.  3. Hyperlipidemia associated with type 2 diabetes mellitus ([place]) Rachel Norris's last LDL was elevated at 169. HDL and triglycerides were WNL. She is not on statin therapy. PCP wanted to re-assess labs in July 2022. ASCVD 10 year risk score is 6.8%. Medication(s) reviewed. Patient denies myalgias.   Lab Results  Component Value Date   CHOL 241 (H) 01/26/2021   HDL 58.40 01/26/2021   LDLCALC 169 (H) 01/26/2021   LDLDIRECT 144.2 07/20/2013   TRIG 69.0 01/26/2021   CHOLHDL 4 01/26/2021   Lab Results  Component Value Date   ALT 16 01/26/2021   AST 15 01/26/2021   ALKPHOS 48 01/26/2021   BILITOT 0.4 01/26/2021   The 10-year ASCVD risk score Rachel Norris DC Jr., et al., 2013) is: 6.8%   Values used to calculate the score:     Age: 59 years     Sex: Female     Is Non-Hispanic African American: No     Diabetic: Yes     Tobacco smoker: No     Systolic Blood Pressure: 703 mmHg     Is BP  treated: Yes     HDL Cholesterol: 58.4 mg/dL     Total Cholesterol: 241 mg/dL  4. Type 2 diabetes mellitus without complication, unspecified whether long term insulin use (HCC) Well controlled. Rachel Norris's last A1c was 5.7 (much improved from 7.1 before weight loss). She is on Metformin 1000 mg in the morning and 500 mg in the evening. CBG run in the low 100's in AM.   Lab Results  Component Value Date   HGBA1C 5.7 01/26/2021   HGBA1C 5.7 (H) 07/26/2020   HGBA1C 5.8 (H) 06/08/2020   Lab Results  Component Value Date   LDLCALC 169 (H) 01/26/2021   CREATININE 0.54 01/26/2021   Lab Results  Component Value Date   INSULIN 8.8 06/08/2020   INSULIN 13.7 02/25/2020    Assessment/Plan:   1. Mild intermittent asthma without complication Check IC today.  2. Vitamin D deficiency Low Vitamin D level contributes to fatigue and are associated with obesity, breast, and colon cancer. She agrees to continue to take prescription Vitamin D @50 ,000 IU every 14 days and will follow-up for routine testing of Vitamin D, at least 2-3 times per year to avoid over-replacement.  3. Hyperlipidemia associated with type 2 diabetes mellitus ([place]) Cardiovascular risk and specific lipid/LDL goals reviewed.  We discussed several lifestyle  modifications today and Rachel Norris will continue to work on diet, exercise and weight loss efforts. Orders and follow up as documented in patient record.  Pt will have FLP checked next month with PCP. Increase fiber.  Counseling Intensive lifestyle modifications are the first line treatment for this issue. . Dietary changes: Increase soluble fiber. Decrease simple carbohydrates. . Exercise changes: Moderate to vigorous-intensity aerobic activity 150 minutes per week if tolerated. . Lipid-lowering medications: see documented in medical record.  4. Type 2 diabetes mellitus without complication, unspecified whether long term insulin use (HCC) Good blood sugar control is important to  decrease the likelihood of diabetic complications such as nephropathy, neuropathy, limb loss, blindness, coronary artery disease, and death. Intensive lifestyle modification including diet, exercise and weight loss are the first line of treatment for diabetes. PCP will check labs next month. Continue Metformin. - glucose blood (ACCU-CHEK GUIDE) test strip; Use as instructed  Dispense: 100 strip; Refill: 12  5. Obesity : Current BMI 43.41  Syriah is currently in the action stage of change. As such, her goal is to continue with weight loss efforts. She has agreed to the Category 3 Plan and keeping a food journal and adhering to recommended goals of 1450-1550 calories and 90-100 g protein.   Measure all extra foods and count toward extra calories. May use Benefiber or Metamucil to help with appetite.  Exercise goals: For substantial health benefits, adults should do at least 150 minutes (2 hours and 30 minutes) a week of moderate-intensity, or 75 minutes (1 hour and 15 minutes) a week of vigorous-intensity aerobic physical activity, or an equivalent combination of moderate- and vigorous-intensity aerobic activity. Aerobic activity should be performed in episodes of at least 10 minutes, and preferably, it should be spread throughout the week.  Behavioral modification strategies: better snacking choices, planning for success and keeping a strict food journal.  Rachel Norris has agreed to follow-up with our clinic in 3-4 weeks. She was informed of the importance of frequent follow-up visits to maximize her success with intensive lifestyle modifications for her multiple health conditions.   Objective:   Blood pressure 120/77, pulse 60, temperature 97.8 F (36.6 C), height 5\' 3"  (1.6 m), weight 245 lb (111.1 kg), last menstrual period 09/15/2016, SpO2 95 %. Body mass index is 43.4 kg/m.  General: Cooperative, alert, well developed, in no acute distress. HEENT: Conjunctivae and lids  unremarkable. Cardiovascular: Regular rhythm.  Lungs: Normal work of breathing. Neurologic: No focal deficits.   Lab Results  Component Value Date   CREATININE 0.54 01/26/2021   BUN 14 01/26/2021   NA 138 01/26/2021   K 4.1 01/26/2021   CL 101 01/26/2021   CO2 30 01/26/2021   Lab Results  Component Value Date   ALT 16 01/26/2021   AST 15 01/26/2021   ALKPHOS 48 01/26/2021   BILITOT 0.4 01/26/2021   Lab Results  Component Value Date   HGBA1C 5.7 01/26/2021   HGBA1C 5.7 (H) 07/26/2020   HGBA1C 5.8 (H) 06/08/2020   HGBA1C 7.1 (H) 02/25/2020   HGBA1C 6.9 (H) 11/05/2019   Lab Results  Component Value Date   INSULIN 8.8 06/08/2020   INSULIN 13.7 02/25/2020   Lab Results  Component Value Date   TSH 2.80 01/26/2021   Lab Results  Component Value Date   CHOL 241 (H) 01/26/2021   HDL 58.40 01/26/2021   LDLCALC 169 (H) 01/26/2021   LDLDIRECT 144.2 07/20/2013   TRIG 69.0 01/26/2021   CHOLHDL 4 01/26/2021   Lab Results  Component Value Date   WBC 7.7 07/26/2020   HGB 14.0 07/26/2020   HCT 42.0 07/26/2020   MCV 96.8 07/26/2020   PLT 358 07/26/2020   Lab Results  Component Value Date   FERRITIN 32 03/26/2016      Attestation Statements:   Reviewed by clinician on day of visit: allergies, medications, problem list, medical history, surgical history, family history, social history, and previous encounter notes.  Coral Ceo, CMA, am acting as Location manager for [employer], [place].  I have reviewed the above documentation for accuracy and completeness, and I agree with the above. -

## 2021-06-11 NOTE — Progress Notes (Signed)
Chief Complaint:   OBESITY Rachel Norris is here to discuss her progress with her obesity treatment plan along with follow-up of her obesity related diagnoses. Rachel Norris is on the Category 3 Plan and states she is following her eating plan approximately 97% of the time. Rachel Norris states she is going to the gym and walking 30 minutes 3 times per week.  Today's visit was #: 21 Starting weight: 319 lbs Starting date: 02/25/2020 Today's weight: 245 lbs Today's date: 06/05/2021 Total lbs lost to date: 74 lbs Total lbs lost since last in-office visit: 0  Interim History: IC repeated today. RMR is 2059, which is an increase of 271 kcal/day. Rachel Norris has not had an OV since April 13.  She says she has added some chia seeds and fruits into her plan without accounting for the calories.   Subjective:   1. Mild intermittent asthma without complication Asthma is well controlled. Rachel Norris has albuterol for PRN use but never uses it. She has seasonal allergies.  2. Vitamin D deficiency Rachel Norris's Vit D is at goal (52). She is on prescription Vit D every 14 days.  3. Hyperlipidemia associated with type 2 diabetes mellitus (Rachel Norris) Rachel Norris's last LDL was elevated at 169. HDL and triglycerides were WNL. She is not on statin therapy. PCP wanted to re-assess labs in July 2022. ASCVD 10 year risk score is 6.8%.   Lab Results  Component Value Date   CHOL 241 (H) 01/26/2021   HDL 58.40 01/26/2021   LDLCALC 169 (H) 01/26/2021   LDLDIRECT 144.2 07/20/2013   TRIG 69.0 01/26/2021   CHOLHDL 4 01/26/2021   Lab Results  Component Value Date   ALT 16 01/26/2021   AST 15 01/26/2021   ALKPHOS 48 01/26/2021   BILITOT 0.4 01/26/2021   The 10-year ASCVD risk score Rachel Bussing DC Jr., et al., 2013) is: 6.8%   Values used to calculate the score:     Age: 60 years     Sex: Female     Is Non-Hispanic African American: No     Diabetic: Yes     Tobacco smoker: No     Systolic Blood Pressure: 326 mmHg     Is BP treated: Yes     HDL  Cholesterol: 58.4 mg/dL     Total Cholesterol: 241 mg/dL  4. Type 2 diabetes mellitus without complication, unspecified whether long term insulin use (HCC) Well controlled. Rachel Norris's last A1c was 5.7 (much improved from 7.1 before weight loss). She is on Metformin 1000 mg in the morning and 500 mg in the evening. CBG run in the low 100's in AM.   Lab Results  Component Value Date   HGBA1C 5.7 01/26/2021   HGBA1C 5.7 (H) 07/26/2020   HGBA1C 5.8 (H) 06/08/2020   Lab Results  Component Value Date   LDLCALC 169 (H) 01/26/2021   CREATININE 0.54 01/26/2021   Lab Results  Component Value Date   INSULIN 8.8 06/08/2020   INSULIN 13.7 02/25/2020    Assessment/Plan:   1. Mild intermittent asthma without complication Check IC today.  2. Vitamin D deficiency She agrees to continue to take prescription Vitamin D @50 ,000 IU every 14 days and will follow-up for routine testing of Vitamin D, at least 2-3 times per year to avoid over-replacement.  3. Hyperlipidemia associated with type 2 diabetes mellitus (Rachel Norris)  Pt will have FLP checked next month with PCP. Increase fiber.   4. Type 2 diabetes mellitus without complication, unspecified whether long term insulin use (Rachel Norris)  PCP will  check labs next month. Continue Metformin. - glucose blood (ACCU-CHEK GUIDE) test strip; Use as instructed  Dispense: 100 strip; Refill: 12  5. Obesity : Current BMI 43.41  Rachel Norris is currently in the action stage of change. As such, her goal is to continue with weight loss efforts. She has agreed to the Category 3 Plan and keeping a food journal and adhering to recommended goals of 1450-1550 calories and 90-100 g protein.   Measure all extra foods and count toward extra calories. May use Benefiber or Metamucil to help with appetite.  Exercise goals: For substantial health benefits, adults should do at least 150 minutes (2 hours and 30 minutes) a week of moderate-intensity, or 75 minutes (1 hour and 15 minutes) a  week of vigorous-intensity aerobic physical activity, or an equivalent combination of moderate- and vigorous-intensity aerobic activity. Aerobic activity should be performed in episodes of at least 10 minutes, and preferably, it should be spread throughout the week.  Behavioral modification strategies: better snacking choices, planning for success and keeping a strict food journal.  Rachel Norris has agreed to follow-up with our clinic in 3-4 weeks.  Objective:   Blood pressure 120/77, pulse 60, temperature 97.8 F (36.6 C), height 5\' 3"  (1.6 m), weight 245 lb (111.1 kg), last menstrual period 09/15/2016, SpO2 95 %. Body mass index is 43.4 kg/m.  General: Cooperative, alert, well developed, in no acute distress. HEENT: Conjunctivae and lids unremarkable. Cardiovascular: Regular rhythm.  Lungs: Normal work of breathing. Neurologic: No focal deficits.   Lab Results  Component Value Date   CREATININE 0.54 01/26/2021   BUN 14 01/26/2021   NA 138 01/26/2021   K 4.1 01/26/2021   CL 101 01/26/2021   CO2 30 01/26/2021   Lab Results  Component Value Date   ALT 16 01/26/2021   AST 15 01/26/2021   ALKPHOS 48 01/26/2021   BILITOT 0.4 01/26/2021   Lab Results  Component Value Date   HGBA1C 5.7 01/26/2021   HGBA1C 5.7 (H) 07/26/2020   HGBA1C 5.8 (H) 06/08/2020   HGBA1C 7.1 (H) 02/25/2020   HGBA1C 6.9 (H) 11/05/2019   Lab Results  Component Value Date   INSULIN 8.8 06/08/2020   INSULIN 13.7 02/25/2020   Lab Results  Component Value Date   TSH 2.80 01/26/2021   Lab Results  Component Value Date   CHOL 241 (H) 01/26/2021   HDL 58.40 01/26/2021   LDLCALC 169 (H) 01/26/2021   LDLDIRECT 144.2 07/20/2013   TRIG 69.0 01/26/2021   CHOLHDL 4 01/26/2021   Lab Results  Component Value Date   WBC 7.7 07/26/2020   HGB 14.0 07/26/2020   HCT 42.0 07/26/2020   MCV 96.8 07/26/2020   PLT 358 07/26/2020   Lab Results  Component Value Date   FERRITIN 32 03/26/2016      Attestation  Statements:   Reviewed by clinician on day of visit: allergies, medications, problem list, medical history, surgical history, family history, social history, and previous encounter notes.  Coral Ceo, CMA, am acting as Location manager for [employer], [place].  I have reviewed the above documentation for accuracy and completeness, and I agree with the above. -  Georgianne Fick, FNP

## 2021-06-28 ENCOUNTER — Ambulatory Visit (INDEPENDENT_AMBULATORY_CARE_PROVIDER_SITE_OTHER): Payer: BC Managed Care – PPO | Admitting: Family Medicine

## 2021-06-30 ENCOUNTER — Other Ambulatory Visit: Payer: Self-pay | Admitting: Internal Medicine

## 2021-07-27 NOTE — Patient Instructions (Addendum)
Blood work was ordered.    You had a tetanus today.     Medications changes include :   dec metformin to 500 mg once a day with a meal.  Start rybelsus 3 mg daily.   Your prescription(s) have been submitted to your pharmacy. Please take as directed and contact our office if you believe you are having problem(s) with the medication(s).    Please followup in 6 months    Health Maintenance, Female Adopting a healthy lifestyle and getting preventive care are important in promoting health and wellness. Ask your health care provider about: The right schedule for you to have regular tests and exams. Things you can do on your own to prevent diseases and keep yourself healthy. What should I know about diet, weight, and exercise? Eat a healthy diet  Eat a diet that includes plenty of vegetables, fruits, low-fat dairy products, and lean protein. Do not eat a lot of foods that are high in solid fats, added sugars, or sodium.  Maintain a healthy weight Body mass index (BMI) is used to identify weight problems. It estimates body fat based on height and weight. Your health care provider can help determineyour BMI and help you achieve or maintain a healthy weight. Get regular exercise Get regular exercise. This is one of the most important things you can do for your health. Most adults should: Exercise for at least 150 minutes each week. The exercise should increase your heart rate and make you sweat (moderate-intensity exercise). Do strengthening exercises at least twice a week. This is in addition to the moderate-intensity exercise. Spend less time sitting. Even light physical activity can be beneficial. Watch cholesterol and blood lipids Have your blood tested for lipids and cholesterol at 59 years of age, then havethis test every 5 years. Have your cholesterol levels checked more often if: Your lipid or cholesterol levels are high. You are older than 58 years of age. You are at high risk  for heart disease. What should I know about cancer screening? Depending on your health history and family history, you may need to have cancer screening at various ages. This may include screening for: Breast cancer. Cervical cancer. Colorectal cancer. Skin cancer. Lung cancer. What should I know about heart disease, diabetes, and high blood pressure? Blood pressure and heart disease High blood pressure causes heart disease and increases the risk of stroke. This is more likely to develop in people who have high blood pressure readings, are of African descent, or are overweight. Have your blood pressure checked: Every 3-5 years if you are 13-1 years of age. Every year if you are 28 years old or older. Diabetes Have regular diabetes screenings. This checks your fasting blood sugar level. Have the screening done: Once every three years after age 64 if you are at a normal weight and have a low risk for diabetes. More often and at a younger age if you are overweight or have a high risk for diabetes. What should I know about preventing infection? Hepatitis B If you have a higher risk for hepatitis B, you should be screened for this virus. Talk with your health care provider to find out if you are at risk forhepatitis B infection. Hepatitis C Testing is recommended for: Everyone born from [birth year] through 1964. Anyone with known risk factors for hepatitis C. Sexually transmitted infections (STIs) Get screened for STIs, including gonorrhea and chlamydia, if: You are sexually active and are younger than 59 years of age. You are  older than 59 years of age and your health care provider tells you that you are at risk for this type of infection. Your sexual activity has changed since you were last screened, and you are at increased risk for chlamydia or gonorrhea. Ask your health care provider if you are at risk. Ask your health care provider about whether you are at high risk for HIV. Your health care  provider may recommend a prescription medicine to help prevent HIV infection. If you choose to take medicine to prevent HIV, you should first get tested for HIV. You should then be tested every 3 months for as long as you are taking the medicine. Pregnancy If you are about to stop having your period (premenopausal) and you may become pregnant, seek counseling before you get pregnant. Take 400 to 800 micrograms (mcg) of folic acid every day if you become pregnant. Ask for birth control (contraception) if you want to prevent pregnancy. Osteoporosis and menopause Osteoporosis is a disease in which the bones lose minerals and strength with aging. This can result in bone fractures. If you are 44 years old or older, or if you are at risk for osteoporosis and fractures, ask your health care provider if you should: Be screened for bone loss. Take a calcium or vitamin D supplement to lower your risk of fractures. Be given hormone replacement therapy (HRT) to treat symptoms of menopause. Follow these instructions at home: Lifestyle Do not use any products that contain nicotine or tobacco, such as cigarettes, e-cigarettes, and chewing tobacco. If you need help quitting, ask your health care provider. Do not use street drugs. Do not share needles. Ask your health care provider for help if you need support or information about quitting drugs. Alcohol use Do not drink alcohol if: Your health care provider tells you not to drink. You are pregnant, may be pregnant, or are planning to become pregnant. If you drink alcohol: Limit how much you use to 0-1 drink a day. Limit intake if you are breastfeeding. Be aware of how much alcohol is in your drink. In the [place] one drink equals one 12 oz bottle of beer (355 mL), one 5 oz glass of wine (148 mL), or one 1 oz glass of hard liquor (44 mL). General instructions Schedule regular health, dental, and eye exams. Stay current with your vaccines. Tell your health  care provider if: You often feel depressed. You have ever been abused or do not feel safe at home. Summary Adopting a healthy lifestyle and getting preventive care are important in promoting health and wellness. Follow your health care provider's instructions about healthy diet, exercising, and getting tested or screened for diseases. Follow your health care provider's instructions on monitoring your cholesterol and blood pressure. This information is not intended to replace advice given to you by your health care provider. Make sure you discuss any questions you have with your healthcare provider. Document Revised: 12/10/2018 Document Reviewed: 12/10/2018 Elsevier Patient Education  2022 [employer].

## 2021-07-27 NOTE — Progress Notes (Signed)
Subjective:    Patient ID: Rachel Norris, female    DOB: 1961-07-09, 60 y.o.   MRN: [id number]   This visit occurred during the SARS-CoV-2 public health emergency.  Safety protocols were in place, including screening questions prior to the visit, additional usage of staff PPE, and extensive cleaning of exam room while observing appropriate contact time as indicated for disinfecting solutions.    HPI She is here for a physical exam.   Overall she is doing well.  She is continuing to work on her weight loss efforts, but feels she has plateaued.  She feels she is eating healthy.  Medications and allergies reviewed with patient and updated if appropriate.  Patient Active Problem List   Diagnosis Date Noted   Morbid obesity with BMI of 40.0-44.9, adult ([place]) 07/28/2021   Hot flashes 01/26/2021   SCC (squamous cell carcinoma) 07/26/2020   Knee osteoarthritis 07/26/2020   Hyperlipidemia associated with type 2 diabetes mellitus ([place]) 06/09/2020   Vitamin D deficiency 04/28/2020   Acute medial meniscus tear of left knee 11/09/2019   Acute lateral meniscus tear of left knee [id number]   Synovial plica of knee, left [id number]   Chondromalacia, left knee 11/09/2019   Seasonal and perennial allergic rhinitis 08/01/2018   Diabetes ([place]) 07/27/2013   Asthma 06/22/2008   Hypertension associated with type 2 diabetes mellitus ([place]) 03/07/2007    Current Outpatient Medications on File Prior to Visit  Medication Sig Dispense Refill   acetaminophen (TYLENOL) 650 MG CR tablet Take 1,300 mg by mouth every 8 (eight) hours as needed for pain.     albuterol (VENTOLIN HFA) 108 (90 Base) MCG/ACT inhaler Inhale 2 puffs into the lungs every 4 (four) hours as needed for wheezing or shortness of breath.      B Complex Vitamins (B COMPLEX 1 PO) Take by mouth.     BIOTIN PO Take by mouth.     Collagen-Vitamin C-Biotin (COLLAGEN 1500/C PO) Take by mouth.     diphenhydrAMINE HCl, Sleep, (UNISOM  SLEEPGELS) 50 MG CAPS Take 50 mg by mouth at bedtime as needed (sleep).     fexofenadine (ALLEGRA) 180 MG tablet Take 180 mg by mouth daily as needed for allergies or rhinitis.     fluticasone (FLONASE) 50 MCG/ACT nasal spray Place 2 sprays into both nostrils daily as needed for allergies. 16 g 5   glucose blood (ACCU-CHEK GUIDE) test strip Use as instructed 100 strip 12   hydrochlorothiazide (HYDRODIURIL) 25 MG tablet TAKE 1 TABLET BY MOUTH EVERY DAY 90 tablet 2   hydrocortisone 2.5 % ointment Apply topically.     Lancets (ACCU-CHEK SOFT TOUCH) lancets CHECK BLOOD SUGAR TWICE DAILY 180 each 1   losartan (COZAAR) 100 MG tablet TAKE 1 TABLET BY MOUTH EVERY DAY 90 tablet 2   Menaquinone-7 (VITAMIN K2 PO) Take by mouth.     Menthol, Topical Analgesic, (BIOFREEZE EX) Apply 1 application topically daily as needed (knee pain).     metFORMIN (GLUCOPHAGE) 500 MG tablet TAKE 2 TABLETS BY MOUTH MORNING AND 1 TABLET EACH EVENING 270 tablet 1   omeprazole (PRILOSEC) 40 MG capsule Take 1 capsule (40 mg total) by mouth daily as needed (For heartburn or acid reflux.). 90 capsule 3   Specialty Vitamins Products (VITAMINS FOR HAIR) CAPS Take by mouth.     Vitamin D, Ergocalciferol, (DRISDOL) 1.25 MG (50000 UNIT) CAPS capsule Take 1 capsule (50,000 Units total) by mouth every 14 (fourteen) days. (Patient taking differently: Take 50,000  Units by mouth every 14 (fourteen) days. Patient taking every other week) 6 capsule 0   No current facility-administered medications on file prior to visit.    Past Medical History:  Diagnosis Date   Allergic rhinitis    Ankle swelling    Anxiety    Anxiety and depression    Arthritis    generalized arthritis -knees, feet.back.    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 - no Inhalers used daily.   De Quervain's  tenosynovitis, right    Depression    Diabetes mellitus without complication ([place])    "pre diabetes"   Edema, lower extremity    Fibroid    GERD (gastroesophageal reflux disease)    Heart palpitations    Hormone disorder    Hypertension    Irregular menstrual bleeding    Joint pain    Labial cyst    bilateral icclusion   Left knee pain    Obesity    Palpitations    Perimenopausal    Plantar fasciitis, bilateral    PONV (postoperative nausea and vomiting)    Pre-diabetes    Prediabetes    Shortness of breath dyspnea    with exertion, climbing a flight of stairs   Tendonitis, Achilles, left    Thickened endometrium    Wears glasses     Past Surgical History:  Procedure Laterality Date   COLONOSCOPY WITH PROPOFOL N/A 05/15/2016   Procedure: COLONOSCOPY WITH PROPOFOL;  Surgeon: Mauri Pole, MD;  Location: [facility];  Service: Endoscopy;  Laterality: N/A;   DILATATION & CURETTAGE/HYSTEROSCOPY WITH MYOSURE N/A 08/30/2016   Procedure: DILATATION & CURETTAGE/HYSTEROSCOPY WITH MYOSURE;  Surgeon: Salvadore Dom, MD;  Location: [facility];  Service: Gynecology;  Laterality: N/A;  hysteroscopic myomectomy. BMI 53.8   DILATION AND CURETTAGE OF UTERUS     EAR CYST EXCISION Bilateral 04/08/2015   Procedure: removal of bilateral labial inclusion cysts;  Surgeon: Linda Hedges, DO;  Location: [facility];  Service: Gynecology;  Laterality: Bilateral;   HYSTEROSCOPY     HYSTEROSCOPY WITH D & C N/A 04/08/2015   Procedure: DILATATION AND CURETTAGE /HYSTEROSCOPY, removal of bilateral labial inclusion cysts;  Surgeon: Linda Hedges, DO;  Location: [facility];  Service: Gynecology;  Laterality: N/A;   KNEE ARTHROSCOPY WITH MEDIAL MENISECTOMY Left 11/09/2019   Procedure: ARTHROSCOPY KNEE WITH PARTIAL MEDIAL MENISECTOMY, PARTIAL LATERAL MENISECTOMY, MEDIAL CHONDROPLASTY, MEDIAL PLICA EXCISION AND PATELLA FEMORAL CHONDROPLASTY;  Surgeon: Dorna Leitz, MD;  Location:  [facility];  Service: Orthopedics;  Laterality: Left;   LASER ABLATION OF THE CERVIX  1992   DYSPLAGIA   MYOMECTOMY N/A 08/30/2016   Procedure: HYSTEROSCOPIC MYOMECTOMY;  Surgeon: Salvadore Dom, MD;  Location: [facility];  Service: Gynecology;  Laterality: N/A;   VIDEO ASSISTED THORACOSCOPY (VATS)/THOROCOTOMY Right 07-01-2008   dr gerhardt   w/ 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)    Social History   Socioeconomic History   Marital status: Married    Spouse name: Not on file   Number of children: 0   Years of education: Not on file   Highest education level: Not on file  Occupational History   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 [place]    Employer: [employer]  Tobacco Use   Smoking status: Former    Packs/day: 1.00    Years: 20.00    Pack years: 20.00    Types: Cigarettes    Quit date: 04/04/1996    Years since quitting: 25.3   Smokeless tobacco: Never  Vaping Use   Vaping Use: Never used  Substance and Sexual Activity   Alcohol use: Yes    Alcohol/week: 0.0 standard drinks    Comment: rarely   Drug use: No   Sexual activity: Not Currently    Partners: Male    Birth control/protection: Post-menopausal  Other Topics Concern   Not on file  Social History Narrative   Lives w/ husband    Retired 10-2019   Social Determinants of Radio broadcast assistant Strain: Not on file  Food Insecurity: Not on file  Transportation Needs: Not on file  Physical Activity: Not on file  Stress: Not on file  Social Connections: Not on file    Family History  Problem Relation Age of Onset   Diabetes Mother        M anmd others   Dementia Mother    Stroke Mother    Anxiety disorder Mother    Stroke Father    AAA (abdominal aortic aneurysm) Father    Hypertension Father    Hyperlipidemia Father    Heart disease Father    Atrial fibrillation Sister    Asthma Sister    Allergic rhinitis Sister    Bronchitis Sister    Angioedema  Sister    Allergic rhinitis Brother    Asthma Other    Allergic rhinitis Sister    Bronchitis Sister    Colon cancer Neg Hx    Breast cancer Neg Hx    Coronary artery disease Neg Hx    Eczema Neg Hx    Urticaria Neg Hx    Immunodeficiency Neg Hx     Review of Systems  Constitutional:  Negative for fever.       Temperature varies  HENT:  Positive for congestion (minimal occasionally).   Eyes:  Negative for visual disturbance.  Respiratory:  Negative for cough, shortness of breath and wheezing.   Cardiovascular:  Positive for leg swelling (minimal). Negative for chest pain and palpitations (too much caffeine).  Gastrointestinal:  Negative for abdominal pain, blood in stool, constipation, diarrhea (occ loose stools) and nausea.       Rare gerd  Genitourinary:  Negative for dysuria.  Musculoskeletal:  Positive for arthralgias (intermittent r knee) and back pain (occ sciatica).  Skin:  Positive for color change (has derm appt). Negative for rash.  Neurological:  Positive for headaches (rare headaches). Negative for light-headedness.  Psychiatric/Behavioral:  Negative for dysphoric mood. The patient is not nervous/anxious.       Objective:   Vitals:   07/28/21 0935  BP: 122/70  Pulse: 61  Temp: 98.4 F (36.9 C)  SpO2: 99%   Filed Weights   07/28/21 0935  Weight: 252 lb (114.3 kg)   Body mass index is 44.64 kg/m.  BP Readings from Last 3 Encounters:  07/28/21 122/70  06/05/21 120/77  04/12/21 117/73    Wt Readings from Last 3 Encounters:  07/28/21 252 lb (114.3 kg)  06/05/21 245 lb (111.1 kg)  04/12/21 243 lb (110.2 kg)     Physical Exam Constitutional: She appears well-developed and well-nourished. No distress.  HENT:  Head: Normocephalic and atraumatic.  Right Ear:  External ear normal. Normal ear canal and TM Left Ear: External ear normal.  Normal ear canal and TM Mouth/Throat: Oropharynx is clear and moist.  Eyes: Conjunctivae and EOM are normal.  Neck:  Neck supple. No tracheal deviation present. No thyromegaly present.  No carotid bruit  Cardiovascular: Normal rate, regular rhythm and normal heart sounds.   No murmur heard.  No edema. Pulmonary/Chest: Effort normal and breath sounds normal. No respiratory distress. She has no wheezes. She has no rales.  Breast: deferred   Abdominal: Soft. She exhibits no distension. There is no tenderness.  Lymphadenopathy: She has no cervical adenopathy.  Skin: Skin is warm and dry. She is not diaphoretic.  Psychiatric: She has a normal mood and affect. Her behavior is normal.   Diabetic Foot Exam - Simple   Simple Foot Form Diabetic Foot exam was performed with the following findings: Yes   Visual Inspection No deformities, no ulcerations, no other skin breakdown bilaterally: Yes See comments: Yes Sensation Testing Intact to touch and monofilament testing bilaterally: Yes Pulse Check Posterior Tibialis and Dorsalis pulse intact bilaterally: Yes Comments Mild bilateral callus formation first medial toes         Assessment & Plan:   Physical exam: Screening blood work  ordered Exercise  goes to gym, walking Diet  discussed diet- stressed calorie counting Weight  working on weight loss - will go back to weight loss clinic Substance abuse  none   Tdap today   Health Maintenance  Topic Date Due   TETANUS/TDAP  12/31/2020   COVID-19 Vaccine (4 - Booster for Pfizer series) 02/08/2021   FOOT EXAM  07/26/2021   HEMOGLOBIN A1C  07/26/2021   Pneumococcal Vaccine 64-16 Years old (3 - PPSV23 or PCV20) 12/28/2025 (Originally 04/24/2017)   INFLUENZA VACCINE  07/31/2021   OPHTHALMOLOGY EXAM  01/31/2022   MAMMOGRAM  02/27/2022   PAP SMEAR-Modifier  08/26/2023   COLONOSCOPY (Pts 45-56yr Insurance coverage will need to be confirmed)  05/15/2026   PNEUMOCOCCAL POLYSACCHARIDE VACCINE AGE 39-64 HIGH RISK  Completed   Hepatitis C Screening  Completed   HIV Screening  Completed   Zoster Vaccines-  Shingrix  Completed   HPV VACCINES  Aged Out          See Problem List for Assessment and Plan of chronic medical problems.

## 2021-07-28 ENCOUNTER — Encounter: Payer: Self-pay | Admitting: Internal Medicine

## 2021-07-28 ENCOUNTER — Other Ambulatory Visit: Payer: Self-pay

## 2021-07-28 ENCOUNTER — Ambulatory Visit (INDEPENDENT_AMBULATORY_CARE_PROVIDER_SITE_OTHER): Payer: BC Managed Care – PPO | Admitting: Internal Medicine

## 2021-07-28 VITALS — BP 122/70 | HR 61 | Temp 98.4°F | Ht 63.0 in | Wt 252.0 lb

## 2021-07-28 DIAGNOSIS — E1159 Type 2 diabetes mellitus with other circulatory complications: Secondary | ICD-10-CM | POA: Diagnosis not present

## 2021-07-28 DIAGNOSIS — Z0000 Encounter for general adult medical examination without abnormal findings: Secondary | ICD-10-CM

## 2021-07-28 DIAGNOSIS — E559 Vitamin D deficiency, unspecified: Secondary | ICD-10-CM

## 2021-07-28 DIAGNOSIS — Z6841 Body Mass Index (BMI) 40.0 and over, adult: Secondary | ICD-10-CM

## 2021-07-28 DIAGNOSIS — D1720 Benign lipomatous neoplasm of skin and subcutaneous tissue of unspecified limb: Secondary | ICD-10-CM | POA: Insufficient documentation

## 2021-07-28 DIAGNOSIS — I152 Hypertension secondary to endocrine disorders: Secondary | ICD-10-CM | POA: Diagnosis not present

## 2021-07-28 DIAGNOSIS — E785 Hyperlipidemia, unspecified: Secondary | ICD-10-CM

## 2021-07-28 DIAGNOSIS — D1722 Benign lipomatous neoplasm of skin and subcutaneous tissue of left arm: Secondary | ICD-10-CM

## 2021-07-28 DIAGNOSIS — E1169 Type 2 diabetes mellitus with other specified complication: Secondary | ICD-10-CM | POA: Diagnosis not present

## 2021-07-28 LAB — CBC WITH DIFFERENTIAL/PLATELET
Basophils Absolute: 0.1 10*3/uL (ref 0.0–0.1)
Basophils Relative: 1 % (ref 0.0–3.0)
Eosinophils Absolute: 0.2 10*3/uL (ref 0.0–0.7)
Eosinophils Relative: 2.5 % (ref 0.0–5.0)
HCT: 39.6 % (ref 36.0–46.0)
Hemoglobin: 13.4 g/dL (ref 12.0–15.0)
Lymphocytes Relative: 31.5 % (ref 12.0–46.0)
Lymphs Abs: 2 10*3/uL (ref 0.7–4.0)
MCHC: 33.9 g/dL (ref 30.0–36.0)
MCV: 97.6 fl (ref 78.0–100.0)
Monocytes Absolute: 0.6 10*3/uL (ref 0.1–1.0)
Monocytes Relative: 10 % (ref 3.0–12.0)
Neutro Abs: 3.4 10*3/uL (ref 1.4–7.7)
Neutrophils Relative %: 55 % (ref 43.0–77.0)
Platelets: 304 10*3/uL (ref 150.0–400.0)
RBC: 4.06 Mil/uL (ref 3.87–5.11)
RDW: 12.6 % (ref 11.5–15.5)
WBC: 6.2 10*3/uL (ref 4.0–10.5)

## 2021-07-28 LAB — COMPREHENSIVE METABOLIC PANEL
ALT: 15 U/L (ref 0–35)
AST: 13 U/L (ref 0–37)
Albumin: 4.2 g/dL (ref 3.5–5.2)
Alkaline Phosphatase: 47 U/L (ref 39–117)
BUN: 17 mg/dL (ref 6–23)
CO2: 29 mEq/L (ref 19–32)
Calcium: 9.5 mg/dL (ref 8.4–10.5)
Chloride: 102 mEq/L (ref 96–112)
Creatinine, Ser: 0.45 mg/dL (ref 0.40–1.20)
GFR: 105.92 mL/min (ref >60.00)
Glucose, Bld: 94 mg/dL (ref 70–99)
Potassium: 3.7 mEq/L (ref 3.5–5.1)
Sodium: 139 mEq/L (ref 135–145)
Total Bilirubin: 0.4 mg/dL (ref 0.2–1.2)
Total Protein: 6.9 g/dL (ref 6.0–8.3)

## 2021-07-28 LAB — LIPID PANEL
Cholesterol: 226 mg/dL — ABNORMAL HIGH (ref 0–200)
HDL: 62.1 mg/dL (ref >39.00)
LDL Cholesterol: 150 mg/dL — ABNORMAL HIGH (ref 0–99)
NonHDL: 164.01
Total CHOL/HDL Ratio: 4
Triglycerides: 69 mg/dL (ref 0.0–149.0)
VLDL: 13.8 mg/dL (ref 0.0–40.0)

## 2021-07-28 LAB — HEMOGLOBIN A1C: Hgb A1c MFr Bld: 5.7 % (ref 4.6–6.5)

## 2021-07-28 LAB — TSH: TSH: 2.55 u[IU]/mL (ref 0.35–5.50)

## 2021-07-28 MED ORDER — METFORMIN HCL 500 MG PO TABS: 500.0000 mg | ORAL_TABLET | Freq: Every day | ORAL | 1 refills | Status: DC

## 2021-07-28 MED ORDER — RYBELSUS 3 MG PO TABS: 3.0000 mg | ORAL_TABLET | Freq: Every day | ORAL | 0 refills | Status: DC

## 2021-07-28 NOTE — Assessment & Plan Note (Signed)
Chronic Has lost some weight  Working on diet Exercising Will try rybelsus for DM which will hopefully help with weight loss

## 2021-07-28 NOTE — Assessment & Plan Note (Signed)
New Reassured She will monitor to make sure there is no growth

## 2021-07-28 NOTE — Assessment & Plan Note (Signed)
Chronic Check lipid panel, cmp Not on medication and wants to avoid it Regular exercise and healthy diet encouraged

## 2021-07-28 NOTE — Assessment & Plan Note (Signed)
Chronic BP well controlled Continue hctz 25 mg qd, losartan 100 mg qd cmp

## 2021-07-28 NOTE — Assessment & Plan Note (Addendum)
Chronic Lab Results  Component Value Date   HGBA1C 5.7 01/26/2021   Well controlled, but will adjust medication to help with weight loss, which is what she is struggling with and will ultimately help her diabetes Decrease metformin to 500 mg daily  Start rybelsus 3 mg daily x 1 month, then increae to 7 mg daily

## 2021-07-28 NOTE — Assessment & Plan Note (Addendum)
Chronic Taking vitamin D daily Check vitamin D level at her next visit

## 2021-08-14 LAB — HM DIABETES EYE EXAM

## 2021-08-29 ENCOUNTER — Other Ambulatory Visit: Payer: Self-pay

## 2021-08-29 ENCOUNTER — Telehealth: Payer: Self-pay | Admitting: Internal Medicine

## 2021-08-29 MED ORDER — RYBELSUS 3 MG PO TABS: 3.0000 mg | ORAL_TABLET | Freq: Every day | ORAL | 2 refills | Status: DC

## 2021-08-29 NOTE — Telephone Encounter (Signed)
Sent in today 

## 2021-08-29 NOTE — Telephone Encounter (Signed)
1.Medication Requested:  Semaglutide (RYBELSUS) 3 MG TABS   2. Pharmacy (Name, Street, [place]): [id number] [place] Phone:  [phone]:  [phone]  3. On Med List: yes  4. Last Visit with PCP: 07.29.22  5. Next visit date with PCP: 02.01.23   Agent: Please be advised that RX refills may take up to 3 business days. We ask that you follow-up with your pharmacy.

## 2021-08-30 MED ORDER — RYBELSUS 7 MG PO TABS: 7.0000 mg | ORAL_TABLET | Freq: Every day | ORAL | 5 refills | Status: DC

## 2021-08-30 NOTE — Addendum Note (Signed)
Addended by: Binnie Rail on: 08/30/2021 03:41 PM   Modules accepted: Orders

## 2021-08-30 NOTE — Telephone Encounter (Signed)
   Patient calling to clarify instructions/ dosage of  Rybelsus

## 2021-09-06 ENCOUNTER — Ambulatory Visit: Payer: BC Managed Care – PPO | Admitting: Obstetrics and Gynecology

## 2021-09-07 ENCOUNTER — Encounter: Payer: Self-pay | Admitting: Internal Medicine

## 2021-09-07 NOTE — Progress Notes (Signed)
Outside notes received. Information abstracted. Notes sent to scan.  

## 2021-10-02 NOTE — Progress Notes (Signed)
60 y.o. G0P0000 Married White or Caucasian Not Hispanic or Latino female here for annual exam. No vaginal bleeding. Not sexually active, fine with it.   No bowel or bladder c/o.   Diabetes is very well controlled. Last HgbA1C was 5.7. Down 9 lbs since last year. Goes to the gym.     Patient's last menstrual period was 09/15/2016.          Sexually active: Yes.    The current method of family planning is post menopausal status.    Exercising: Yes.     Wights and walking  Smoker:  no  Health Maintenance: Pap: 08/25/20 WNL Hr HPV +,  03/16/16 WNL HR Hpv Neg  History of abnormal Pap:  yes 2021 HPV +, laser surgery of her cervix in 1991 MMG:  03/01/21 Bi-rads 1 neg  BMD:   none  Colonoscopy: 05/15/16 normal, f/u in 10 years.  TDaP:  got it this year with her primary Gardasil: na   reports that she quit smoking about 25 years ago. Her smoking use included cigarettes. She has a 20.00 pack-year smoking history. She has never used smokeless tobacco. She reports current alcohol use. She reports that she does not use drugs. Drinks ~4 drinks a month. 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 Husband also retired.   Past Medical History:  Diagnosis Date   Allergic rhinitis    Ankle swelling    Anxiety    Anxiety and depression    Arthritis    generalized arthritis -knees, feet.back.    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 - no Inhalers used daily.   De Quervain's tenosynovitis, right    Depression    Diabetes mellitus without complication ([place])    "pre diabetes"   Edema, lower extremity    Fibroid    GERD (gastroesophageal reflux disease)    Heart palpitations    Hormone disorder    Hypertension    Irregular menstrual bleeding    Joint pain    Labial cyst    bilateral icclusion   Left knee pain    Obesity    Palpitations     Perimenopausal    Plantar fasciitis, bilateral    PONV (postoperative nausea and vomiting)    Pre-diabetes    Prediabetes    Shortness of breath dyspnea    with exertion, climbing a flight of stairs   Tendonitis, Achilles, left    Thickened endometrium    Wears glasses     Past Surgical History:  Procedure Laterality Date   COLONOSCOPY WITH PROPOFOL N/A 05/15/2016   Procedure: COLONOSCOPY WITH PROPOFOL;  Surgeon: Mauri Pole, MD;  Location: [facility];  Service: Endoscopy;  Laterality: N/A;   DILATATION & CURETTAGE/HYSTEROSCOPY WITH MYOSURE N/A 08/30/2016   Procedure: DILATATION & CURETTAGE/HYSTEROSCOPY WITH MYOSURE;  Surgeon: Salvadore Dom, MD;  Location: [facility];  Service: Gynecology;  Laterality: N/A;  hysteroscopic myomectomy. BMI 53.8   DILATION AND CURETTAGE OF UTERUS     EAR CYST EXCISION Bilateral 04/08/2015   Procedure: removal of bilateral labial inclusion cysts;  Surgeon: Linda Hedges, DO;  Location: [facility];  Service: Gynecology;  Laterality: Bilateral;   HYSTEROSCOPY  HYSTEROSCOPY WITH D & C N/A 04/08/2015   Procedure: DILATATION AND CURETTAGE /HYSTEROSCOPY, removal of bilateral labial inclusion cysts;  Surgeon: Linda Hedges, DO;  Location: [facility];  Service: Gynecology;  Laterality: N/A;   KNEE ARTHROSCOPY WITH MEDIAL MENISECTOMY Left 11/09/2019   Procedure: ARTHROSCOPY KNEE WITH PARTIAL MEDIAL MENISECTOMY, PARTIAL LATERAL MENISECTOMY, MEDIAL CHONDROPLASTY, MEDIAL PLICA EXCISION AND PATELLA FEMORAL CHONDROPLASTY;  Surgeon: Dorna Leitz, MD;  Location: [facility];  Service: Orthopedics;  Laterality: Left;   LASER ABLATION OF THE CERVIX  1992   DYSPLAGIA   MYOMECTOMY N/A 08/30/2016   Procedure: HYSTEROSCOPIC MYOMECTOMY;  Surgeon: Salvadore Dom, MD;  Location: [facility];  Service: Gynecology;  Laterality: N/A;   SKIN CANCER EXCISION Left    upper chest   VIDEO ASSISTED THORACOSCOPY (VATS)/THOROCOTOMY Right 07-01-2008   dr  gerhardt   w/ 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     albuterol (VENTOLIN HFA) 108 (90 Base) MCG/ACT inhaler Inhale 2 puffs into the lungs every 4 (four) hours as needed for wheezing or shortness of breath.      B Complex Vitamins (B COMPLEX 1 PO) Take by mouth.     Collagen-Vitamin C-Biotin (COLLAGEN 1500/C PO) Take by mouth.     diphenhydrAMINE HCl, Sleep, (UNISOM SLEEPGELS) 50 MG CAPS Take 50 mg by mouth at bedtime as needed (sleep).     fexofenadine (ALLEGRA) 180 MG tablet Take 180 mg by mouth daily as needed for allergies or rhinitis.     fluticasone (FLONASE) 50 MCG/ACT nasal spray Place 2 sprays into both nostrils daily as needed for allergies. 16 g 5   glucose blood (ACCU-CHEK GUIDE) test strip Use as instructed 100 strip 12   hydrochlorothiazide (HYDRODIURIL) 25 MG tablet TAKE 1 TABLET BY MOUTH EVERY DAY 90 tablet 2   hydrocortisone 2.5 % ointment Apply topically.     Lancets (ACCU-CHEK SOFT TOUCH) lancets CHECK BLOOD SUGAR TWICE DAILY 180 each 1   losartan (COZAAR) 100 MG tablet TAKE 1 TABLET BY MOUTH EVERY DAY 90 tablet 2   Menaquinone-7 (VITAMIN K2 PO) Take by mouth.     Menthol, Topical Analgesic, (BIOFREEZE EX) Apply 1 application topically daily as needed (knee pain).     metFORMIN (GLUCOPHAGE) 500 MG tablet Take 1 tablet (500 mg total) by mouth daily with breakfast. 90 tablet 1   omeprazole (PRILOSEC) 40 MG capsule Take 1 capsule (40 mg total) by mouth daily as needed (For heartburn or acid reflux.). 90 capsule 3   Semaglutide (RYBELSUS) 7 MG TABS Take 7 mg by mouth daily. 30 tablet 5   Specialty Vitamins Products (VITAMINS FOR HAIR) CAPS Take by mouth.     BIOTIN PO Take by mouth. (Patient not taking: Reported on 10/03/2021)     No current facility-administered  medications for this visit.    Family History  Problem Relation Age of Onset   Diabetes Mother        M anmd others   Dementia Mother    Stroke Mother    Anxiety disorder Mother    Stroke Father    AAA (abdominal aortic aneurysm) Father    Hypertension Father    Hyperlipidemia Father    Heart disease Father    Atrial fibrillation Sister    Asthma Sister    Allergic  rhinitis Sister    Bronchitis Sister    Angioedema Sister    Allergic rhinitis Brother    Asthma Other    Allergic rhinitis Sister    Bronchitis Sister    Colon cancer Neg Hx    Breast cancer Neg Hx    Coronary artery disease Neg Hx    Eczema Neg Hx    Urticaria Neg Hx    Immunodeficiency Neg Hx     Review of Systems  All other systems reviewed and are negative.  Exam:   BP 126/82   Pulse 69   Ht 5\' 2"  (1.575 m)   Wt 257 lb (116.6 kg)   LMP 09/15/2016   SpO2 97%   BMI 47.01 kg/m   Weight change: @WEIGHTCHANGE @ Height:   Height: 5\' 2"  (157.5 cm)  Ht Readings from Last 3 Encounters:  10/03/21 5\' 2"  (1.575 m)  07/28/21 5\' 3"  (1.6 m)  06/05/21 5\' 3"  (1.6 m)    General appearance: alert, cooperative and appears stated age Head: Normocephalic, without obvious abnormality, atraumatic Neck: no adenopathy, supple, symmetrical, trachea midline and thyroid normal to inspection and palpation Lungs: clear to auscultation bilaterally Cardiovascular: regular rate and rhythm Breasts: normal appearance, no masses or tenderness Abdomen: soft, non-tender; non distended,  no masses,  no organomegaly Extremities: extremities normal, atraumatic, no cyanosis or edema Skin: Skin color, texture, turgor normal. No rashes or lesions Lymph nodes: Cervical, supraclavicular, and axillary nodes normal. No abnormal inguinal nodes palpated Neurologic: Grossly normal   Pelvic: External genitalia:  no lesions              Urethra:  normal appearing urethra with no masses, tenderness or lesions              Bartholins and  Skenes: normal                 Vagina: normal appearing vagina with normal color and discharge, no lesions              Cervix: no lesions               Bimanual Exam:  Uterus:   no masses or tenderness              Adnexa: no mass, fullness, tenderness               Rectovaginal: Confirms               Anus:  normal sphincter tone, no lesions  Gae Dry chaperoned for the exam.  1. Well woman exam Discussed breast self exam Discussed calcium and vit D intake Labs with primary Colonoscopy and mammogram UTD  2. BMI 45.0-49.9, adult (HCC) Down 9 lbs in the last year. Working on weight loss.   3. Screening for cervical cancer - Cytology - PAP  4. HPV (human papilloma virus) anogenital infection - Cytology - PAP

## 2021-10-03 ENCOUNTER — Ambulatory Visit (INDEPENDENT_AMBULATORY_CARE_PROVIDER_SITE_OTHER): Payer: BC Managed Care – PPO | Admitting: Obstetrics and Gynecology

## 2021-10-03 ENCOUNTER — Encounter: Payer: Self-pay | Admitting: Obstetrics and Gynecology

## 2021-10-03 ENCOUNTER — Other Ambulatory Visit: Payer: Self-pay

## 2021-10-03 ENCOUNTER — Other Ambulatory Visit (HOSPITAL_COMMUNITY)
Admission: RE | Admit: 2021-10-03 | Discharge: 2021-10-03 | Disposition: A | Discharge status: Home / Self Care | Payer: BC Managed Care – PPO | Source: Ambulatory Visit | Attending: Obstetrics and Gynecology | Admitting: Obstetrics and Gynecology

## 2021-10-03 VITALS — BP 126/82 | HR 69 | Ht 62.0 in | Wt 257.0 lb

## 2021-10-03 DIAGNOSIS — Z124 Encounter for screening for malignant neoplasm of cervix: Secondary | ICD-10-CM | POA: Insufficient documentation

## 2021-10-03 DIAGNOSIS — Z01419 Encounter for gynecological examination (general) (routine) without abnormal findings: Secondary | ICD-10-CM | POA: Diagnosis not present

## 2021-10-03 DIAGNOSIS — Z6841 Body Mass Index (BMI) 40.0 and over, adult: Secondary | ICD-10-CM | POA: Diagnosis not present

## 2021-10-03 DIAGNOSIS — A630 Anogenital (venereal) warts: Secondary | ICD-10-CM

## 2021-10-03 NOTE — Patient Instructions (Signed)

## 2021-10-05 LAB — CYTOLOGY - PAP
Comment: NEGATIVE
Diagnosis: NEGATIVE
Diagnosis: REACTIVE
High risk HPV: NEGATIVE

## 2021-10-11 IMAGING — MG MM DIGITAL SCREENING BILAT W/ TOMO AND CAD
8 series · 8 of 24 positions shown · non-contrast
Comparison: Previous exam(s).

CLINICAL DATA: Screening.

EXAM:
DIGITAL SCREENING BILATERAL MAMMOGRAM WITH TOMOSYNTHESIS AND CAD
TECHNIQUE: Bilateral screening digital craniocaudal and mediolateral oblique
mammograms were obtained. Bilateral screening digital breast
tomosynthesis was performed. The images were evaluated with
computer-aided detection.

[R MLO synth-2D]
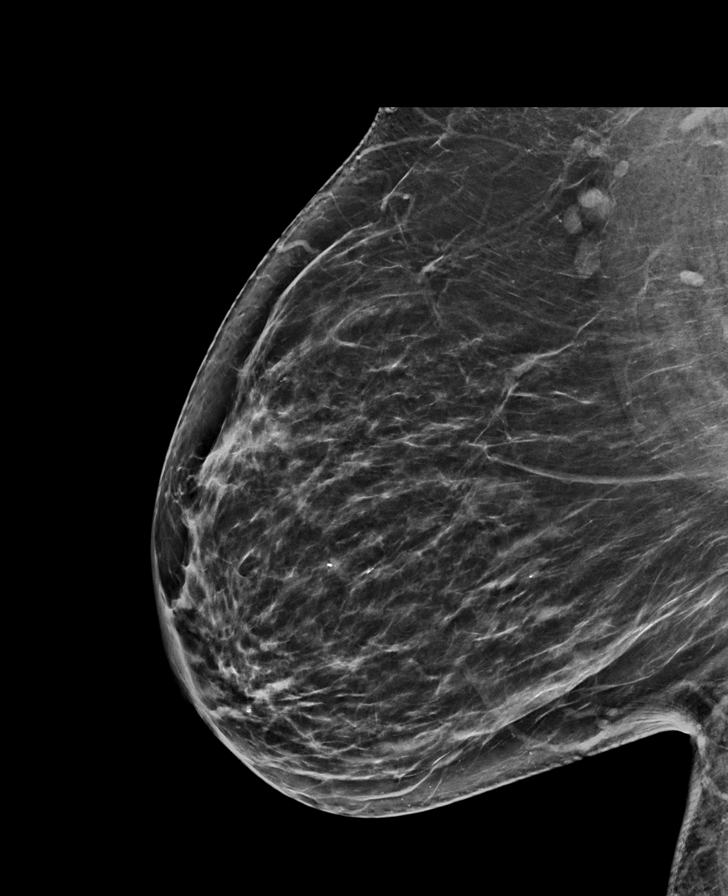

[L CC synth-2D]
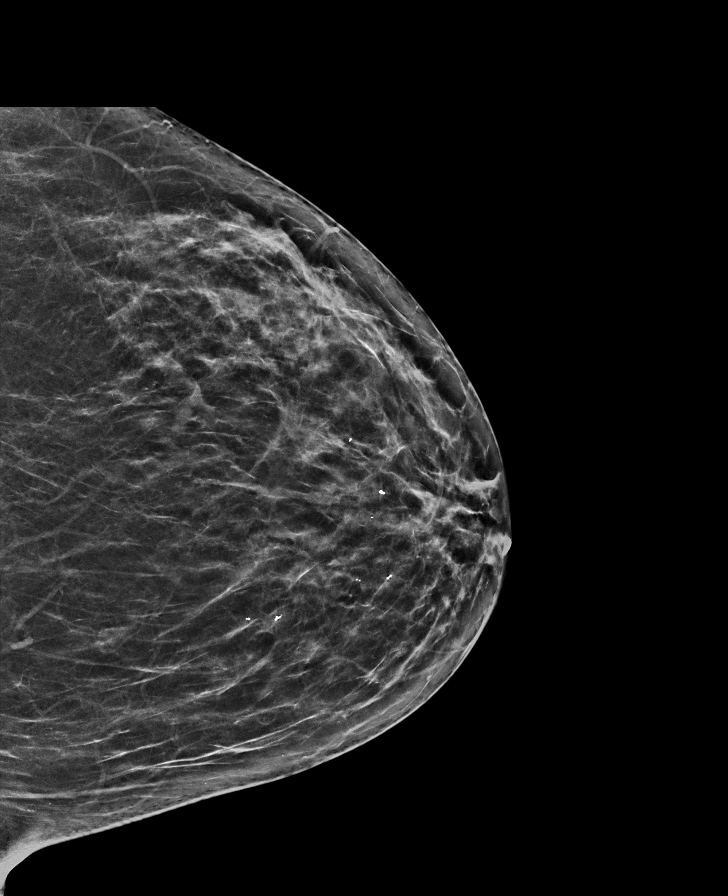

[L MLO synth-2D]
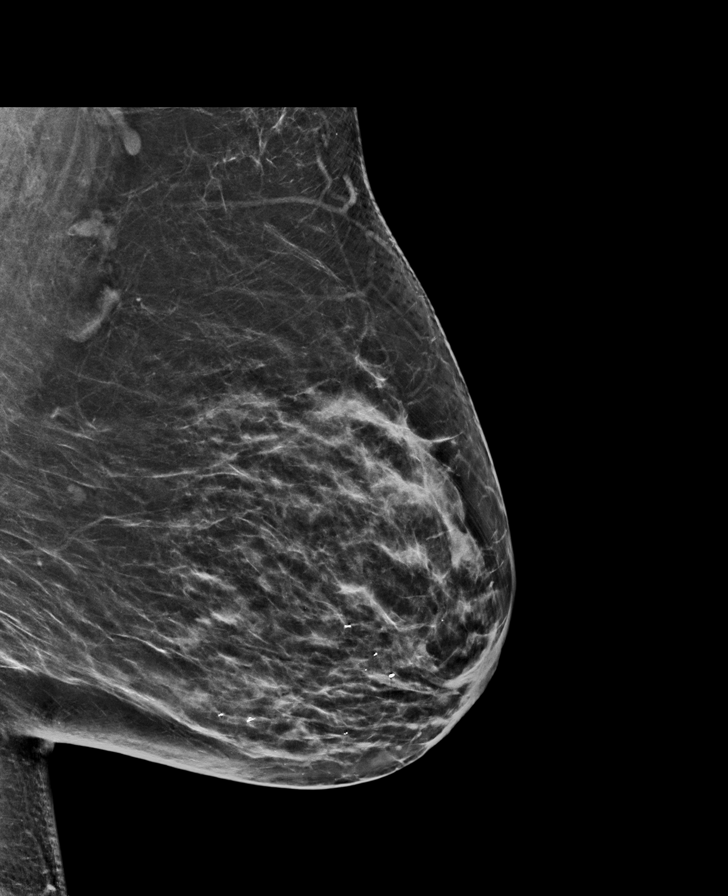

[R CC synth-2D]
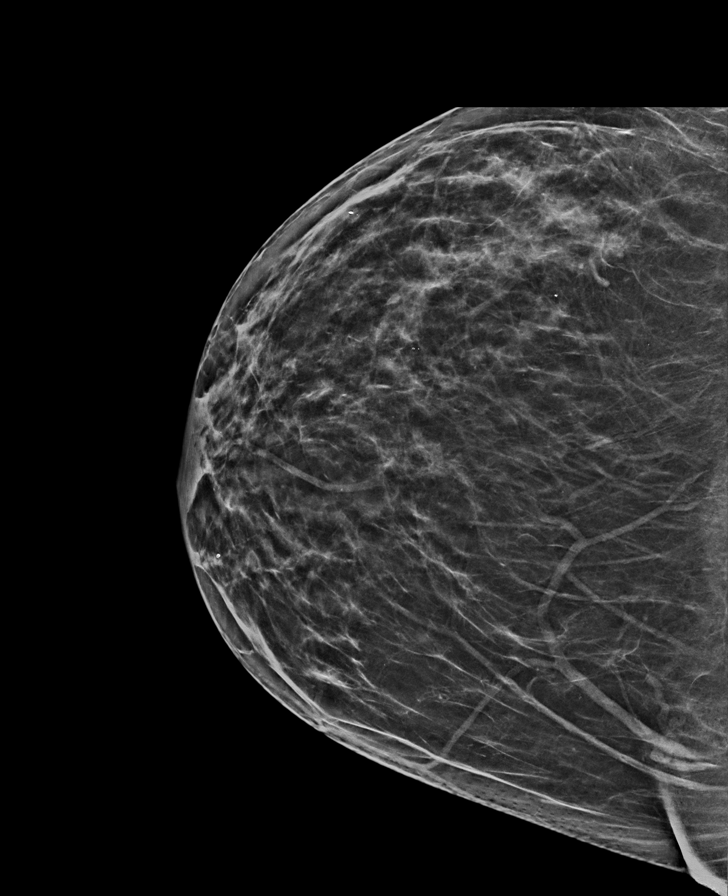

[L MLO tomo · tomo slice 39/76.0]
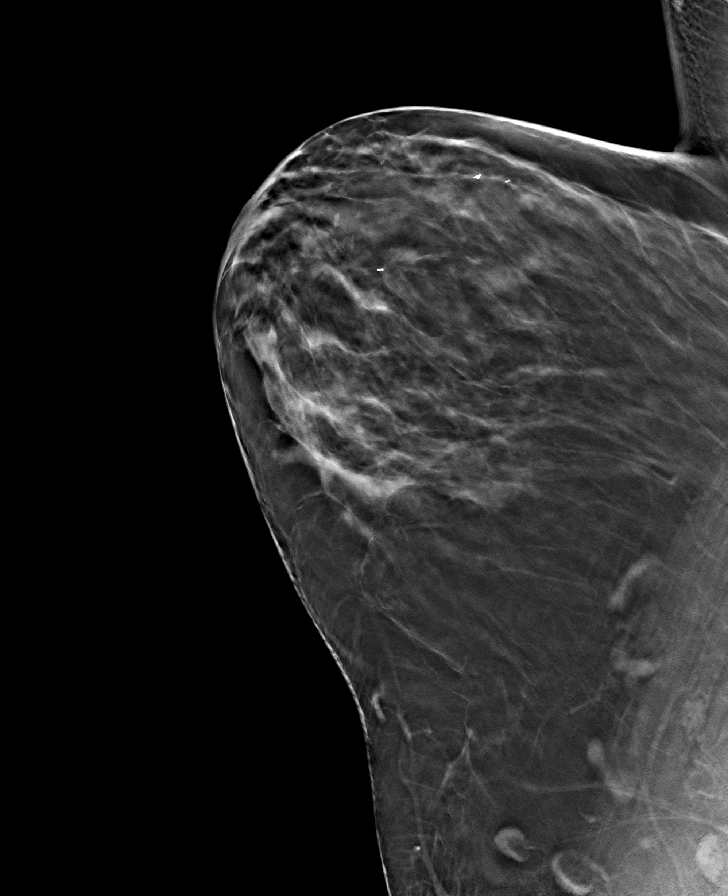

[R MLO tomo · tomo slice 39/77.0]
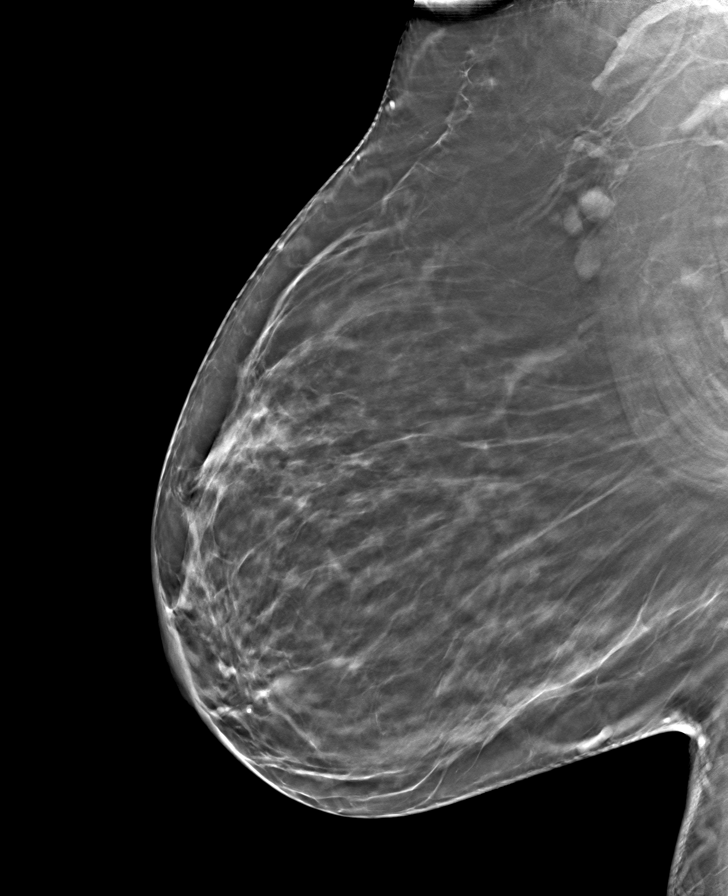

[L CC tomo · tomo slice 34/67.0]
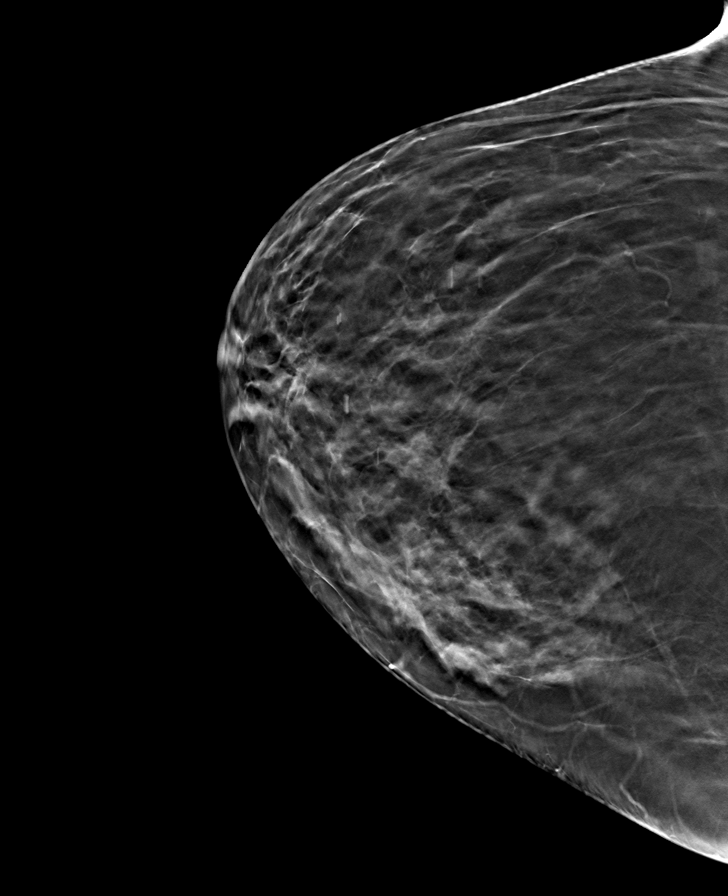

[R CC tomo · tomo slice 33/64.0]
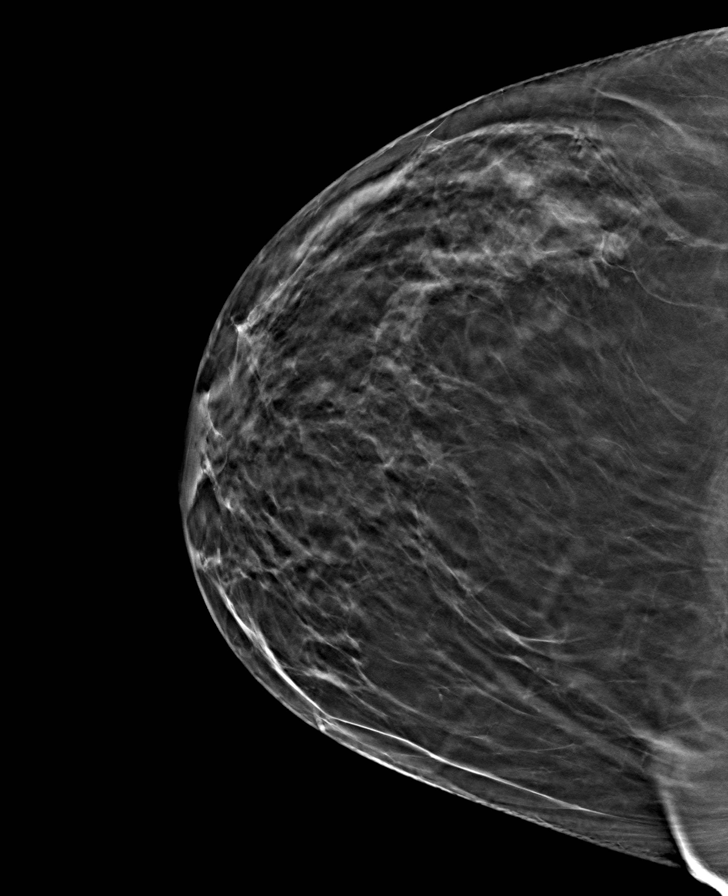

[8 of 24 positions shown; findings below may reference images not displayed]

ACR Breast Density Category b: There are scattered areas of
fibroglandular density.
FINDINGS: There are no findings suspicious for malignancy. The images were
evaluated with computer-aided detection.
IMPRESSION: No mammographic evidence of malignancy. A result letter of this
screening mammogram will be mailed directly to the patient.

RECOMMENDATION:
Screening mammogram in one year. (Code:[id number])

BI-RADS CATEGORY  1: Negative.

## 2021-12-01 ENCOUNTER — Telehealth: Payer: Self-pay | Admitting: Internal Medicine

## 2021-12-01 MED ORDER — RYBELSUS 14 MG PO TABS: 14.0000 mg | ORAL_TABLET | Freq: Every day | ORAL | 5 refills | Status: DC

## 2021-12-01 NOTE — Telephone Encounter (Signed)
Lets try to 14 mg daily to see if that helps more with weight loss.  At this point she can take any dose she wants if she feels better on the 7 mg we can go back to that after this month.  I sent a prescription for the 14 mg pills to her pharmacy.

## 2021-12-01 NOTE — Telephone Encounter (Signed)
Spoke with patient today.  Info given. Gave patient instructions also on how to download coupon for savings.

## 2021-12-01 NOTE — Telephone Encounter (Signed)
Patient calling in   Says she has completed her 30 days of the Semaglutide (RYBELSUS) 7 MG TABS  Wants to know if she needs to continue taking the 7MG  or does she need to move up to the 14MG   Please call patient [phone]  Pharmacy  [id number] - [place], [place] - [place]  Phone:  [phone] Fax:  [phone]

## 2021-12-29 ENCOUNTER — Other Ambulatory Visit: Payer: Self-pay | Admitting: Internal Medicine

## 2021-12-29 DIAGNOSIS — E1169 Type 2 diabetes mellitus with other specified complication: Secondary | ICD-10-CM

## 2022-01-30 ENCOUNTER — Encounter: Payer: Self-pay | Admitting: Internal Medicine

## 2022-01-30 DIAGNOSIS — K219 Gastro-esophageal reflux disease without esophagitis: Secondary | ICD-10-CM | POA: Insufficient documentation

## 2022-01-30 NOTE — Progress Notes (Signed)
Subjective:    Patient ID: Rachel Norris, female    DOB: 1961-01-31, 61 y.o.   MRN: [id number]  This visit occurred during the SARS-CoV-2 public health emergency.  Safety protocols were in place, including screening questions prior to the visit, additional usage of staff PPE, and extensive cleaning of exam room while observing appropriate contact time as indicated for disinfecting solutions.     HPI The patient is here for follow up of their chronic medical problems, including DM, htn, hld, GERD  Having palpitations intermittently.  Maybe more with coffee.  No chest pain.  She typically has 2 cups of coffee in the morning.  She does have a family history of heart issues and palpitations are concerning to her.  Medications and allergies reviewed with patient and updated if appropriate.  Patient Active Problem List   Diagnosis Date Noted   GERD (gastroesophageal reflux disease) 01/30/2022   Morbid obesity with BMI of 40.0-44.9, adult ([place]) 07/28/2021   Lipoma of arm 07/28/2021   Trigger little finger of right hand 03/01/2021   Hot flashes 01/26/2021   SCC (squamous cell carcinoma) 07/26/2020   Knee osteoarthritis 07/26/2020   Hyperlipidemia associated with type 2 diabetes mellitus ([place]) 06/09/2020   Vitamin D deficiency 04/28/2020   Acute medial meniscus tear of left knee 11/09/2019   Acute lateral meniscus tear of left knee 50/08/3817   Synovial plica of knee, left 29/93/7169   Chondromalacia, left knee 11/09/2019   Seasonal and perennial allergic rhinitis 08/01/2018   Diabetes ([place]) 07/27/2013   Asthma 06/22/2008   Hypertension associated with type 2 diabetes mellitus ([place]) 03/07/2007    Current Outpatient Medications on File Prior to Visit  Medication Sig Dispense Refill   acetaminophen (TYLENOL) 650 MG CR tablet Take 1,300 mg by mouth every 8 (eight) hours as needed for pain.     albuterol (VENTOLIN HFA) 108 (90 Base) MCG/ACT inhaler Inhale 2 puffs into the  lungs every 4 (four) hours as needed for wheezing or shortness of breath.      B Complex Vitamins (B COMPLEX 1 PO) Take by mouth.     Collagen-Vitamin C-Biotin (COLLAGEN 1500/C PO) Take by mouth.     diphenhydrAMINE HCl, Sleep, (UNISOM SLEEPGELS) 50 MG CAPS Take 50 mg by mouth at bedtime as needed (sleep).     fexofenadine (ALLEGRA) 180 MG tablet Take 180 mg by mouth daily as needed for allergies or rhinitis.     fluticasone (FLONASE) 50 MCG/ACT nasal spray Place 2 sprays into both nostrils daily as needed for allergies. 16 g 5   Glucosamine-Chondroit-Vit C-Mn (GLUCOSAMINE CHONDR 500 COMPLEX) CAPS See admin instructions.     glucose blood (ACCU-CHEK GUIDE) test strip Use as instructed 100 strip 12   hydrochlorothiazide (HYDRODIURIL) 25 MG tablet TAKE 1 TABLET BY MOUTH EVERY DAY 90 tablet 2   hydrocortisone 2.5 % ointment Apply topically.     Lancets (ACCU-CHEK SOFT TOUCH) lancets CHECK BLOOD SUGAR TWICE DAILY 180 each 1   losartan (COZAAR) 100 MG tablet TAKE 1 TABLET BY MOUTH EVERY DAY 90 tablet 2   Menaquinone-7 (VITAMIN K2 PO) Take by mouth.     Menthol, Topical Analgesic, (BIOFREEZE EX) Apply 1 application topically daily as needed (knee pain).     metFORMIN (GLUCOPHAGE) 500 MG tablet TAKE 2 TABLETS BY MOUTH MORNING AND 1 TABLET EACH EVENING 270 tablet 1   omeprazole (PRILOSEC) 40 MG capsule Take 1 capsule (40 mg total) by mouth daily as needed (For heartburn  or acid reflux.). 90 capsule 3   Semaglutide (RYBELSUS) 14 MG TABS Take 14 mg by mouth daily. 30 tablet 5   Specialty Vitamins Products (VITAMINS FOR HAIR) CAPS Take by mouth.     Semaglutide (RYBELSUS) 3 MG TABS See admin instructions. (Patient not taking: Reported on 01/31/2022)     No current facility-administered medications on file prior to visit.    Past Medical History:  Diagnosis Date   Allergic rhinitis    Ankle swelling    Anxiety    Anxiety and depression    Arthritis    generalized arthritis -knees, feet.back.     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 - no Inhalers used daily.   De Quervain's tenosynovitis, right    Depression    Diabetes mellitus without complication ([place])    "pre diabetes"   Edema, lower extremity    Fibroid    GERD (gastroesophageal reflux disease)    Heart palpitations    Hormone disorder    Hypertension    Irregular menstrual bleeding    Joint pain    Labial cyst    bilateral icclusion   Left knee pain    Obesity    Palpitations    Perimenopausal    Plantar fasciitis, bilateral    PONV (postoperative nausea and vomiting)    Pre-diabetes    Prediabetes    Shortness of breath dyspnea    with exertion, climbing a flight of stairs   Tendonitis, Achilles, left    Thickened endometrium    Wears glasses     Past Surgical History:  Procedure Laterality Date   COLONOSCOPY WITH PROPOFOL N/A 05/15/2016   Procedure: COLONOSCOPY WITH PROPOFOL;  Surgeon: Mauri Pole, MD;  [facility]: [facility];  Service: Endoscopy;  Laterality: N/A;   DILATATION & CURETTAGE/HYSTEROSCOPY WITH MYOSURE N/A 08/30/2016   Procedure: DILATATION & CURETTAGE/HYSTEROSCOPY WITH MYOSURE;  Surgeon: Salvadore Dom, MD;  Location: [facility];  Service: Gynecology;  Laterality: N/A;  hysteroscopic myomectomy. BMI 53.8   DILATION AND CURETTAGE OF UTERUS     EAR CYST EXCISION Bilateral 04/08/2015   Procedure: removal of bilateral labial inclusion cysts;  Surgeon: Linda Hedges, DO;  Location: [facility];  Service: Gynecology;  Laterality: Bilateral;   HYSTEROSCOPY     HYSTEROSCOPY WITH D & C N/A 04/08/2015   Procedure: DILATATION AND CURETTAGE /HYSTEROSCOPY, removal of bilateral labial inclusion cysts;  Surgeon: Linda Hedges, DO;  Location: [facility];  Service: Gynecology;  Laterality: N/A;   KNEE ARTHROSCOPY WITH  MEDIAL MENISECTOMY Left 11/09/2019   Procedure: ARTHROSCOPY KNEE WITH PARTIAL MEDIAL MENISECTOMY, PARTIAL LATERAL MENISECTOMY, MEDIAL CHONDROPLASTY, MEDIAL PLICA EXCISION AND PATELLA FEMORAL CHONDROPLASTY;  Surgeon: Dorna Leitz, MD;  Location: [facility];  Service: Orthopedics;  Laterality: Left;   LASER ABLATION OF THE CERVIX  1992   DYSPLAGIA   LASER ABLATION OF THE CERVIX     1991   MYOMECTOMY N/A 08/30/2016   Procedure: HYSTEROSCOPIC MYOMECTOMY;  Surgeon: Salvadore Dom, MD;  Location: [facility];  Service: Gynecology;  Laterality: N/A;   SKIN CANCER EXCISION Left    upper chest   VIDEO ASSISTED THORACOSCOPY (VATS)/THOROCOTOMY Right 07-01-2008   dr gerhardt   w/ Chales Salmon  resection 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)    Social History   Socioeconomic History   Marital status: Married    Spouse name: Not on file   Number of children: 0   Years of education: Not on file   Highest education level: Not on file  Occupational History   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[place]: [place]  Tobacco Use   Smoking status: Former    Packs/day: 1.00    Years: 20.00    Pack years: 20.00    Types: Cigarettes    Quit date: 04/04/1996    Years since quitting: 25.8   Smokeless tobacco: Never  Vaping Use   Vaping Use: Never used  Substance and Sexual Activity   Alcohol use: Yes    Alcohol/week: 0.0 standard drinks    Comment: rarely   Drug use: No   Sexual activity: Not Currently    Partners: Male    Birth control/protection: Post-menopausal  Other Topics Concern   Not on file  Social History Narrative   Lives w/ husband    Retired 10-2019   Social Determinants of Radio broadcast assistant Strain: Not on file  Food Insecurity: Not on file  Transportation Needs: Not on file  Physical Activity: Not on file  Stress: Not on file  Social Connections: Not on file    Family History  Problem Relation Age of Onset   Diabetes Mother         M anmd others   Dementia Mother    Stroke Mother    Anxiety disorder Mother    Stroke Father    AAA (abdominal aortic aneurysm) Father    Hypertension Father    Hyperlipidemia Father    Heart disease Father    Atrial fibrillation Sister    Asthma Sister    Allergic rhinitis Sister    Bronchitis Sister    Angioedema Sister    Allergic rhinitis Brother    Asthma Other    Allergic rhinitis Sister    Bronchitis Sister    Colon cancer Neg Hx    Breast cancer Neg Hx    Coronary artery disease Neg Hx    Eczema Neg Hx    Urticaria Neg Hx    Immunodeficiency Neg Hx     Review of Systems  Constitutional:  Negative for chills and fever.  Respiratory:  Negative for cough, shortness of breath and wheezing.   Cardiovascular:  Positive for palpitations (occ). Negative for chest pain and leg swelling (occ with salt intake).  Neurological:  Negative for light-headedness and headaches.      Objective:   Vitals:   01/31/22 1001  BP: 130/76  Pulse: 85  Temp: 98.1 F (36.7 C)  SpO2: 99%   BP Readings from Last 3 Encounters:  01/31/22 130/76  10/03/21 126/82  07/28/21 122/70   Wt Readings from Last 3 Encounters:  01/31/22 263 lb (119.3 kg)  10/03/21 257 lb (116.6 kg)  07/28/21 252 lb (114.3 kg)   Body mass index is 48.1 kg/m.   Physical Exam    Constitutional: Appears well-developed and well-nourished. No distress.  HENT:  Head: Normocephalic and atraumatic.  Neck: Neck supple. No tracheal deviation present. No thyromegaly present.  No cervical lymphadenopathy Cardiovascular: Normal rate, regular rhythm and normal heart sounds.   No murmur heard. No carotid bruit .  No edema Pulmonary/Chest: Effort normal and breath sounds normal. No respiratory distress. No has no wheezes.  No rales.  Skin: Skin is warm and dry. Not diaphoretic.  Psychiatric: Normal mood and affect. Behavior is normal.      Assessment & Plan:    See Problem List for Assessment and Plan of chronic  medical problems.

## 2022-01-30 NOTE — Patient Instructions (Addendum)
° ° °  Blood work was ordered.     Medications changes include :   ryblesus 7 mg daily,  stop metformin  Your prescription(s) have been submitted to your pharmacy. Please take as directed and contact our office if you believe you are having problem(s) with the medication(s).   A holter monitor was ordered.  Someone will contact you to arrange this.    Please followup in 6 months

## 2022-01-31 ENCOUNTER — Other Ambulatory Visit: Payer: Self-pay

## 2022-01-31 ENCOUNTER — Ambulatory Visit: Payer: BC Managed Care – PPO | Admitting: Internal Medicine

## 2022-01-31 ENCOUNTER — Ambulatory Visit (INDEPENDENT_AMBULATORY_CARE_PROVIDER_SITE_OTHER): Payer: BC Managed Care – PPO

## 2022-01-31 VITALS — BP 130/76 | HR 85 | Temp 98.1°F | Ht 62.0 in | Wt 263.0 lb

## 2022-01-31 DIAGNOSIS — R002 Palpitations: Secondary | ICD-10-CM

## 2022-01-31 DIAGNOSIS — E1169 Type 2 diabetes mellitus with other specified complication: Secondary | ICD-10-CM

## 2022-01-31 DIAGNOSIS — K219 Gastro-esophageal reflux disease without esophagitis: Secondary | ICD-10-CM

## 2022-01-31 DIAGNOSIS — E1159 Type 2 diabetes mellitus with other circulatory complications: Secondary | ICD-10-CM

## 2022-01-31 DIAGNOSIS — I152 Hypertension secondary to endocrine disorders: Secondary | ICD-10-CM | POA: Diagnosis not present

## 2022-01-31 DIAGNOSIS — E785 Hyperlipidemia, unspecified: Secondary | ICD-10-CM | POA: Diagnosis not present

## 2022-01-31 DIAGNOSIS — E559 Vitamin D deficiency, unspecified: Secondary | ICD-10-CM

## 2022-01-31 LAB — LIPID PANEL
Cholesterol: 235 mg/dL — ABNORMAL HIGH (ref 0–200)
HDL: 57.5 mg/dL (ref >39.00)
LDL Cholesterol: 162 mg/dL — ABNORMAL HIGH (ref 0–99)
NonHDL: 177.12
Total CHOL/HDL Ratio: 4
Triglycerides: 78 mg/dL (ref 0.0–149.0)
VLDL: 15.6 mg/dL (ref 0.0–40.0)

## 2022-01-31 LAB — COMPREHENSIVE METABOLIC PANEL
ALT: 14 U/L (ref 0–35)
AST: 15 U/L (ref 0–37)
Albumin: 4.3 g/dL (ref 3.5–5.2)
Alkaline Phosphatase: 53 U/L (ref 39–117)
BUN: 19 mg/dL (ref 6–23)
CO2: 31 mEq/L (ref 19–32)
Calcium: 9.7 mg/dL (ref 8.4–10.5)
Chloride: 101 mEq/L (ref 96–112)
Creatinine, Ser: 0.51 mg/dL (ref 0.40–1.20)
GFR: 102.41 mL/min (ref >60.00)
Glucose, Bld: 97 mg/dL (ref 70–99)
Potassium: 3.9 mEq/L (ref 3.5–5.1)
Sodium: 138 mEq/L (ref 135–145)
Total Bilirubin: 0.5 mg/dL (ref 0.2–1.2)
Total Protein: 7.1 g/dL (ref 6.0–8.3)

## 2022-01-31 LAB — HEMOGLOBIN A1C: Hgb A1c MFr Bld: 5.6 % (ref 4.6–6.5)

## 2022-01-31 LAB — TSH: TSH: 2.65 u[IU]/mL (ref 0.35–5.50)

## 2022-01-31 LAB — VITAMIN D 25 HYDROXY (VIT D DEFICIENCY, FRACTURES): VITD: 46.02 ng/mL (ref 30.00–100.00)

## 2022-01-31 MED ORDER — RYBELSUS 7 MG PO TABS: 7.0000 mg | ORAL_TABLET | Freq: Every day | ORAL | 3 refills | Status: DC

## 2022-01-31 MED ORDER — OMEPRAZOLE 40 MG PO CPDR: 40.0000 mg | DELAYED_RELEASE_CAPSULE | Freq: Every day | ORAL | 3 refills | Status: DC | PRN

## 2022-01-31 MED ORDER — ACCU-CHEK GUIDE VI STRP: ORAL_STRIP | 12 refills | Status: DC

## 2022-01-31 NOTE — Assessment & Plan Note (Addendum)
Chronic sugars well controlled Will decrease rybelsus to 7 mg  ( 14 mg caused more fatigue) Will try stopping metformin - not sure if it is helping a1c-May need to restart depending on sugars Continue healthy diet and regular exercise

## 2022-01-31 NOTE — Assessment & Plan Note (Signed)
Chronic BP well controlled Continue hctz 25 mg, losartan 100 mg cmp

## 2022-01-31 NOTE — Assessment & Plan Note (Signed)
Chronic GERD controlled Continue omeprazole 40 mg daily prn

## 2022-01-31 NOTE — Assessment & Plan Note (Signed)
Chronic Taking vitamin D daily Check vitamin D level  

## 2022-01-31 NOTE — Assessment & Plan Note (Signed)
Chronic Check lipid panel  Diet controlled - would like to avoid medication Regular exercise and healthy diet encouraged

## 2022-01-31 NOTE — Progress Notes (Unsigned)
Enrolled for [employer] to mail a ZIO XT long term holter monitor to the patients address on file.  

## 2022-02-01 ENCOUNTER — Encounter: Payer: Self-pay | Admitting: Internal Medicine

## 2022-02-03 DIAGNOSIS — R002 Palpitations: Secondary | ICD-10-CM | POA: Diagnosis not present

## 2022-02-21 ENCOUNTER — Other Ambulatory Visit (HOSPITAL_BASED_OUTPATIENT_CLINIC_OR_DEPARTMENT_OTHER): Payer: Self-pay | Admitting: Internal Medicine

## 2022-02-21 DIAGNOSIS — Z1231 Encounter for screening mammogram for malignant neoplasm of breast: Secondary | ICD-10-CM

## 2022-02-28 ENCOUNTER — Ambulatory Visit (HOSPITAL_BASED_OUTPATIENT_CLINIC_OR_DEPARTMENT_OTHER)
Admission: RE | Admit: 2022-02-28 | Discharge: 2022-02-28 | Disposition: A | Discharge status: Home / Self Care | Payer: BC Managed Care – PPO | Source: Ambulatory Visit | Attending: Internal Medicine | Admitting: Internal Medicine

## 2022-02-28 ENCOUNTER — Other Ambulatory Visit: Payer: Self-pay

## 2022-02-28 ENCOUNTER — Encounter (HOSPITAL_BASED_OUTPATIENT_CLINIC_OR_DEPARTMENT_OTHER): Payer: Self-pay

## 2022-02-28 DIAGNOSIS — Z1231 Encounter for screening mammogram for malignant neoplasm of breast: Secondary | ICD-10-CM | POA: Diagnosis not present

## 2022-03-17 ENCOUNTER — Other Ambulatory Visit: Payer: Self-pay | Admitting: Internal Medicine

## 2022-03-17 DIAGNOSIS — E1169 Type 2 diabetes mellitus with other specified complication: Secondary | ICD-10-CM

## 2022-03-17 DIAGNOSIS — E1159 Type 2 diabetes mellitus with other circulatory complications: Secondary | ICD-10-CM

## 2022-08-03 ENCOUNTER — Encounter: Payer: BC Managed Care – PPO | Admitting: Internal Medicine

## 2022-08-08 ENCOUNTER — Encounter (INDEPENDENT_AMBULATORY_CARE_PROVIDER_SITE_OTHER): Payer: Self-pay

## 2022-08-22 ENCOUNTER — Encounter: Payer: Self-pay | Admitting: Internal Medicine

## 2022-08-22 NOTE — Patient Instructions (Addendum)
Blood work was ordered.     Medications changes include :   rybelsus 3 mg daily   Your prescription(s) have been sent to your pharmacy.    A referral was ordered for healthy weight and wellness.     Someone from that office will call you to schedule an appointment.     Return in about 6 months (around 02/23/2023) for follow up.   Health Maintenance, Female Adopting a healthy lifestyle and getting preventive care are important in promoting health and wellness. Ask your health care provider about: The right schedule for you to have regular tests and exams. Things you can do on your own to prevent diseases and keep yourself healthy. What should I know about diet, weight, and exercise? Eat a healthy diet  Eat a diet that includes plenty of vegetables, fruits, low-fat dairy products, and lean protein. Do not eat a lot of foods that are high in solid fats, added sugars, or sodium. Maintain a healthy weight Body mass index (BMI) is used to identify weight problems. It estimates body fat based on height and weight. Your health care provider can help determine your BMI and help you achieve or maintain a healthy weight. Get regular exercise Get regular exercise. This is one of the most important things you can do for your health. Most adults should: Exercise for at least 150 minutes each week. The exercise should increase your heart rate and make you sweat (moderate-intensity exercise). Do strengthening exercises at least twice a week. This is in addition to the moderate-intensity exercise. Spend less time sitting. Even light physical activity can be beneficial. Watch cholesterol and blood lipids Have your blood tested for lipids and cholesterol at 60 years of age, then have this test every 5 years. Have your cholesterol levels checked more often if: Your lipid or cholesterol levels are high. You are older than 60 years of age. You are at high risk for heart disease. What should I  know about cancer screening? Depending on your health history and family history, you may need to have cancer screening at various ages. This may include screening for: Breast cancer. Cervical cancer. Colorectal cancer. Skin cancer. Lung cancer. What should I know about heart disease, diabetes, and high blood pressure? Blood pressure and heart disease High blood pressure causes heart disease and increases the risk of stroke. This is more likely to develop in people who have high blood pressure readings or are overweight. Have your blood pressure checked: Every 3-5 years if you are 47-75 years of age. Every year if you are 3 years old or older. Diabetes Have regular diabetes screenings. This checks your fasting blood sugar level. Have the screening done: Once every three years after age 69 if you are at a normal weight and have a low risk for diabetes. More often and at a younger age if you are overweight or have a high risk for diabetes. What should I know about preventing infection? Hepatitis B If you have a higher risk for hepatitis B, you should be screened for this virus. Talk with your health care provider to find out if you are at risk for hepatitis B infection. Hepatitis C Testing is recommended for: Everyone born from [birth year] through 1964. Anyone with known risk factors for hepatitis C. Sexually transmitted infections (STIs) Get screened for STIs, including gonorrhea and chlamydia, if: You are sexually active and are younger than 60 years of age. You are older than 60 years of age  and your health care provider tells you that you are at risk for this type of infection. Your sexual activity has changed since you were last screened, and you are at increased risk for chlamydia or gonorrhea. Ask your health care provider if you are at risk. Ask your health care provider about whether you are at high risk for HIV. Your health care provider may recommend a prescription medicine to help  prevent HIV infection. If you choose to take medicine to prevent HIV, you should first get tested for HIV. You should then be tested every 3 months for as long as you are taking the medicine. Pregnancy If you are about to stop having your period (premenopausal) and you may become pregnant, seek counseling before you get pregnant. Take 400 to 800 micrograms (mcg) of folic acid every day if you become pregnant. Ask for birth control (contraception) if you want to prevent pregnancy. Osteoporosis and menopause Osteoporosis is a disease in which the bones lose minerals and strength with aging. This can result in bone fractures. If you are 32 years old or older, or if you are at risk for osteoporosis and fractures, ask your health care provider if you should: Be screened for bone loss. Take a calcium or vitamin D supplement to lower your risk of fractures. Be given hormone replacement therapy (HRT) to treat symptoms of menopause. Follow these instructions at home: Alcohol use Do not drink alcohol if: Your health care provider tells you not to drink. You are pregnant, may be pregnant, or are planning to become pregnant. If you drink alcohol: Limit how much you have to: 0-1 drink a day. Know how much alcohol is in your drink. In the [place] one drink equals one 12 oz bottle of beer (355 mL), one 5 oz glass of wine (148 mL), or one 1 oz glass of hard liquor (44 mL). Lifestyle Do not use any products that contain nicotine or tobacco. These products include cigarettes, chewing tobacco, and vaping devices, such as e-cigarettes. If you need help quitting, ask your health care provider. Do not use street drugs. Do not share needles. Ask your health care provider for help if you need support or information about quitting drugs. General instructions Schedule regular health, dental, and eye exams. Stay current with your vaccines. Tell your health care provider if: You often feel depressed. You have ever  been abused or do not feel safe at home. Summary Adopting a healthy lifestyle and getting preventive care are important in promoting health and wellness. Follow your health care provider's instructions about healthy diet, exercising, and getting tested or screened for diseases. Follow your health care provider's instructions on monitoring your cholesterol and blood pressure. This information is not intended to replace advice given to you by your health care provider. Make sure you discuss any questions you have with your health care provider. Document Revised: 05/08/2021 Document Reviewed: 05/08/2021 Elsevier Patient Education  [employer].

## 2022-08-22 NOTE — Progress Notes (Unsigned)
Subjective:    Patient ID: Rachel Norris, female    DOB: 03/16/1961, 61 y.o.   MRN: [id number]      HPI [name] is here for a Physical exam.   Her only concern today is that she is gained a lot of the weight that she had lost.  She did get out of her good routine that helped her lose the weight.  Part of that was related to her sister being diagnosed with metastatic cancer and her helping to care for her.  She died earlier this month.  She has gone back to the gym and is starting to get back into healthy habits again.  Her goal is to lose the weight she regained.  She is frustrated that she gained weight.  She would like to go back to the healthy weight and wellness clinic.  She did not do well with the Rybelsus 7 mg-made her little nauseous and had a sick stomach.   Medications and allergies reviewed with patient and updated if appropriate.  Current Outpatient Medications on File Prior to Visit  Medication Sig Dispense Refill   acetaminophen (TYLENOL) 650 MG CR tablet Take 1,300 mg by mouth every 8 (eight) hours as needed for pain.     albuterol (VENTOLIN HFA) 108 (90 Base) MCG/ACT inhaler Inhale 2 puffs into the lungs every 4 (four) hours as needed for wheezing or shortness of breath.      B Complex Vitamins (B COMPLEX 1 PO) Take by mouth.     Collagen-Vitamin C-Biotin (COLLAGEN 1500/C PO) Take by mouth.     diphenhydrAMINE HCl, Sleep, (UNISOM SLEEPGELS) 50 MG CAPS Take 50 mg by mouth at bedtime as needed (sleep).     fexofenadine (ALLEGRA) 180 MG tablet Take 180 mg by mouth daily as needed for allergies or rhinitis.     fluticasone (FLONASE) 50 MCG/ACT nasal spray Place 2 sprays into both nostrils daily as needed for allergies. 16 g 5   Glucosamine-Chondroit-Vit C-Mn (GLUCOSAMINE CHONDR 500 COMPLEX) CAPS See admin instructions.     glucose blood (ACCU-CHEK GUIDE) test strip Use as instructed E11.9 100 strip 12   hydrochlorothiazide (HYDRODIURIL) 25 MG tablet TAKE 1 TABLET BY  MOUTH EVERY DAY 90 tablet 2   hydrocortisone 2.5 % ointment Apply topically.     Lancets (ACCU-CHEK SOFT TOUCH) lancets CHECK BLOOD SUGAR TWICE DAILY 180 each 1   losartan (COZAAR) 100 MG tablet TAKE 1 TABLET BY MOUTH EVERY DAY 90 tablet 2   Menaquinone-7 (VITAMIN K2 PO) Take by mouth.     metFORMIN (GLUCOPHAGE) 500 MG tablet TAKE 1 TABLET BY MOUTH EVERY DAY WITH BREAKFAST 90 tablet 1   omeprazole (PRILOSEC) 40 MG capsule Take 1 capsule (40 mg total) by mouth daily as needed (For heartburn or acid reflux.). 90 capsule 3   Specialty Vitamins Products (VITAMINS FOR HAIR) CAPS Take by mouth.     No current facility-administered medications on file prior to visit.    Review of Systems  Constitutional:  Negative for fever.  Eyes:  Negative for visual disturbance.  Respiratory:  Negative for cough, shortness of breath and wheezing.   Cardiovascular:  Positive for palpitations (occ with caffeine) and leg swelling. Negative for chest pain.  Gastrointestinal:  Positive for nausea (occ). Negative for abdominal pain, blood in stool, constipation and diarrhea.       Occ gerd  Genitourinary:  Negative for dysuria.  Musculoskeletal:  Positive for arthralgias (occ, mild). Negative for back pain.  Skin:  Positive for rash (occ -itchy rash on fingers).  Neurological:  Negative for light-headedness and headaches.  Psychiatric/Behavioral:  Negative for dysphoric mood. The patient is not nervous/anxious.        Objective:   Vitals:   08/23/22 0952  BP: (!) 140/78  Pulse: 66  Temp: 98.2 F (36.8 C)  SpO2: 96%   Filed Weights   08/23/22 0952  Weight: 285 lb (129.3 kg)   Body mass index is 52.13 kg/m.  BP Readings from Last 3 Encounters:  08/23/22 (!) 140/78  01/31/22 130/76  10/03/21 126/82    Wt Readings from Last 3 Encounters:  08/23/22 285 lb (129.3 kg)  01/31/22 263 lb (119.3 kg)  10/03/21 257 lb (116.6 kg)       Physical Exam Constitutional: She appears well-developed and  well-nourished. No distress.  HENT:  Head: Normocephalic and atraumatic.  Right Ear: External ear normal. Normal ear canal and TM Left Ear: External ear normal.  Normal ear canal and TM Mouth/Throat: Oropharynx is clear and moist.  Eyes: Conjunctivae normal.  Neck: Neck supple. No tracheal deviation present. No thyromegaly present.  No carotid bruit  Cardiovascular: Normal rate, regular rhythm and normal heart sounds.   No murmur heard.  No edema. Pulmonary/Chest: Effort normal and breath sounds normal. No respiratory distress. She has no wheezes. She has no rales.  Breast: deferred   Abdominal: Soft. She exhibits no distension. There is no tenderness.  Lymphadenopathy: She has no cervical adenopathy.  Skin: Skin is warm and dry. She is not diaphoretic.  Psychiatric: She has a normal mood and affect. Her behavior is normal.   Diabetic Foot Exam - Simple   Simple Foot Form Diabetic Foot exam was performed with the following findings: Yes   Visual Inspection No deformities, no ulcerations, no other skin breakdown bilaterally: Yes Sensation Testing Intact to touch and monofilament testing bilaterally: Yes Pulse Check Posterior Tibialis and Dorsalis pulse intact bilaterally: Yes Comments      Lab Results  Component Value Date   WBC 6.9 08/23/2022   HGB 13.3 08/23/2022   HCT 39.7 08/23/2022   PLT 300.0 08/23/2022   GLUCOSE 106 (H) 08/23/2022   CHOL 236 (H) 08/23/2022   TRIG 81.0 08/23/2022   HDL 61.80 08/23/2022   LDLDIRECT 144.2 07/20/2013   LDLCALC 158 (H) 08/23/2022   ALT 23 08/23/2022   AST 19 08/23/2022   NA 138 08/23/2022   K 4.2 08/23/2022   CL 100 08/23/2022   CREATININE 0.54 08/23/2022   BUN 18 08/23/2022   CO2 32 08/23/2022   TSH 2.65 01/31/2022   INR 0.9 07/09/2008   HGBA1C 6.2 08/23/2022   MICROALBUR 1.2 08/23/2022         Assessment & Plan:   Physical exam: Screening blood work  ordered Exercise regular - just restarted Weight working on weight  loss and has lost significant amount of weight, but gained it back and now restarting with weight loss efforts Substance abuse  none   Reviewed recommended immunizations.   Health Maintenance  Topic Date Due   OPHTHALMOLOGY EXAM  08/14/2022   COVID-19 Vaccine (4 - Pfizer risk series) 09/08/2022 (Originally 01/03/2021)   INFLUENZA VACCINE  03/31/2023 (Originally 07/31/2022)   HEMOGLOBIN A1C  02/23/2023   MAMMOGRAM  03/01/2023   Diabetic kidney evaluation - GFR measurement  08/24/2023   Diabetic kidney evaluation - Urine ACR  08/24/2023   FOOT EXAM  08/24/2023   PAP SMEAR-Modifier  10/03/2024   COLONOSCOPY (Pts 45-5yr Insurance  coverage will need to be confirmed)  05/15/2026   TETANUS/TDAP  07/29/2031   Hepatitis C Screening  Completed   HIV Screening  Completed   Zoster Vaccines- Shingrix  Completed   HPV VACCINES  Aged Out          See Problem List for Assessment and Plan of chronic medical problems.

## 2022-08-23 ENCOUNTER — Encounter: Payer: Self-pay | Admitting: Internal Medicine

## 2022-08-23 ENCOUNTER — Ambulatory Visit (INDEPENDENT_AMBULATORY_CARE_PROVIDER_SITE_OTHER): Payer: BC Managed Care – PPO | Admitting: Internal Medicine

## 2022-08-23 VITALS — BP 140/78 | HR 66 | Temp 98.2°F | Ht 62.0 in | Wt 285.0 lb

## 2022-08-23 DIAGNOSIS — E1169 Type 2 diabetes mellitus with other specified complication: Secondary | ICD-10-CM

## 2022-08-23 DIAGNOSIS — E1159 Type 2 diabetes mellitus with other circulatory complications: Secondary | ICD-10-CM

## 2022-08-23 DIAGNOSIS — Z6841 Body Mass Index (BMI) 40.0 and over, adult: Secondary | ICD-10-CM

## 2022-08-23 DIAGNOSIS — E785 Hyperlipidemia, unspecified: Secondary | ICD-10-CM

## 2022-08-23 DIAGNOSIS — I152 Hypertension secondary to endocrine disorders: Secondary | ICD-10-CM | POA: Diagnosis not present

## 2022-08-23 DIAGNOSIS — Z0000 Encounter for general adult medical examination without abnormal findings: Secondary | ICD-10-CM

## 2022-08-23 DIAGNOSIS — K219 Gastro-esophageal reflux disease without esophagitis: Secondary | ICD-10-CM | POA: Diagnosis not present

## 2022-08-23 LAB — CBC WITH DIFFERENTIAL/PLATELET
Basophils Absolute: 0.1 10*3/uL (ref 0.0–0.1)
Basophils Relative: 1.1 % (ref 0.0–3.0)
Eosinophils Absolute: 0.4 10*3/uL (ref 0.0–0.7)
Eosinophils Relative: 5.3 % — ABNORMAL HIGH (ref 0.0–5.0)
HCT: 39.7 % (ref 36.0–46.0)
Hemoglobin: 13.3 g/dL (ref 12.0–15.0)
Lymphocytes Relative: 28 % (ref 12.0–46.0)
Lymphs Abs: 1.9 10*3/uL (ref 0.7–4.0)
MCHC: 33.6 g/dL (ref 30.0–36.0)
MCV: 96.6 fl (ref 78.0–100.0)
Monocytes Absolute: 0.7 10*3/uL (ref 0.1–1.0)
Monocytes Relative: 9.7 % (ref 3.0–12.0)
Neutro Abs: 3.8 10*3/uL (ref 1.4–7.7)
Neutrophils Relative %: 55.9 % (ref 43.0–77.0)
Platelets: 300 10*3/uL (ref 150.0–400.0)
RBC: 4.1 Mil/uL (ref 3.87–5.11)
RDW: 12.8 % (ref 11.5–15.5)
WBC: 6.9 10*3/uL (ref 4.0–10.5)

## 2022-08-23 LAB — COMPREHENSIVE METABOLIC PANEL
ALT: 23 U/L (ref 0–35)
AST: 19 U/L (ref 0–37)
Albumin: 4.2 g/dL (ref 3.5–5.2)
Alkaline Phosphatase: 57 U/L (ref 39–117)
BUN: 18 mg/dL (ref 6–23)
CO2: 32 mEq/L (ref 19–32)
Calcium: 9.3 mg/dL (ref 8.4–10.5)
Chloride: 100 mEq/L (ref 96–112)
Creatinine, Ser: 0.54 mg/dL (ref 0.40–1.20)
GFR: 100.61 mL/min (ref >60.00)
Glucose, Bld: 106 mg/dL — ABNORMAL HIGH (ref 70–99)
Potassium: 4.2 mEq/L (ref 3.5–5.1)
Sodium: 138 mEq/L (ref 135–145)
Total Bilirubin: 0.4 mg/dL (ref 0.2–1.2)
Total Protein: 7.1 g/dL (ref 6.0–8.3)

## 2022-08-23 LAB — LIPID PANEL
Cholesterol: 236 mg/dL — ABNORMAL HIGH (ref 0–200)
HDL: 61.8 mg/dL (ref >39.00)
LDL Cholesterol: 158 mg/dL — ABNORMAL HIGH (ref 0–99)
NonHDL: 174.4
Total CHOL/HDL Ratio: 4
Triglycerides: 81 mg/dL (ref 0.0–149.0)
VLDL: 16.2 mg/dL (ref 0.0–40.0)

## 2022-08-23 LAB — HEMOGLOBIN A1C: Hgb A1c MFr Bld: 6.2 % (ref 4.6–6.5)

## 2022-08-23 LAB — MICROALBUMIN / CREATININE URINE RATIO
Creatinine,U: 76 mg/dL
Microalb Creat Ratio: 1.5 mg/g (ref 0.0–30.0)
Microalb, Ur: 1.2 mg/dL (ref 0.0–1.9)

## 2022-08-23 MED ORDER — RYBELSUS 3 MG PO TABS: 3.0000 mg | ORAL_TABLET | Freq: Every day | ORAL | 1 refills | Status: DC

## 2022-08-23 NOTE — Assessment & Plan Note (Signed)
Chronic Blood pressure well controlled CMP Continue HCTZ 25 mg daily, losartan 100 mg daily

## 2022-08-23 NOTE — Assessment & Plan Note (Addendum)
Chronic Unfortunately she did gain back a lot of the weight that she lost, but secondary to her sister being very sick and dying-she is already started back with healthy habits Referral order for healthy weight and wellness Working on weight loss Continue regular exercise-is just restarted back at the gym Continue continue diet high in protein, vegetables and smaller portions.  Low in sugars/carbs Restart Rybelsus, continue metformin

## 2022-08-23 NOTE — Assessment & Plan Note (Addendum)
Chronic  Lab Results  Component Value Date   HGBA1C 5.6 01/31/2022   Sugars very well controlled, but expect sugars to increase secondary to weight gain and being less compliant with a diabetic diet Check A1c, urine microalbumin today Restart Rybelsus 3 mg daily, continue metformin 500 mg daily Stressed regular exercise, diabetic diet

## 2022-08-23 NOTE — Assessment & Plan Note (Signed)
Chronic Regular exercise and healthy diet encouraged Check lipid panel  Not on medication and would like to avoid medication  Lab Results  Component Value Date   LDLCALC 162 (H) 01/31/2022

## 2022-08-23 NOTE — Assessment & Plan Note (Signed)
Chronic GERD controlled Continue omeprazole 40 mg daily as needed 

## 2022-09-26 ENCOUNTER — Ambulatory Visit (INDEPENDENT_AMBULATORY_CARE_PROVIDER_SITE_OTHER): Payer: BC Managed Care – PPO | Admitting: Internal Medicine

## 2022-09-26 VITALS — BP 121/75 | HR 75 | Temp 97.5°F | Ht 63.0 in | Wt 283.0 lb

## 2022-09-26 DIAGNOSIS — Z6841 Body Mass Index (BMI) 40.0 and over, adult: Secondary | ICD-10-CM

## 2022-09-26 DIAGNOSIS — R5383 Other fatigue: Secondary | ICD-10-CM

## 2022-09-26 DIAGNOSIS — Z0289 Encounter for other administrative examinations: Secondary | ICD-10-CM

## 2022-09-26 NOTE — Progress Notes (Signed)
Office: [phone]  /  Fax: [phone]  Initial Visit  Rachel Norris was seen in clinic today to evaluate for obesity. She is interested in losing weight to improve overall health and reduce the risk of weight related complications. She was referred by PCP. Was a former patient. She encountered some family emergencies this last year and was unable to stay on course. She has not been exercising and although mindful about her eating acknowledges needing help with accountability, portion control and structure. She may also not be getting enough protein. She would like to lose weight again as she felt great and more energetic. She endorses fatigue throughout the day.   She presents today to review program treatment options, initial physical assessment, and evaluation.      Past medical history includes:   Past Medical History:  Diagnosis Date   Allergic rhinitis    Ankle swelling    Anxiety    Anxiety and depression    Arthritis    generalized arthritis -knees, feet.back.    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 - no Inhalers used daily.   De Quervain's tenosynovitis, right    Depression    Diabetes mellitus without complication ([place])    "pre diabetes"   Edema, lower extremity    Fibroid    GERD (gastroesophageal reflux disease)    Heart palpitations    Hormone disorder    Hypertension    Irregular menstrual bleeding    Joint pain    Labial cyst    bilateral icclusion   Left knee pain    Obesity    Palpitations    Perimenopausal    Plantar fasciitis, bilateral    PONV (postoperative nausea and vomiting)    Pre-diabetes    Prediabetes    Shortness of breath dyspnea    with exertion, climbing a flight of stairs   Tendonitis, Achilles, left    Thickened endometrium    Wears glasses      Objective:   BP  121/75   Pulse 75   Temp (!) 97.5 F (36.4 C)   Ht '5\' 3"'$  (1.6 m)   Wt 283 lb (128.4 kg)   LMP 09/15/2016   SpO2 94%   BMI 50.13 kg/m  She was weighed on the bioimpedance scale:  Body mass index is 50.13 kg/m.  General:  Alert, oriented and cooperative. Patient is in no acute distress.  Respiratory: Normal respiratory effort, no problems with respiration noted  Extremities: Normal range of motion.    Mental Status: Normal mood and affect. Normal behavior. Normal judgment and thought content.   Assessment and Plan:  1. Class 3 severe obesity with serious comorbidity and body mass index (BMI) of 50.0 to 59.9 in adult, unspecified obesity type ([place])  2. Other fatigue - EKG 12-Lead      Obesity Treatment Plan:  She will work on garnering support from family and friends to begin weight loss journey. Work on eliminating or reducing the presence of highly processed, calorie dense foods in the home. Complete provided nutritional and psychosocial assessment questionnaire.    Rachel Norris will follow up in the next 1-2 weeks to review the above steps and continue evaluation and treatment.  Obesity Education Performed Today:  She was weighed on the bioimpedance scale and results were discussed and documented in the synopsis.  We discussed obesity as a disease and the importance of a more detailed evaluation of all the factors contributing to the disease.  We discussed the importance of long term lifestyle changes which include nutrition, exercise and behavioral modifications as well as the importance of customizing this to her specific health and social needs.  We discussed the benefits of reaching a healthier weight to alleviate the symptoms of existing conditions and reduce the risks of the biomechanical, metabolic and psychological effects of obesity.  We discussed the goals of this program is to improve her overall health and not simply achieve a specific BMI.  Frequent  visits are very important to patient success. I plan to see her every 2 weeks for the first 3 months and then evaluate the visit frequency after that time. I explained obesity is a life-long chronic disease and long term treatments would be required. Medications to help her follow his eating plan may be offered as appropriate but are not required. All medication decisions will be made together after the initial workup is done and benefits and side effects are discussed in depth.  The clinic rules were reviewed including the late policy, cancellation policy, no show and program fees.  Rachel Norris appears to be in the action stage of change and states they are ready to start intensive lifestyle modifications and behavioral modifications.  30 minutes was spent today on this visit including the above counseling, pre-visit chart review, and post-visit documentation.  Thomes Dinning, MD

## 2022-10-01 NOTE — Progress Notes (Signed)
61 y.o. G0P0000 Married White or Caucasian Not Hispanic or Latino female here for annual exam.  No vaginal bleeding. Not sexually active, no libido, fine with it.   She has vaginal itching at times, not currently.    Her 45 year old sister died in August 09, 2024 of metastatic lung cancer (smoker). Diagnosed in July. They were very close, misses her so much.   She is one of 8 kids. 2 sisters have passed.   8 year old niece recently diagnosed with DCIS.   H/O DM, HgbA1C was 6.2 in 8/23.   She has gained weight this year. She has rejoined healthy weight and wellness and will start going back to the gym.   Patient's last menstrual period was 09/15/2016.          Sexually active: No.  The current method of family planning is post menopausal status.    Exercising: No.  The patient does not participate in regular exercise at present. Smoker:  no  Health Maintenance: Pap:  10/03/21 WNL, neg HPV; 08/25/20 WNL Hr HPV +,  03/16/16 WNL HR Hpv Neg  History of abnormal Pap:  yes 2021 HPV +, laser surgery of her cervix in 1991 MMG:  02/28/22 density B Bi-rads 1 neg   BMD:   none  Colonoscopy: 05/15/16 normal, f/u in 10 years.  TDaP:  07/28/21  Gardasil: n/a   reports that she quit smoking about 26 years ago. Her smoking use included cigarettes. She has a 20.00 pack-year smoking history. She has never used smokeless tobacco. She reports current alcohol use. She reports that she does not use drugs. Drinks 2 x a month, less than one glass of wine. 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 Husband also retired.   Past Medical History:  Diagnosis Date   Allergic rhinitis    Ankle swelling    Anxiety    Anxiety and depression    Arthritis    generalized arthritis -knees, feet.back.    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 - no Inhalers used daily.   De  Quervain's tenosynovitis, right    Depression    Diabetes mellitus without complication (HCC)    "pre diabetes"   Edema, lower extremity    Fibroid    GERD (gastroesophageal reflux disease)    Heart palpitations    Hormone disorder    Hypertension    Irregular menstrual bleeding    Joint pain    Labial cyst    bilateral icclusion   Left knee pain    Obesity    Palpitations    Perimenopausal    Plantar fasciitis, bilateral    PONV (postoperative nausea and vomiting)    Pre-diabetes    Prediabetes    Shortness of breath dyspnea    with exertion, climbing a flight of stairs   Tendonitis, Achilles, left    Thickened endometrium    Wears glasses     Past Surgical History:  Procedure Laterality Date   COLONOSCOPY WITH PROPOFOL N/A 05/15/2016   Procedure: COLONOSCOPY WITH PROPOFOL;  Surgeon: Napoleon Form, MD;  Location: [facility];  Service: Endoscopy;  Laterality: N/A;   DILATATION & CURETTAGE/HYSTEROSCOPY WITH MYOSURE N/A 08/30/2016   Procedure:  DILATATION & CURETTAGE/HYSTEROSCOPY WITH MYOSURE;  Surgeon: Romualdo Bolk, MD;  Location: [facility];  Service: Gynecology;  Laterality: N/A;  hysteroscopic myomectomy. BMI 53.8   DILATION AND CURETTAGE OF UTERUS     EAR CYST EXCISION Bilateral 04/08/2015   Procedure: removal of bilateral labial inclusion cysts;  Surgeon: Mitchel Honour, DO;  Location: [facility];  Service: Gynecology;  Laterality: Bilateral;   HYSTEROSCOPY     HYSTEROSCOPY WITH D & C N/A 04/08/2015   Procedure: DILATATION AND CURETTAGE /HYSTEROSCOPY, removal of bilateral labial inclusion cysts;  Surgeon: Mitchel Honour, DO;  Location: [facility];  Service: Gynecology;  Laterality: N/A;   KNEE ARTHROSCOPY WITH MEDIAL MENISECTOMY Left 11/09/2019   Procedure: ARTHROSCOPY KNEE WITH PARTIAL MEDIAL MENISECTOMY, PARTIAL LATERAL MENISECTOMY, MEDIAL CHONDROPLASTY, MEDIAL PLICA EXCISION AND PATELLA FEMORAL CHONDROPLASTY;  Surgeon: Jodi Geralds, MD;  Location: [facility];  Service: Orthopedics;  Laterality: Left;   LASER ABLATION OF THE CERVIX  1992   DYSPLAGIA   LASER ABLATION OF THE CERVIX     1991   MYOMECTOMY N/A 08/30/2016   Procedure: HYSTEROSCOPIC MYOMECTOMY;  Surgeon: Romualdo Bolk, MD;  Location: [facility];  Service: Gynecology;  Laterality: N/A;   SKIN CANCER EXCISION Left    upper chest   VIDEO ASSISTED THORACOSCOPY (VATS)/THOROCOTOMY Right 07-01-2008   dr gerhardt   w/ 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     albuterol (VENTOLIN HFA) 108 (90 Base) MCG/ACT inhaler Inhale 2 puffs into the lungs every 4 (four) hours as needed for wheezing or shortness of breath.      B Complex Vitamins (B COMPLEX 1 PO) Take by mouth.     Collagen-Vitamin C-Biotin (COLLAGEN 1500/C PO) Take by mouth.     diphenhydrAMINE HCl, Sleep, (UNISOM SLEEPGELS) 50 MG CAPS Take 50 mg by mouth at bedtime as needed (sleep).     fexofenadine (ALLEGRA) 180 MG tablet Take 180 mg by mouth daily as needed for allergies or rhinitis.     fluticasone (FLONASE) 50 MCG/ACT nasal spray Place 2 sprays into both nostrils daily as needed for allergies. 16 g 5   glucose blood (ACCU-CHEK GUIDE) test strip Use as instructed E11.9 100 strip 12   hydrochlorothiazide (HYDRODIURIL) 25 MG tablet TAKE 1 TABLET BY MOUTH EVERY DAY 90 tablet 2   Lancets (ACCU-CHEK SOFT TOUCH) lancets CHECK BLOOD SUGAR TWICE DAILY 180 each 1   losartan (COZAAR) 100 MG tablet TAKE 1 TABLET BY MOUTH EVERY DAY 90 tablet 2   metFORMIN (GLUCOPHAGE) 500 MG tablet TAKE 1 TABLET BY MOUTH EVERY DAY WITH BREAKFAST 90 tablet 1   omeprazole (PRILOSEC) 40 MG capsule Take 1 capsule (40 mg total) by mouth daily as needed (For heartburn or acid reflux.). 90 capsule 3   Semaglutide (RYBELSUS) 3 MG TABS Take  3 mg by mouth daily. 90 tablet 1   Specialty Vitamins Products (VITAMINS FOR HAIR) CAPS Take by mouth.     VITAMIN D-VITAMIN K PO Take by mouth.     No current facility-administered medications for this visit.    Family History  Problem Relation Age of Onset   Diabetes Mother        M anmd others   Dementia Mother    Stroke Mother    Anxiety disorder Mother  Stroke Father    AAA (abdominal aortic aneurysm) Father    Hypertension Father    Hyperlipidemia Father    Heart disease Father    Atrial fibrillation Sister    Asthma Sister    Allergic rhinitis Sister    Bronchitis Sister    Angioedema Sister    Lung cancer Sister    Allergic rhinitis Sister    Bronchitis Sister    Allergic rhinitis Brother    Asthma Other    Colon cancer Neg Hx    Breast cancer Neg Hx    Coronary artery disease Neg Hx    Eczema Neg Hx    Urticaria Neg Hx    Immunodeficiency Neg Hx     Review of Systems  Exam:   BP 126/74   Pulse 78   Ht 5' 2.21" (1.58 m)   Wt 286 lb (129.7 kg)   LMP 09/15/2016   SpO2 100%   BMI 51.97 kg/m   Weight change: @WEIGHTCHANGE @ Height:   Height: 5' 2.21" (158 cm)  Ht Readings from Last 3 Encounters:  10/04/22 5' 2.21" (1.58 m)  09/26/22 5\' 3"  (1.6 m)  08/23/22 5\' 2"  (1.575 m)    General appearance: alert, cooperative and appears stated age Head: Normocephalic, without obvious abnormality, atraumatic Neck: no adenopathy, supple, symmetrical, trachea midline and thyroid normal to inspection and palpation Lungs: clear to auscultation bilaterally Cardiovascular: regular rate and rhythm Breasts: normal appearance, no masses or tenderness Abdomen: soft, non-tender; non distended,  no masses,  no organomegaly Extremities: extremities normal, atraumatic, no cyanosis or edema Skin: Skin color, texture, turgor normal. No rashes or lesions Lymph nodes: Cervical, supraclavicular, and axillary nodes normal. No abnormal inguinal nodes palpated Neurologic: Grossly  normal   Pelvic: External genitalia:  no lesions              Urethra:  normal appearing urethra with no masses, tenderness or lesions              Bartholins and Skenes: normal                 Vagina: normal appearing vagina with normal color and discharge, no lesions              Cervix: no lesions               Bimanual Exam:  Uterus:   no masses or tenderness              Adnexa: no mass, fullness, tenderness               Rectovaginal: Confirms               Anus:  normal sphincter tone, no lesions  Carolynn Serve, CMA chaperoned for the exam.  1. Well woman exam Discussed breast self exam Discussed calcium and vit D intake Labs with primary Mammogram and colonoscopy UTD  2. Screening for cervical cancer - Cytology - PAP

## 2022-10-04 ENCOUNTER — Ambulatory Visit (INDEPENDENT_AMBULATORY_CARE_PROVIDER_SITE_OTHER): Payer: BC Managed Care – PPO | Admitting: Obstetrics and Gynecology

## 2022-10-04 ENCOUNTER — Other Ambulatory Visit (HOSPITAL_COMMUNITY)
Admission: RE | Admit: 2022-10-04 | Discharge: 2022-10-04 | Disposition: A | Discharge status: Home / Self Care | Payer: BC Managed Care – PPO | Source: Ambulatory Visit | Attending: Obstetrics and Gynecology | Admitting: Obstetrics and Gynecology

## 2022-10-04 ENCOUNTER — Encounter: Payer: Self-pay | Admitting: Obstetrics and Gynecology

## 2022-10-04 VITALS — BP 126/74 | HR 78 | Ht 62.21 in | Wt 286.0 lb

## 2022-10-04 DIAGNOSIS — Z85828 Personal history of other malignant neoplasm of skin: Secondary | ICD-10-CM | POA: Insufficient documentation

## 2022-10-04 DIAGNOSIS — Z01419 Encounter for gynecological examination (general) (routine) without abnormal findings: Secondary | ICD-10-CM | POA: Diagnosis not present

## 2022-10-04 DIAGNOSIS — Z124 Encounter for screening for malignant neoplasm of cervix: Secondary | ICD-10-CM

## 2022-10-04 NOTE — Patient Instructions (Signed)

## 2022-10-08 ENCOUNTER — Ambulatory Visit (INDEPENDENT_AMBULATORY_CARE_PROVIDER_SITE_OTHER): Payer: BC Managed Care – PPO | Admitting: Internal Medicine

## 2022-10-08 ENCOUNTER — Encounter (INDEPENDENT_AMBULATORY_CARE_PROVIDER_SITE_OTHER): Payer: Self-pay | Admitting: Internal Medicine

## 2022-10-08 VITALS — BP 133/83 | HR 71 | Temp 98.0°F | Ht 63.0 in | Wt 284.6 lb

## 2022-10-08 DIAGNOSIS — Z7984 Long term (current) use of oral hypoglycemic drugs: Secondary | ICD-10-CM

## 2022-10-08 DIAGNOSIS — I10 Essential (primary) hypertension: Secondary | ICD-10-CM | POA: Diagnosis not present

## 2022-10-08 DIAGNOSIS — Z1331 Encounter for screening for depression: Secondary | ICD-10-CM | POA: Diagnosis not present

## 2022-10-08 DIAGNOSIS — E7849 Other hyperlipidemia: Secondary | ICD-10-CM | POA: Diagnosis not present

## 2022-10-08 DIAGNOSIS — E559 Vitamin D deficiency, unspecified: Secondary | ICD-10-CM | POA: Diagnosis not present

## 2022-10-08 DIAGNOSIS — R0602 Shortness of breath: Secondary | ICD-10-CM | POA: Insufficient documentation

## 2022-10-08 DIAGNOSIS — E119 Type 2 diabetes mellitus without complications: Secondary | ICD-10-CM

## 2022-10-08 DIAGNOSIS — R5383 Other fatigue: Secondary | ICD-10-CM | POA: Diagnosis not present

## 2022-10-08 DIAGNOSIS — Z6841 Body Mass Index (BMI) 40.0 and over, adult: Secondary | ICD-10-CM

## 2022-10-08 LAB — CYTOLOGY - PAP
Comment: NEGATIVE
Diagnosis: NEGATIVE
High risk HPV: NEGATIVE

## 2022-10-09 LAB — VITAMIN D 25 HYDROXY (VIT D DEFICIENCY, FRACTURES): Vit D, 25-Hydroxy: 41.8 ng/mL (ref 30.0–100.0)

## 2022-10-09 LAB — VITAMIN B12: Vitamin B-12: 573 pg/mL (ref 232–1245)

## 2022-10-17 NOTE — Progress Notes (Signed)
Chief Complaint:   OBESITY Rachel Norris (MR# [id number]) is a 61 y.o. female who presents for evaluation and treatment of obesity and related comorbidities. Current BMI is Body mass index is 50.41 kg/m. Rachel Norris has been struggling with her weight for many years and has been unsuccessful in either losing weight, maintaining weight loss, or reaching her healthy weight goal.  Rachel Norris is currently in the action stage of change and ready to dedicate time achieving and maintaining a healthier weight. Rachel Norris is interested in becoming our patient and working on intensive lifestyle modifications including (but not limited to) diet and exercise for weight loss.  Rachel Norris's habits were reviewed today and are as follows: Her family eats meals together, she thinks her family will eat healthier with her, her desired weight loss is 84+, she has been heavy most of her life, she started gaining weight in her 30's-40's, her heaviest weight ever was 319 pounds, she is a picky eater and doesn't like to eat healthier foods, she has significant food cravings issues, she snacks frequently in the evenings, she wakes up frequently in the middle of the night to eat, she skips meals frequently, she is frequently drinking liquids with calories, she frequently makes poor food choices, she frequently eats larger portions than normal, and she struggles with emotional eating.  Depression Screen Rachel Norris's Food and Mood (modified PHQ-9) score was 8.     10/08/2022    9:23 AM  Depression screen PHQ 2/9  Decreased Interest 1  Down, Depressed, Hopeless 2  PHQ - 2 Score 3  Altered sleeping 1  Tired, decreased energy 1  Change in appetite 2  Feeling bad or failure about yourself  1  Trouble concentrating 0  Moving slowly or fidgety/restless 0  Suicidal thoughts 0  PHQ-9 Score 8  Difficult doing work/chores Not difficult at all   Subjective:   1. Other fatigue Vita admits to daytime somnolence and admits to waking up  still tired. Patient has a history of symptoms of daytime fatigue and morning fatigue. Rachel Norris generally gets 6 or 7 hours of sleep per night, and states that she has nightime awakenings and generally restful sleep. Snoring is not present. Apneic episodes are not present. [name] Sleepiness Score is 5. EKG normal sinus rhythm without acute ischemic changes.  Reviewed recent labs, CMP, CBC, and TSH within normal limits.  2. SOB (shortness of breath) Rachel Norris notes increasing shortness of breath with exercising and seems to be worsening over time with weight gain. She notes getting out of breath sooner with activity than she used to. This has not gotten worse recently. Rachel Norris denies shortness of breath at rest or orthopnea.  3. Type 2 diabetes mellitus without complication, without long-term current use of insulin (HCC) Rachel Norris's previous A1c was 6.2 in 07/2022, and at goal.  She is on Rybelsus and metformin without adverse effects.  I discussed labs with the patient today.  4. Hypertension, essential Rachel Norris's level is close to goal.  She is on hydrochlorothiazide and ARB. Her recent renal parameters were within normal limits, with no adverse effects.  5. Vitamin D deficiency Rachel Norris's most recent vitamin D level was 46.  Diagnosis associated with adiposity, leptin resistance, and adipogenesis.  I discussed labs with the patient today.  6. Other hyperlipidemia Rachel Norris's recent LDL was 158, with elevated ASCVD risk score.  I discussed labs with the patient today.  Assessment/Plan:   1. Other fatigue Rachel Norris does feel that her weight is causing her  energy to be lower than it should be. Fatigue may be related to obesity, depression or many other causes. Labs will be ordered, and in the meanwhile, [name] will focus on self care including making healthy food choices, increasing physical activity and focusing on stress reduction.  - Vitamin B12  2. SOB (shortness of breath) Rachel Norris does feel that she gets out of  breath more easily that she used to when she exercises. Rachel Norris's shortness of breath appears to be obesity related and exercise induced. She has agreed to work on weight loss and gradually increase exercise to treat her exercise induced shortness of breath. Will continue to monitor closely.  3. Type 2 diabetes mellitus without complication, without long-term current use of insulin (HCC) Rachel Norris will work on her weight loss therapy with her low-carb, high-protein diet.  She will discuss with PCP to consider switching to Ozempic if possible.  This is a more effective agent for weight loss and appetite suppression because of its hypothalamic affinity in comparison to oral agent.  4. Hypertension, essential Blood pressure is well controlled.  Rachel Norris will work on her weight loss therapy, and continue her blood pressure medications.  5. Vitamin D deficiency We will check labs today, and will supplement as needed for a goal of 50-60.  - VITAMIN D 25 Hydroxy (Vit-D Deficiency, Fractures)  6. Other hyperlipidemia Rachel Norris was counseled on low-fat diet and weight loss therapy.  She will benefit from statin for cardiovascular risk reduction but was declined by the patient.  7. Depression screening Rachel Norris had a positive depression screening. Depression is commonly associated with obesity and often results in emotional eating behaviors. We will monitor this closely and work on CBT to help improve the non-hunger eating patterns. Referral to Psychology may be required if no improvement is seen as she continues in our clinic.  8. Class 3 severe obesity without serious comorbidity with body mass index (BMI) of 50.0 to 59.9 in adult, unspecified obesity type [facility]) [place] is currently in the action stage of change and her goal is to continue with weight loss efforts. I recommend Gracilyn begin the structured treatment plan as follows:  She has agreed to the Category 3 Plan.  Exercise goals: [employer].     Behavioral modification strategies: increasing lean protein intake, decreasing simple carbohydrates, increasing water intake, no skipping meals, meal planning and cooking strategies, keeping healthy foods in the home, ways to avoid boredom eating, better snacking choices, and planning for success.  She was informed of the importance of frequent follow-up visits to maximize her success with intensive lifestyle modifications for her multiple health conditions. She was informed we would discuss her lab results at her next visit unless there is a critical issue that needs to be addressed sooner. Elisabella agreed to keep her next visit at the agreed upon time to discuss these results.  Objective:   Blood pressure 133/83, pulse 71, temperature 98 F (36.7 C), height '5\' 3"'$  (1.6 m), weight 284 lb 9.6 oz (129.1 kg), last menstrual period 09/15/2016, SpO2 94 %. Body mass index is 50.41 kg/m.  EKG: Normal sinus rhythm, rate 73 BPM.  Indirect Calorimeter completed today shows a VO2 of 314 and a REE of 2174.  Her calculated basal metabolic rate is 7782 thus her basal metabolic rate is better than expected.  General: Cooperative, alert, well developed, in no acute distress. HEENT: Conjunctivae and lids unremarkable. Cardiovascular: Regular rhythm.  Lungs: Normal work of breathing. Neurologic: No focal deficits.  Lab Results  Component Value Date   CREATININE 0.54 08/23/2022   BUN 18 08/23/2022   NA 138 08/23/2022   K 4.2 08/23/2022   CL 100 08/23/2022   CO2 32 08/23/2022   Lab Results  Component Value Date   ALT 23 08/23/2022   AST 19 08/23/2022   ALKPHOS 57 08/23/2022   BILITOT 0.4 08/23/2022   Lab Results  Component Value Date   HGBA1C 6.2 08/23/2022   HGBA1C 5.6 01/31/2022   HGBA1C 5.7 07/28/2021   HGBA1C 5.7 01/26/2021   HGBA1C 5.7 (H) 07/26/2020   Lab Results  Component Value Date   INSULIN 8.8 06/08/2020   INSULIN 13.7 02/25/2020   Lab Results  Component Value Date   TSH  2.65 01/31/2022   Lab Results  Component Value Date   CHOL 236 (H) 08/23/2022   HDL 61.80 08/23/2022   LDLCALC 158 (H) 08/23/2022   LDLDIRECT 144.2 07/20/2013   TRIG 81.0 08/23/2022   CHOLHDL 4 08/23/2022   Lab Results  Component Value Date   WBC 6.9 08/23/2022   HGB 13.3 08/23/2022   HCT 39.7 08/23/2022   MCV 96.6 08/23/2022   PLT 300.0 08/23/2022   Lab Results  Component Value Date   FERRITIN 32 03/26/2016   Attestation Statements:   Reviewed by clinician on day of visit: allergies, medications, problem list, medical history, surgical history, family history, social history, and previous encounter notes.  Time spent on visit including pre-visit chart review and post-visit charting and care was 40 minutes.   Wilhemena Durie, am acting as transcriptionist for Thomes Dinning, MD.  I have reviewed the above documentation for accuracy and completeness, and I agree with the above. -Thomes Dinning, MD

## 2022-10-22 ENCOUNTER — Encounter (INDEPENDENT_AMBULATORY_CARE_PROVIDER_SITE_OTHER): Payer: Self-pay | Admitting: Internal Medicine

## 2022-10-22 ENCOUNTER — Ambulatory Visit (INDEPENDENT_AMBULATORY_CARE_PROVIDER_SITE_OTHER): Payer: BC Managed Care – PPO | Admitting: Internal Medicine

## 2022-10-22 VITALS — BP 139/70 | HR 57 | Temp 98.1°F | Ht 63.0 in | Wt 282.0 lb

## 2022-10-22 DIAGNOSIS — E1169 Type 2 diabetes mellitus with other specified complication: Secondary | ICD-10-CM | POA: Insufficient documentation

## 2022-10-22 DIAGNOSIS — E669 Obesity, unspecified: Secondary | ICD-10-CM | POA: Diagnosis not present

## 2022-10-22 DIAGNOSIS — Z6841 Body Mass Index (BMI) 40.0 and over, adult: Secondary | ICD-10-CM | POA: Diagnosis not present

## 2022-10-22 DIAGNOSIS — E559 Vitamin D deficiency, unspecified: Secondary | ICD-10-CM

## 2022-10-22 DIAGNOSIS — Z7984 Long term (current) use of oral hypoglycemic drugs: Secondary | ICD-10-CM

## 2022-10-22 DIAGNOSIS — E118 Type 2 diabetes mellitus with unspecified complications: Secondary | ICD-10-CM

## 2022-10-31 NOTE — Progress Notes (Unsigned)
Chief Complaint:   OBESITY Rachel Norris is here to discuss her progress with her obesity treatment plan along with follow-up of her obesity related diagnoses. Rachel Norris is on the Category 3 Plan and states she is following her eating plan approximately 90% of the time. Rachel Norris states she is doing strength training for 45-60 minutes 4-5 times per week.  Today's visit was #: 2 Starting weight: 284 lbs Starting date: 10/08/2022 Today's weight: 282 lbs Today's date: 10/22/2022 Total lbs lost to date: 2 Total lbs lost since last in-office visit: 2  Interim History: Rachel Norris has started exercising.  She feels more energetic and better.  She notes decreased hunger and cravings.  She is traveling to the beach and she is working on meal planning.  She is making healthier choices and reading labels.  She has increased her awareness and doing less mindless eating.  Subjective:   1. Type 2 diabetes mellitus with complication, without long-term current use of insulin (HCC) Rachel Norris's recent A1c was 6.2 and HBGM was 10 2 in the AM.  She denies hypoglycemia.  She is on Rybelsus and metformin.  Her B12 level was within normal limits.  I discussed labs with the patient today.  2. Vitamin D deficiency Rachel Norris's vitamin D level is 25.7, recently was 41.8 with a goal of 50-60.  I discussed labs with the patient today.  Assessment/Plan:   1. Type 2 diabetes mellitus with complication, without long-term current use of insulin (HCC) Rachel Norris will continue with her weight loss therapy, and will continue Rybelsus and metformin per her PCP.  2. Vitamin D deficiency Rachel Norris will continue OTC vitamin D, and will increase to 2 tablets daily.  3. Obesity with current BMI 50.0 Rachel Norris is currently in the action stage of change. As such, her goal is to continue with weight loss efforts. She has agreed to the Category 3 Plan with journaling 2-3 times per week with [employer].   Exercise goals: As is.   Behavioral modification  strategies: increasing lean protein intake, no skipping meals, meal planning and cooking strategies, keeping healthy foods in the home, ways to avoid boredom eating, emotional eating strategies, travel eating strategies, planning for success, and keeping a strict food journal.  Rachel Norris has agreed to follow-up with our clinic in 2 weeks. She was informed of the importance of frequent follow-up visits to maximize her success with intensive lifestyle modifications for her multiple health conditions.   Objective:   Blood pressure 139/70, pulse (!) 57, temperature 98.1 F (36.7 C), height '5\' 3"'$  (1.6 m), weight 282 lb (127.9 kg), last menstrual period 09/15/2016, SpO2 96 %. Body mass index is 49.95 kg/m.  General: Cooperative, alert, well developed, in no acute distress. HEENT: Conjunctivae and lids unremarkable. Cardiovascular: Regular rhythm.  Lungs: Normal work of breathing. Neurologic: No focal deficits.   Lab Results  Component Value Date   CREATININE 0.54 08/23/2022   BUN 18 08/23/2022   NA 138 08/23/2022   K 4.2 08/23/2022   CL 100 08/23/2022   CO2 32 08/23/2022   Lab Results  Component Value Date   ALT 23 08/23/2022   AST 19 08/23/2022   ALKPHOS 57 08/23/2022   BILITOT 0.4 08/23/2022   Lab Results  Component Value Date   HGBA1C 6.2 08/23/2022   HGBA1C 5.6 01/31/2022   HGBA1C 5.7 07/28/2021   HGBA1C 5.7 01/26/2021   HGBA1C 5.7 (H) 07/26/2020   Lab Results  Component Value Date   INSULIN 8.8 06/08/2020  INSULIN 13.7 02/25/2020   Lab Results  Component Value Date   TSH 2.65 01/31/2022   Lab Results  Component Value Date   CHOL 236 (H) 08/23/2022   HDL 61.80 08/23/2022   LDLCALC 158 (H) 08/23/2022   LDLDIRECT 144.2 07/20/2013   TRIG 81.0 08/23/2022   CHOLHDL 4 08/23/2022   Lab Results  Component Value Date   VD25OH 41.8 10/08/2022   VD25OH 46.02 01/31/2022   VD25OH 65.45 01/26/2021   Lab Results  Component Value Date   WBC 6.9 08/23/2022   HGB 13.3  08/23/2022   HCT 39.7 08/23/2022   MCV 96.6 08/23/2022   PLT 300.0 08/23/2022   Lab Results  Component Value Date   FERRITIN 32 03/26/2016   Attestation Statements:   Reviewed by clinician on day of visit: allergies, medications, problem list, medical history, surgical history, family history, social history, and previous encounter notes.  Time spent on visit including pre-visit chart review and post-visit care and charting was 40 minutes.   I, Trixie Dredge, am acting as transcriptionist for Thomes Dinning, MD.  I have reviewed the above documentation for accuracy and completeness, and I agree with the above. -  ***

## 2022-11-06 ENCOUNTER — Encounter (INDEPENDENT_AMBULATORY_CARE_PROVIDER_SITE_OTHER): Payer: Self-pay | Admitting: Internal Medicine

## 2022-11-06 ENCOUNTER — Ambulatory Visit (INDEPENDENT_AMBULATORY_CARE_PROVIDER_SITE_OTHER): Payer: BC Managed Care – PPO | Admitting: Internal Medicine

## 2022-11-06 VITALS — BP 125/81 | HR 63 | Temp 97.8°F | Ht 63.0 in | Wt 281.0 lb

## 2022-11-06 DIAGNOSIS — Z6841 Body Mass Index (BMI) 40.0 and over, adult: Secondary | ICD-10-CM

## 2022-11-06 DIAGNOSIS — E118 Type 2 diabetes mellitus with unspecified complications: Secondary | ICD-10-CM

## 2022-11-06 DIAGNOSIS — E669 Obesity, unspecified: Secondary | ICD-10-CM | POA: Diagnosis not present

## 2022-11-06 DIAGNOSIS — Z7984 Long term (current) use of oral hypoglycemic drugs: Secondary | ICD-10-CM

## 2022-11-14 LAB — HM DIABETES EYE EXAM

## 2022-11-15 ENCOUNTER — Encounter: Payer: Self-pay | Admitting: Internal Medicine

## 2022-11-15 NOTE — Progress Notes (Signed)
Outside notes received. Information abstracted. Notes sent to scan.  

## 2022-11-23 ENCOUNTER — Other Ambulatory Visit: Payer: Self-pay | Admitting: Internal Medicine

## 2022-11-23 DIAGNOSIS — E1159 Type 2 diabetes mellitus with other circulatory complications: Secondary | ICD-10-CM

## 2022-11-25 NOTE — Progress Notes (Signed)
Chief Complaint:   OBESITY Rachel Norris is here to discuss her progress with her obesity treatment plan along with follow-up of her obesity related diagnoses. Rachel Norris is on the Category 3 Plan with journaling 2-3 times per week and states she is following her eating plan approximately 75% of the time. Rachel Norris states she is doing strength training 60-90 minutes 5 times per week.  Today's visit was #: 3 Starting weight: 284 lbs Starting date: 10/08/2022 Today's weight: 281 lbs Today's date: 11/06/2022 Total lbs lost to date: 3 Total lbs lost since last in-office visit: 1  Interim History: Rachel Norris has recently returned from vacation and has been gradually incorporating meal plan. She has reduced sweets and peanut butter from her diet. Pt notes nighttime cravings, which she manages with water. She has lost weight despite travel and lapses. Overall, pt is making healthier choices.  Subjective:   1. Type 2 diabetes mellitus with complication, without long-term current use of insulin (Rachel Norris) Discussed labs with patient today. Pt's A1c is 6.2, and she is on Rybelsus '3mg'$  and Metformin. She did not tolerate the '7mg'$  dose, but is willing to try as nutritional intake has changed.  Assessment/Plan:   1. Type 2 diabetes mellitus with complication, without long-term current use of insulin (HCC) Try Rybelsus '7mg'$  and continue Metformin. Consider switching to Ozempic at next refill, if it is okay with PCP, due to enhanced appetite suppression.  2. Obesity with current BMI 49.8 Rachel Norris is currently in the action stage of change. As such, her goal is to continue with weight loss efforts. She has agreed to the Category 3 Plan.   Exercise goals:  As is  Behavioral modification strategies: increasing lean protein intake, decreasing simple carbohydrates, increasing water intake, meal planning and cooking strategies, keeping healthy foods in the home, ways to avoid night time snacking, emotional eating strategies,  avoiding temptations, planning for success, and keeping a strict food journal.  Rachel Norris has agreed to follow-up with our clinic in 2 weeks. She was informed of the importance of frequent follow-up visits to maximize her success with intensive lifestyle modifications for her multiple health conditions.   Objective:   Blood pressure 125/81, pulse 63, temperature 97.8 F (36.6 C), height '5\' 3"'$  (1.6 m), weight 281 lb (127.5 kg), last menstrual period 09/15/2016, SpO2 97 %. Body mass index is 49.78 kg/m.  General: Cooperative, alert, well developed, in no acute distress. HEENT: Conjunctivae and lids unremarkable. Cardiovascular: Regular rhythm.  Lungs: Normal work of breathing. Neurologic: No focal deficits.   Lab Results  Component Value Date   CREATININE 0.54 08/23/2022   BUN 18 08/23/2022   NA 138 08/23/2022   K 4.2 08/23/2022   CL 100 08/23/2022   CO2 32 08/23/2022   Lab Results  Component Value Date   ALT 23 08/23/2022   AST 19 08/23/2022   ALKPHOS 57 08/23/2022   BILITOT 0.4 08/23/2022   Lab Results  Component Value Date   HGBA1C 6.2 08/23/2022   HGBA1C 5.6 01/31/2022   HGBA1C 5.7 07/28/2021   HGBA1C 5.7 01/26/2021   HGBA1C 5.7 (H) 07/26/2020   Lab Results  Component Value Date   INSULIN 8.8 06/08/2020   INSULIN 13.7 02/25/2020   Lab Results  Component Value Date   TSH 2.65 01/31/2022   Lab Results  Component Value Date   CHOL 236 (H) 08/23/2022   HDL 61.80 08/23/2022   LDLCALC 158 (H) 08/23/2022   LDLDIRECT 144.2 07/20/2013   TRIG 81.0 08/23/2022  CHOLHDL 4 08/23/2022   Lab Results  Component Value Date   VD25OH 41.8 10/08/2022   VD25OH 46.02 01/31/2022   VD25OH 65.45 01/26/2021   Lab Results  Component Value Date   WBC 6.9 08/23/2022   HGB 13.3 08/23/2022   HCT 39.7 08/23/2022   MCV 96.6 08/23/2022   PLT 300.0 08/23/2022   Lab Results  Component Value Date   FERRITIN 32 03/26/2016    Attestation Statements:   Reviewed by clinician on  day of visit: allergies, medications, problem list, medical history, surgical history, family history, social history, and previous encounter notes.  Time spent on visit including pre-visit chart review and post-visit care and charting was 20 minutes.   I, Kathlene November, BS, CMA, am acting as transcriptionist for Thomes Dinning, MD.  I have reviewed the above documentation for accuracy and completeness, and I agree with the above. -Thomes Dinning, MD

## 2022-11-28 ENCOUNTER — Encounter (INDEPENDENT_AMBULATORY_CARE_PROVIDER_SITE_OTHER): Payer: Self-pay | Admitting: Family Medicine

## 2022-11-28 ENCOUNTER — Ambulatory Visit (INDEPENDENT_AMBULATORY_CARE_PROVIDER_SITE_OTHER): Payer: BC Managed Care – PPO | Admitting: Family Medicine

## 2022-11-28 VITALS — BP 128/78 | HR 56 | Temp 98.0°F | Ht 63.0 in | Wt 284.0 lb

## 2022-11-28 DIAGNOSIS — I152 Hypertension secondary to endocrine disorders: Secondary | ICD-10-CM

## 2022-11-28 DIAGNOSIS — E1165 Type 2 diabetes mellitus with hyperglycemia: Secondary | ICD-10-CM | POA: Diagnosis not present

## 2022-11-28 DIAGNOSIS — E669 Obesity, unspecified: Secondary | ICD-10-CM

## 2022-11-28 DIAGNOSIS — Z6841 Body Mass Index (BMI) 40.0 and over, adult: Secondary | ICD-10-CM

## 2022-11-28 DIAGNOSIS — E1159 Type 2 diabetes mellitus with other circulatory complications: Secondary | ICD-10-CM

## 2022-11-28 MED ORDER — TIRZEPATIDE 2.5 MG/0.5ML ~~LOC~~ SOAJ: 2.5000 mg | SUBCUTANEOUS | 0 refills | Status: DC

## 2022-11-29 ENCOUNTER — Ambulatory Visit (INDEPENDENT_AMBULATORY_CARE_PROVIDER_SITE_OTHER): Payer: BC Managed Care – PPO | Admitting: Internal Medicine

## 2022-12-12 NOTE — Progress Notes (Signed)
Chief Complaint:   OBESITY Rachel Norris is here to discuss her progress with her obesity treatment plan along with follow-up of her obesity related diagnoses. [name] is on the Category 3 Plan and states she is following her eating plan approximately 50% of the time. [name] states she is going gym 60-90 minutes 2-3 times per week.  Today's visit was #: 4 Starting weight: 284 lbs Starting date: 10/08/2022 Today's weight: 284 lbs Today's date: 11/28/2022 Total lbs lost to date: 0 lbs Total lbs lost since last in-office visit: 0  Interim History: [name] really voices struggles since July when her sister was diagnosed with cancer and died. She has been eating more PB recently. She mentions that the holiday was difficult and she emotionally eats. Realizes planning has been difficult.  Subjective:   1. Type 2 diabetes mellitus with hyperglycemia, without long-term current use of insulin ([place]) Emony stopped Rybelsus due to feeling of sour stomach on Metformin.  2. Hypertension associated with type 2 diabetes mellitus ([place]) Blood pressure controlled today. Denies chest pain, chest pressure and headache.  Assessment/Plan:   1. Type 2 diabetes mellitus with hyperglycemia, without long-term current use of insulin (HCC) Start Mounjaro 2.5 mg SubQ once a week for 1 month with 0 refills. Stop Rybelsus, continue Metformin.  -Start tirzepatide [facility]) 2.5 MG/0.5ML Pen; Inject 2.5 mg into the skin once a week.  Dispense: 2 mL; Refill: 0  2. Hypertension associated with type 2 diabetes mellitus (HCC) Continue current HCTZ and Cozaar.  3. Obesity with current BMI of 50.3 [name] is currently in the action stage of change. As such, her goal is to continue with weight loss efforts. She has agreed to the Category 3 Plan.   Exercise goals: All adults should avoid inactivity. Some physical activity is better than none, and adults who participate in any amount of physical activity gain some health  benefits.  Behavioral modification strategies: increasing lean protein intake, meal planning and cooking strategies, keeping healthy foods in the home, emotional eating strategies, holiday eating strategies , and planning for success.  [name] has agreed to follow-up with our clinic in 3 weeks. She was informed of the importance of frequent follow-up visits to maximize her success with intensive lifestyle modifications for her multiple health conditions.   Objective:   Blood pressure 128/78, pulse (!) 56, temperature 98 F (36.7 C), height '5\' 3"'$  (1.6 m), weight 284 lb (128.8 kg), last menstrual period 09/15/2016, SpO2 95 %. Body mass index is 50.31 kg/m.  General: Cooperative, alert, well developed, in no acute distress. HEENT: Conjunctivae and lids unremarkable. Cardiovascular: Regular rhythm.  Lungs: Normal work of breathing. Neurologic: No focal deficits.   Lab Results  Component Value Date   CREATININE 0.54 08/23/2022   BUN 18 08/23/2022   NA 138 08/23/2022   K 4.2 08/23/2022   CL 100 08/23/2022   CO2 32 08/23/2022   Lab Results  Component Value Date   ALT 23 08/23/2022   AST 19 08/23/2022   ALKPHOS 57 08/23/2022   BILITOT 0.4 08/23/2022   Lab Results  Component Value Date   HGBA1C 6.2 08/23/2022   HGBA1C 5.6 01/31/2022   HGBA1C 5.7 07/28/2021   HGBA1C 5.7 01/26/2021   HGBA1C 5.7 (H) 07/26/2020   Lab Results  Component Value Date   INSULIN 8.8 06/08/2020   INSULIN 13.7 02/25/2020   Lab Results  Component Value Date   TSH 2.65 01/31/2022   Lab Results  Component Value Date   CHOL  236 (H) 08/23/2022   HDL 61.80 08/23/2022   LDLCALC 158 (H) 08/23/2022   LDLDIRECT 144.2 07/20/2013   TRIG 81.0 08/23/2022   CHOLHDL 4 08/23/2022   Lab Results  Component Value Date   VD25OH 41.8 10/08/2022   VD25OH 46.02 01/31/2022   VD25OH 65.45 01/26/2021   Lab Results  Component Value Date   WBC 6.9 08/23/2022   HGB 13.3 08/23/2022   HCT 39.7 08/23/2022   MCV 96.6  08/23/2022   PLT 300.0 08/23/2022   Lab Results  Component Value Date   FERRITIN 32 03/26/2016   Attestation Statements:   Reviewed by clinician on day of visit: allergies, medications, problem list, medical history, surgical history, family history, social history, and previous encounter notes.  I, Elnora Morrison, RMA am acting as transcriptionist for Coralie Common, MD. I have reviewed the above documentation for accuracy and completeness, and I agree with the above. - Coralie Common, MD

## 2022-12-19 ENCOUNTER — Ambulatory Visit (INDEPENDENT_AMBULATORY_CARE_PROVIDER_SITE_OTHER): Payer: BC Managed Care – PPO | Admitting: Family Medicine

## 2022-12-20 ENCOUNTER — Other Ambulatory Visit: Payer: Self-pay | Admitting: Internal Medicine

## 2022-12-20 DIAGNOSIS — E1169 Type 2 diabetes mellitus with other specified complication: Secondary | ICD-10-CM

## 2023-01-07 ENCOUNTER — Ambulatory Visit (INDEPENDENT_AMBULATORY_CARE_PROVIDER_SITE_OTHER): Payer: BC Managed Care – PPO | Admitting: Internal Medicine

## 2023-01-07 ENCOUNTER — Encounter (INDEPENDENT_AMBULATORY_CARE_PROVIDER_SITE_OTHER): Payer: Self-pay | Admitting: Internal Medicine

## 2023-01-07 VITALS — BP 126/76 | HR 63 | Temp 97.6°F | Ht 63.0 in | Wt 287.0 lb

## 2023-01-07 DIAGNOSIS — Z6841 Body Mass Index (BMI) 40.0 and over, adult: Secondary | ICD-10-CM

## 2023-01-07 DIAGNOSIS — E669 Obesity, unspecified: Secondary | ICD-10-CM

## 2023-01-07 DIAGNOSIS — E118 Type 2 diabetes mellitus with unspecified complications: Secondary | ICD-10-CM

## 2023-01-07 DIAGNOSIS — Z7984 Long term (current) use of oral hypoglycemic drugs: Secondary | ICD-10-CM | POA: Diagnosis not present

## 2023-01-07 NOTE — Progress Notes (Signed)
Chief Complaint:   OBESITY Rachel Norris is here to discuss her progress with her obesity treatment plan along with follow-up of her obesity related diagnoses. Emmajo is on the Category 3 Plan and states she is following her eating plan approximately 85% of the time. Dorismar states she started strength training last week for 90 minutes 3 times per week.  Today's visit was #: 5 Starting weight: 284 lbs Starting date: 10/08/2022 Today's weight: 287 lbs Today's date: 01/07/2023 Total lbs lost to date: 0 Total lbs lost since last in-office visit: 0  Interim History: Rachel Norris presents today for follow-up.  Since last office visit she has gained 3 pounds.  She acknowledges deviating from prescribed nutritional plan over the holidays.  She also has discontinued Rybelsus because of upset stomach when taking medication.  This had been prescribed by PCP.  She has eliminated calorie dense foods from her home.  Has not been tracking her weight or calories.  She has increased water intake and denies consumption of liquid calories.  Subjective:   1. Type 2 diabetes mellitus with complication, without long-term current use of insulin (HCC) Most recent A1c was 6.2.  She is on Rybelsus and metformin but has discontinued Rybelsus over the last 2 weeks because of persistent upset stomach.    Assessment/Plan:   1. Type 2 diabetes mellitus with complication, without long-term current use of insulin ([place]) Continue with weight loss therapy, counseled on the benefits of a plant based diet. D/c rybelsus due to side effects. Continue metformin and reassess in 3 weeks.  Consider Ozempic as he may not have the same GI side effects.  2. Obesity with current BMI of 50.9 We discussed the benefits of a plant-based diet.  She will work on increasing vegetables and explore plant-based protein sources.  She will also begin to journal using application.  She was provided with instructions and I personally demonstrated tracking.    Rachel Norris is currently in the action stage of change. As such, her goal is to continue with weight loss efforts. She has agreed to the Category 3 Plan and keeping a food journal and adhering to recommended goals of 1500 calories and 100 grams of protein daily.   Exercise goals: As is.   Behavioral modification strategies: increasing lean protein intake, increasing vegetables, increasing water intake, meal planning and cooking strategies, avoiding temptations, planning for success, and keeping a strict food journal.  Rachel Norris has agreed to follow-up with our clinic in 3 weeks. She was informed of the importance of frequent follow-up visits to maximize her success with intensive lifestyle modifications for her multiple health conditions.   Objective:   Blood pressure 126/76, pulse 63, temperature 97.6 F (36.4 C), height '5\' 3"'$  (1.6 m), weight 287 lb (130.2 kg), last menstrual period 09/15/2016, SpO2 95 %. Body mass index is 50.84 kg/m.  General: Cooperative, alert, well developed, in no acute distress. HEENT: Conjunctivae and lids unremarkable. Cardiovascular: Regular rhythm.  Lungs: Normal work of breathing. Neurologic: No focal deficits.   Lab Results  Component Value Date   CREATININE 0.54 08/23/2022   BUN 18 08/23/2022   NA 138 08/23/2022   K 4.2 08/23/2022   CL 100 08/23/2022   CO2 32 08/23/2022   Lab Results  Component Value Date   ALT 23 08/23/2022   AST 19 08/23/2022   ALKPHOS 57 08/23/2022   BILITOT 0.4 08/23/2022   Lab Results  Component Value Date   HGBA1C 6.2 08/23/2022   HGBA1C 5.6 01/31/2022  HGBA1C 5.7 07/28/2021   HGBA1C 5.7 01/26/2021   HGBA1C 5.7 (H) 07/26/2020   Lab Results  Component Value Date   INSULIN 8.8 06/08/2020   INSULIN 13.7 02/25/2020   Lab Results  Component Value Date   TSH 2.65 01/31/2022   Lab Results  Component Value Date   CHOL 236 (H) 08/23/2022   HDL 61.80 08/23/2022   LDLCALC 158 (H) 08/23/2022   LDLDIRECT 144.2 07/20/2013    TRIG 81.0 08/23/2022   CHOLHDL 4 08/23/2022   Lab Results  Component Value Date   VD25OH 41.8 10/08/2022   VD25OH 46.02 01/31/2022   VD25OH 65.45 01/26/2021   Lab Results  Component Value Date   WBC 6.9 08/23/2022   HGB 13.3 08/23/2022   HCT 39.7 08/23/2022   MCV 96.6 08/23/2022   PLT 300.0 08/23/2022   Lab Results  Component Value Date   FERRITIN 32 03/26/2016   Attestation Statements:   Reviewed by clinician on day of visit: allergies, medications, problem list, medical history, surgical history, family history, social history, and previous encounter notes.   Wilhemena Durie, am acting as transcriptionist for Thomes Dinning, MD.  I have reviewed the above documentation for accuracy and completeness, and I agree with the above. -Thomes Dinning, MD

## 2023-01-28 ENCOUNTER — Ambulatory Visit (INDEPENDENT_AMBULATORY_CARE_PROVIDER_SITE_OTHER): Payer: BC Managed Care – PPO | Admitting: Internal Medicine

## 2023-01-28 ENCOUNTER — Encounter (INDEPENDENT_AMBULATORY_CARE_PROVIDER_SITE_OTHER): Payer: Self-pay | Admitting: Internal Medicine

## 2023-01-28 VITALS — BP 133/65 | HR 61 | Temp 98.0°F | Ht 63.0 in

## 2023-01-28 DIAGNOSIS — Z7984 Long term (current) use of oral hypoglycemic drugs: Secondary | ICD-10-CM

## 2023-01-28 DIAGNOSIS — Z6841 Body Mass Index (BMI) 40.0 and over, adult: Secondary | ICD-10-CM

## 2023-01-28 DIAGNOSIS — E1159 Type 2 diabetes mellitus with other circulatory complications: Secondary | ICD-10-CM | POA: Diagnosis not present

## 2023-01-28 DIAGNOSIS — E669 Obesity, unspecified: Secondary | ICD-10-CM

## 2023-01-28 DIAGNOSIS — I152 Hypertension secondary to endocrine disorders: Secondary | ICD-10-CM

## 2023-01-28 DIAGNOSIS — E1169 Type 2 diabetes mellitus with other specified complication: Secondary | ICD-10-CM

## 2023-01-28 MED ORDER — METFORMIN HCL 500 MG PO TABS: 500.0000 mg | ORAL_TABLET | Freq: Two times a day (BID) | ORAL | 1 refills | Status: DC

## 2023-01-28 NOTE — Assessment & Plan Note (Signed)
Blood pressure close to goal for age and risk category.  On losartan 100 mg, hydrochlorothiazide 25 mg without adverse effects.  Most recent renal parameters reviewed which showed normal electrolytes and kidney function.  Continue with weight loss therapy.  Monitor for symptoms of orthostasis while losing weight. Continue current regimen and home monitoring for a goal blood pressure of 120/80.

## 2023-01-28 NOTE — Assessment & Plan Note (Addendum)
HgbA1c is at goal for age and comorbid conditions. Denies symptoms of hypoglycemia or hyperglycemia. On metformin 500 mg once a day with good adherence and no side effects.   Counseled on goals of care, monitoring for complications and importance of staying updated on immunizations and diabetes preventive measures. Continue with reduced calorie meal plan low on processed crabs and simple sugars. Ongoing weight loss will improve insulin resistance and glycemic control.  I recommend increasing her metformin to 500 mg twice a day for incretin effect and assistance with weight loss.  Her LDL is above target she is not on statin therapy reasons are unknown.  This will be addressed further at her next office visit.  Lab Results  Component Value Date   HGBA1C 6.2 08/23/2022   HGBA1C 5.6 01/31/2022   HGBA1C 5.7 07/28/2021   Lab Results  Component Value Date   MICROALBUR 1.2 08/23/2022   LDLCALC 158 (H) 08/23/2022   CREATININE 0.54 08/23/2022

## 2023-01-28 NOTE — Progress Notes (Signed)
Office: [phone]  /  Fax: [phone]  WEIGHT SUMMARY AND BIOMETRICS  Medical Weight Loss Height: '5\' 3"'$  (1.6 m) Weight: 281 lb (127.5 kg) Temp: 98 F (36.7 C) Pulse Rate: 61 BP: 133/65 SpO2: 95 % Fasting: No Today's Visit #: 6 Weight at Last VIsit: 287 lb Weight Lost Since Last Visit: 6  Body Fat %: 57 % Fat Mass (lbs): 160.2 lbs Muscle Mass (lbs): 114.8 lbs Visceral Fat Rating : 22 Starting Date: 02/25/20 Starting Weight: 319 lb Total Weight Loss (lbs): 6 lb (2.722 kg)    HPI  Chief Complaint: OBESITY  Rachel Norris is here to discuss her progress with her obesity treatment plan. She is on the the Category 3 Plan and states she is following her eating plan approximately 95 % of the time. She states she is exercising 90 minutes 4 times per week. She has joined the gym and doing upper body and cardio at this time.    Interval History: Since last office she has lost 6 pounds.  Has been increasing intake of lean sources of protein and working on meal prep.  She reports adequate satiety, satiation and denies abnormal cravings.  She is no longer on GLP-1 therapy.  She is currently on metformin without any side effects.  Pharmacotherapy: Metformin, with primary indication for diabetes  PHYSICAL EXAM:  Blood pressure 133/65, pulse 61, temperature 98 F (36.7 C), height '5\' 3"'$  (1.6 m), last menstrual period 09/15/2016, SpO2 95 %. Body mass index is 50.84 kg/m.  General: She is overweight, cooperative, alert, well developed, and in no acute distress. PSYCH: Has normal mood, affect and thought process.   HEENT: EOMI, sclerae are anicteric. Lungs: Normal breathing effort, no conversational dyspnea. Extremities: No edema.  Neurologic: No gross sensory or motor deficits. No tremors or fasciculations noted.    DIAGNOSTIC DATA REVIEWED:  BMET    Component Value Date/Time   NA 138 08/23/2022 1058   NA 142 06/08/2020 1103   K 4.2 08/23/2022 1058   CL 100 08/23/2022 1058    CO2 32 08/23/2022 1058   GLUCOSE 106 (H) 08/23/2022 1058   BUN 18 08/23/2022 1058   BUN 16 06/08/2020 1103   CREATININE 0.54 08/23/2022 1058   CREATININE 0.53 07/26/2020 1047   CALCIUM 9.3 08/23/2022 1058   GFRNONAA 99 06/08/2020 1103   GFRAA 114 06/08/2020 1103   Lab Results  Component Value Date   HGBA1C 6.2 08/23/2022   HGBA1C 6.4 07/20/2013   Lab Results  Component Value Date   INSULIN 8.8 06/08/2020   INSULIN 13.7 02/25/2020   CBC    Component Value Date/Time   WBC 6.9 08/23/2022 1058   RBC 4.10 08/23/2022 1058   HGB 13.3 08/23/2022 1058   HGB 14.1 02/25/2020 1302   HCT 39.7 08/23/2022 1058   HCT 40.0 02/25/2020 1302   PLT 300.0 08/23/2022 1058   PLT 349 02/25/2020 1302   MCV 96.6 08/23/2022 1058   MCV 92 02/25/2020 1302   MCH 32.3 07/26/2020 1047   MCHC 33.6 08/23/2022 1058   RDW 12.8 08/23/2022 1058   RDW 12.7 02/25/2020 1302   Iron/TIBC/Ferritin/ %Sat    Component Value Date/Time   FERRITIN 32 03/26/2016 1320   Lipid Panel     Component Value Date/Time   CHOL 236 (H) 08/23/2022 1058   CHOL 217 (H) 06/08/2020 1103   TRIG 81.0 08/23/2022 1058   HDL 61.80 08/23/2022 1058   HDL 53 06/08/2020 1103   CHOLHDL 4 08/23/2022 1058  VLDL 16.2 08/23/2022 1058   LDLCALC 158 (H) 08/23/2022 1058   LDLCALC 156 (H) 07/26/2020 1047   LDLDIRECT 144.2 07/20/2013 1337   Hepatic Function Panel     Component Value Date/Time   PROT 7.1 08/23/2022 1058   PROT 7.0 06/08/2020 1103   ALBUMIN 4.2 08/23/2022 1058   ALBUMIN 4.3 06/08/2020 1103   AST 19 08/23/2022 1058   ALT 23 08/23/2022 1058   ALKPHOS 57 08/23/2022 1058   BILITOT 0.4 08/23/2022 1058   BILITOT 0.3 06/08/2020 1103      Component Value Date/Time   TSH 2.65 01/31/2022 1103     ASSESSMENT AND PLAN  TREATMENT PLAN FOR OBESITY:  Recommended Dietary Goals  Rachel Norris is currently in the action stage of change. As such, her goal is to continue weight management plan. She has agreed to the Category 3  Plan.  Behavioral Intervention  We discussed the following Behavioral Modification Strategies today: increasing lean protein intake, increasing vegetables, no skipping meals, and meal planning and cooking strategies.  Additional resources provided today: NA  Recommended Physical Activity Goals  Rachel Norris has been advised to work up to 150 minutes of moderate intensity aerobic activity a week and strengthening exercises 2-3 times per week for cardiovascular health, weight loss maintenance and preservation of muscle mass.   She has agreed to increase physical activity in their day and reduce sedentary time (increase NEAT).  and continue physical activity as is.    Pharmacotherapy We discussed various medication options to help Rachel Norris with her weight loss efforts and we both agreed to increase metformin to 500 mg twice a day for incretin effect.  She will review changes with her PCP  ASSOCIATED CONDITIONS ADDRESSED TODAY  Obesity with current BMI of 50.9  Type 2 diabetes mellitus with other specified complication, without long-term current use of insulin (HCC) Assessment & Plan: HgbA1c is at goal for age and comorbid conditions. Denies symptoms of hypoglycemia or hyperglycemia. On metformin 500 mg once a day with good adherence and no side effects.   Counseled on goals of care, monitoring for complications and importance of staying updated on immunizations and diabetes preventive measures. Continue with reduced calorie meal plan low on processed crabs and simple sugars. Ongoing weight loss will improve insulin resistance and glycemic control.  I recommend increasing her metformin to 500 mg twice a day for incretin effect and assistance with weight loss.  Lab Results  Component Value Date   HGBA1C 6.2 08/23/2022   HGBA1C 5.6 01/31/2022   HGBA1C 5.7 07/28/2021   Lab Results  Component Value Date   MICROALBUR 1.2 08/23/2022   LDLCALC 158 (H) 08/23/2022   CREATININE 0.54 08/23/2022       Orders: -     metFORMIN HCl; Take 1 tablet (500 mg total) by mouth 2 (two) times daily with a meal.  Dispense: 90 tablet; Refill: 1  Hypertension associated with type 2 diabetes mellitus ([place]) Assessment & Plan: Blood pressure close to goal for age and risk category.  On losartan 100 mg, hydrochlorothiazide 25 mg without adverse effects.  Most recent renal parameters reviewed which showed normal electrolytes and kidney function.  Continue with weight loss therapy.  Monitor for symptoms of orthostasis while losing weight. Continue current regimen and home monitoring for a goal blood pressure of 120/80.        Return in about 2 weeks (around 02/11/2023) for For Weight Mangement with Dr. Gerarda Fraction.Marland Kitchen She was informed of the importance of frequent follow up visits  to maximize her success with intensive lifestyle modifications for her multiple health conditions.   ATTESTASTION STATEMENTS:  Reviewed by clinician on day of visit: allergies, medications, problem list, medical history, surgical history, family history, social history, and previous encounter notes.   Time spent on visit including pre-visit chart review and post-visit care and charting was 30 minutes.    Thomes Dinning, MD

## 2023-01-31 ENCOUNTER — Encounter: Payer: Self-pay | Admitting: Internal Medicine

## 2023-01-31 NOTE — Progress Notes (Signed)
Subjective:    Patient ID: Rachel Norris, female    DOB: 06-20-1961, 62 y.o.   MRN: [id number]      HPI Latina is here for  Chief Complaint  Patient presents with   Otalgia    Started last Saturday thinks she may fluid behind her ears      B/l ear ache - symptoms started sat - one week ago.   She started with PND and developed congestion, bilateral ear pain, runny nose, sinus pressure, sneezing and minimal cough in the beginning that has improved.  She has felt hot and warm at times, but no known fever.  She denies any headaches, lightheadedness, shortness of breath and wheezing.  Tylenol, allergy medication   Covid test negative at home.     Medications and allergies reviewed with patient and updated if appropriate.  Current Outpatient Medications on File Prior to Visit  Medication Sig Dispense Refill   acetaminophen (TYLENOL) 650 MG CR tablet Take 1,300 mg by mouth every 8 (eight) hours as needed for pain.     albuterol (VENTOLIN HFA) 108 (90 Base) MCG/ACT inhaler Inhale 2 puffs into the lungs every 4 (four) hours as needed for wheezing or shortness of breath.     B Complex Vitamins (B COMPLEX 1 PO) Take by mouth.     Collagen-Vitamin C-Biotin (COLLAGEN 1500/C PO) Take by mouth.     fexofenadine (ALLEGRA) 180 MG tablet Take 180 mg by mouth daily as needed for allergies or rhinitis.     fluticasone (FLONASE) 50 MCG/ACT nasal spray Place 2 sprays into both nostrils daily as needed for allergies. 16 g 5   glucose blood (ACCU-CHEK GUIDE) test strip Use as instructed E11.9 100 strip 12   hydrochlorothiazide (HYDRODIURIL) 25 MG tablet TAKE 1 TABLET BY MOUTH EVERY DAY 90 tablet 2   ibuprofen (ADVIL) 200 MG tablet Take 200 mg by mouth every 6 (six) hours as needed.     Lancets (ACCU-CHEK SOFT TOUCH) lancets CHECK BLOOD SUGAR TWICE DAILY 180 each 1   losartan (COZAAR) 100 MG tablet TAKE 1 TABLET BY MOUTH EVERY DAY 90 tablet 2   metFORMIN (GLUCOPHAGE) 500 MG tablet Take 1  tablet (500 mg total) by mouth 2 (two) times daily with a meal. 90 tablet 1   omeprazole (PRILOSEC) 40 MG capsule Take 1 capsule (40 mg total) by mouth daily as needed (For heartburn or acid reflux.). 90 capsule 3   Specialty Vitamins Products (VITAMINS FOR HAIR) CAPS Take by mouth.     VITAMIN D-VITAMIN K PO Take by mouth.     No current facility-administered medications on file prior to visit.    Review of Systems  Constitutional:  Negative for fever.       Feeling hot/cold  HENT:  Positive for congestion, ear pain, postnasal drip, rhinorrhea, sinus pressure and sneezing. Negative for sinus pain.   Respiratory:  Positive for cough (intially - better now). Negative for shortness of breath and wheezing.   Neurological:  Negative for dizziness, light-headedness and headaches.       Objective:   Vitals:   02/01/23 1607  BP: 128/78  Pulse: 63  Temp: 98 F (36.7 C)  SpO2: 98%   BP Readings from Last 3 Encounters:  02/01/23 128/78  01/28/23 133/65  01/07/23 126/76   Wt Readings from Last 3 Encounters:  02/01/23 282 lb (127.9 kg)  01/07/23 287 lb (130.2 kg)  11/28/22 284 lb (128.8 kg)   Body mass index is  49.95 kg/m.    Physical Exam Constitutional:      General: She is not in acute distress.    Appearance: Normal appearance. She is not ill-appearing.  HENT:     Head: Normocephalic and atraumatic.     Right Ear: Tympanic membrane, ear canal and external ear normal.     Left Ear: Tympanic membrane, ear canal and external ear normal.     Mouth/Throat:     Mouth: Mucous membranes are moist.     Pharynx: No oropharyngeal exudate or posterior oropharyngeal erythema.  Eyes:     Conjunctiva/sclera: Conjunctivae normal.  Cardiovascular:     Rate and Rhythm: Normal rate and regular rhythm.  Pulmonary:     Effort: Pulmonary effort is normal. No respiratory distress.     Breath sounds: Normal breath sounds. No wheezing or rales.  Musculoskeletal:     Cervical back: Neck  supple. No tenderness.  Lymphadenopathy:     Cervical: No cervical adenopathy.  Skin:    General: Skin is warm and dry.  Neurological:     Mental Status: She is alert.            Assessment & Plan:    See Problem List for Assessment and Plan of chronic medical problems.

## 2023-02-01 ENCOUNTER — Ambulatory Visit: Payer: BC Managed Care – PPO | Admitting: Internal Medicine

## 2023-02-01 ENCOUNTER — Encounter: Payer: Self-pay | Admitting: Internal Medicine

## 2023-02-01 VITALS — BP 128/78 | HR 63 | Temp 98.0°F | Ht 63.0 in | Wt 282.0 lb

## 2023-02-01 DIAGNOSIS — J019 Acute sinusitis, unspecified: Secondary | ICD-10-CM | POA: Diagnosis not present

## 2023-02-01 DIAGNOSIS — I152 Hypertension secondary to endocrine disorders: Secondary | ICD-10-CM | POA: Diagnosis not present

## 2023-02-01 DIAGNOSIS — E1159 Type 2 diabetes mellitus with other circulatory complications: Secondary | ICD-10-CM | POA: Diagnosis not present

## 2023-02-01 DIAGNOSIS — H6993 Unspecified Eustachian tube disorder, bilateral: Secondary | ICD-10-CM | POA: Diagnosis not present

## 2023-02-01 MED ORDER — AMOXICILLIN-POT CLAVULANATE 875-125 MG PO TABS: 1.0000 | ORAL_TABLET | Freq: Two times a day (BID) | ORAL | 0 refills | Status: AC

## 2023-02-01 MED ORDER — PREDNISONE 20 MG PO TABS: 40.0000 mg | ORAL_TABLET | Freq: Every day | ORAL | 0 refills | Status: AC

## 2023-02-01 NOTE — Patient Instructions (Addendum)
Medications changes include :   Augmentin twice a day for one week.  Use the flonase daily.  Continue the allergy medication.   Use the prednisone if needed.     Return if symptoms worsen or fail to improve.   Eustachian Tube Dysfunction  Eustachian tube dysfunction refers to a condition in which a blockage develops in the narrow passage that connects the middle ear to the back of the nose (eustachian tube). The eustachian tube regulates air pressure in the middle ear by letting air move between the ear and nose. It also helps to drain fluid from the middle ear space. Eustachian tube dysfunction can affect one or both ears. When the eustachian tube does not function properly, air pressure, fluid, or both can build up in the middle ear. What are the causes? This condition occurs when the eustachian tube becomes blocked or cannot open normally. Common causes of this condition include: Ear infections. Colds and other infections that affect the nose, mouth, and throat (upper respiratory tract). Allergies. Irritation from cigarette smoke. Irritation from stomach acid coming up into the esophagus (gastroesophageal reflux). The esophagus is the part of the body that moves food from the mouth to the stomach. Sudden changes in air pressure, such as from descending in an airplane or scuba diving. Abnormal growths in the nose or throat, such as: Growths that line the nose (nasal polyps). Abnormal growth of cells (tumors). Enlarged tissue at the back of the throat (adenoids). What increases the risk? You are more likely to develop this condition if: You smoke. You are overweight. You are a child who has: Certain birth defects of the mouth, such as cleft palate. Large tonsils or adenoids. What are the signs or symptoms? Common symptoms of this condition include: A feeling of fullness in the ear. Ear pain. Clicking or popping noises in the ear. Ringing in the ear (tinnitus). Hearing  loss. Loss of balance. Dizziness. Symptoms may get worse when the air pressure around you changes, such as when you travel to an area of high elevation, fly on an airplane, or go scuba diving. How is this diagnosed? This condition may be diagnosed based on: Your symptoms. A physical exam of your ears, nose, and throat. Tests, such as those that measure: The movement of your eardrum. Your hearing (audiometry). How is this treated? Treatment depends on the cause and severity of your condition. In mild cases, you may relieve your symptoms by moving air into your ears. This is called "popping the ears." In more severe cases, or if you have symptoms of fluid in your ears, treatment may include: Medicines to relieve congestion (decongestants). Medicines that treat allergies (antihistamines). Nasal sprays or ear drops that contain medicines that reduce swelling (steroids). A procedure to drain the fluid in your eardrum. In this procedure, a small tube may be placed in the eardrum to: Drain the fluid. Restore the air in the middle ear space. A procedure to insert a balloon device through the nose to inflate the opening of the eustachian tube (balloon dilation). Follow these instructions at home: Lifestyle Do not do any of the following until your health care provider approves: Travel to high altitudes. Fly in airplanes. Work in a Pension scheme manager or room. Scuba dive. Do not use any products that contain nicotine or tobacco. These products include cigarettes, chewing tobacco, and vaping devices, such as e-cigarettes. If you need help quitting, ask your health care provider. Keep your ears dry. Wear fitted earplugs  during showering and bathing. Dry your ears completely after. General instructions Take over-the-counter and prescription medicines only as told by your health care provider. Use techniques to help pop your ears as recommended by your health care provider. These may  include: Chewing gum. Yawning. Frequent, forceful swallowing. Closing your mouth, holding your nose closed, and gently blowing as if you are trying to blow air out of your nose. Keep all follow-up visits. This is important. Contact a health care provider if: Your symptoms do not go away after treatment. Your symptoms come back after treatment. You are unable to pop your ears. You have: A fever. Pain in your ear. Pain in your head or neck. Fluid draining from your ear. Your hearing suddenly changes. You become very dizzy. You lose your balance. Get help right away if: You have a sudden, severe increase in any of your symptoms. Summary Eustachian tube dysfunction refers to a condition in which a blockage develops in the eustachian tube. It can be caused by ear infections, allergies, inhaled irritants, or abnormal growths in the nose or throat. Symptoms may include ear pain or fullness, hearing loss, or ringing in the ears. Mild cases are treated with techniques to unblock the ears, such as yawning or chewing gum. More severe cases are treated with medicines or procedures. This information is not intended to replace advice given to you by your health care provider. Make sure you discuss any questions you have with your health care provider. Document Revised: 02/27/2021 Document Reviewed: 02/27/2021 Elsevier Patient Education  [place].

## 2023-02-01 NOTE — Assessment & Plan Note (Signed)
Acute Likely bacterial  Start Augmentin 875-125 mg BID x 7 day otc cold medications, allergy medication, start Flonase daily Rest, fluid Call if no improvement

## 2023-02-01 NOTE — Assessment & Plan Note (Signed)
Chronic Blood pressure is well-controlled Continue hydrochlorothiazide 25 mg daily, losartan 100 mg daily

## 2023-02-01 NOTE — Assessment & Plan Note (Addendum)
Acute related to sinus infection Start Augmentin twice daily x 7 days Start using Flonase on a daily basis If no improvement she will take the prednisone 40 mg daily x 5 days-may even be able to take it only 2-3 days Call if no improvement

## 2023-02-18 ENCOUNTER — Encounter (INDEPENDENT_AMBULATORY_CARE_PROVIDER_SITE_OTHER): Payer: Self-pay | Admitting: Internal Medicine

## 2023-02-18 ENCOUNTER — Ambulatory Visit (INDEPENDENT_AMBULATORY_CARE_PROVIDER_SITE_OTHER): Payer: BC Managed Care – PPO | Admitting: Internal Medicine

## 2023-02-18 VITALS — BP 131/4 | HR 62 | Temp 97.7°F | Ht 63.0 in | Wt 275.0 lb

## 2023-02-18 DIAGNOSIS — E1169 Type 2 diabetes mellitus with other specified complication: Secondary | ICD-10-CM | POA: Diagnosis not present

## 2023-02-18 DIAGNOSIS — Z6841 Body Mass Index (BMI) 40.0 and over, adult: Secondary | ICD-10-CM

## 2023-02-18 DIAGNOSIS — E785 Hyperlipidemia, unspecified: Secondary | ICD-10-CM | POA: Diagnosis not present

## 2023-02-18 DIAGNOSIS — E669 Obesity, unspecified: Secondary | ICD-10-CM

## 2023-02-18 DIAGNOSIS — Z7984 Long term (current) use of oral hypoglycemic drugs: Secondary | ICD-10-CM

## 2023-02-18 NOTE — Assessment & Plan Note (Signed)
LDL is not at goal. Elevated LDL may be secondary to nutrition, genetics and spillover effect from excess adiposity. Recommended LDL goal is <70 to reduce the risk of fatty streaks and the progression to obstructive ASCVD in the future. Her 10 year risk is: The 10-year ASCVD risk score (Arnett DK, et al., 2019) is: 9.2%  Lab Results  Component Value Date   CHOL 236 (H) 08/23/2022   HDL 61.80 08/23/2022   LDLCALC 158 (H) 08/23/2022   LDLDIRECT 144.2 07/20/2013   TRIG 81.0 08/23/2022   CHOLHDL 4 08/23/2022    Continue weight loss therapy, losing 10 % of BW may improve condition. Also advised to reduce saturated fats in diet to less than 10% of daily calories.  She will be having repeat cholesterol at her next PCPs office.  We will assess cardiovascular risk at the time.  She still benefits from moderate statin therapy considering diabetes diagnosis.

## 2023-02-18 NOTE — Assessment & Plan Note (Signed)
HgbA1c is at goal for age and comorbid conditions. Denies symptoms of hypoglycemia or hyperglycemia. On metformin twice a day with good adherence and no side effects.   Counseled on goals of care, monitoring for complications and importance of staying updated on immunizations and diabetes preventive measures. Continue with reduced calorie meal plan low on processed crabs and simple sugars. Ongoing weight loss will improve insulin resistance and glycemic control  Lab Results  Component Value Date   HGBA1C 6.2 08/23/2022   HGBA1C 5.6 01/31/2022   HGBA1C 5.7 07/28/2021   Lab Results  Component Value Date   MICROALBUR 1.2 08/23/2022   LDLCALC 158 (H) 08/23/2022   CREATININE 0.54 08/23/2022

## 2023-02-18 NOTE — Progress Notes (Signed)
Office: [phone]  /  Fax: [phone]  WEIGHT SUMMARY AND BIOMETRICS  Medical Weight Loss Height: 5' 3"$  (1.6 m) Weight: 274 lb (124.3 kg) Temp: 97.7 F (36.5 C) Pulse Rate: 62 BP: (!) 131/4 SpO2: 96 % Fasting: No Labs: No Today's Visit #: 7 Weight at Last VIsit: 281 lb Weight Lost Since Last Visit: 7 l  Body Fat %: 55.3 % Fat Mass (lbs): 151.8 lbs Muscle Mass (lbs): 116.6 lbs Total Body Water (lbs): 90.4 lbs Visceral Fat Rating : 21 Peak Weight: 321 lb Starting Date: 02/25/20 Starting Weight: 319 lb Total Weight Loss (lbs): 13 lb (5.897 kg)    HPI  Chief Complaint: OBESITY  Rachel Norris is here to discuss her progress with her obesity treatment plan. She is on the the Category 3 Plan and states she is following her eating plan approximately 95 % of the time. She states she is exercising 90 minutes 4 times per week.   Interval History:  Since last office visit she has lost 7 pounds and reports good. adherence to prescribed reduced calorie nutrition plan. Has been working on journaling and tracking calories.  She is also eating out less has increased consumption of water.  She also notes increased levels of energy. Denies problems with appetite and hunger signals.  Denies problems with satiety and satiation.  Denies problems with eating patterns and portion control.  Sleeping approximately 5 hours a day.  Sleep described as non-restorative.  Barriers identified none.    Pharmacotherapy: None  PHYSICAL EXAM:  Blood pressure (!) 131/4, pulse 62, temperature 97.7 F (36.5 C), height 5' 3"$  (1.6 m), weight 275 lb (124.7 kg), last menstrual period 09/15/2016, SpO2 96 %. Body mass index is 48.71 kg/m.  General: She is overweight, cooperative, alert, well developed, and in no acute distress. PSYCH: Has normal mood, affect and thought process.   HEENT: EOMI, sclerae are anicteric. Lungs: Normal breathing effort, no conversational dyspnea. Extremities: No edema.   Neurologic: No gross sensory or motor deficits. No tremors or fasciculations noted.    ASSESSMENT AND PLAN  TREATMENT PLAN FOR OBESITY:  Recommended Dietary Goals  Deem is currently in the action stage of change. As such, her goal is to continue weight management plan. She has agreed to the Category 3 Plan.  Behavioral Intervention  We discussed the following Behavioral Modification Strategies today: increasing lean protein intake, increasing vegetables, increase water intake, think about ways to increase physical activity, and work on tracking and journaling calories using tracking App.  Additional resources provided today: Given handout on protein content and complex carbs  Recommended Physical Activity Goals  Emaya has been advised to work up to 150 minutes of moderate intensity aerobic activity a week and strengthening exercises 2-3 times per week for cardiovascular health, weight loss maintenance and preservation of muscle mass.   She has agreed to continue physical activity as is.    Pharmacotherapy We discussed various medication options to help Fancy with her weight loss efforts and we both agreed to continue with nutritional and behavioral strategies.  She is also on metformin which helps with weight loss maintenance and incretin effect.  ASSOCIATED CONDITIONS ADDRESSED TODAY  Type 2 diabetes mellitus with other specified complication, without long-term current use of insulin (HCC) Assessment & Plan: HgbA1c is at goal for age and comorbid conditions. Denies symptoms of hypoglycemia or hyperglycemia. On metformin twice a day with good adherence and no side effects.   Counseled on goals of care, monitoring for complications and  importance of staying updated on immunizations and diabetes preventive measures. Continue with reduced calorie meal plan low on processed crabs and simple sugars. Ongoing weight loss will improve insulin resistance and glycemic control  Lab Results   Component Value Date   HGBA1C 6.2 08/23/2022   HGBA1C 5.6 01/31/2022   HGBA1C 5.7 07/28/2021   Lab Results  Component Value Date   MICROALBUR 1.2 08/23/2022   LDLCALC 158 (H) 08/23/2022   CREATININE 0.54 08/23/2022       Obesity with current BMI of 48.71  Hyperlipidemia associated with type 2 diabetes mellitus ([place]) Assessment & Plan: LDL is not at goal. Elevated LDL may be secondary to nutrition, genetics and spillover effect from excess adiposity. Recommended LDL goal is <70 to reduce the risk of fatty streaks and the progression to obstructive ASCVD in the future. Her 10 year risk is: The 10-year ASCVD risk score (Arnett DK, et al., 2019) is: 9.2%  Lab Results  Component Value Date   CHOL 236 (H) 08/23/2022   HDL 61.80 08/23/2022   LDLCALC 158 (H) 08/23/2022   LDLDIRECT 144.2 07/20/2013   TRIG 81.0 08/23/2022   CHOLHDL 4 08/23/2022    Continue weight loss therapy, losing 10 % of BW may improve condition. Also advised to reduce saturated fats in diet to less than 10% of daily calories.  She will be having repeat cholesterol at her next PCPs office.  We will assess cardiovascular risk at the time.  She still benefits from moderate statin therapy considering diabetes diagnosis.          DIAGNOSTIC DATA REVIEWED:  BMET    Component Value Date/Time   NA 138 08/23/2022 1058   NA 142 06/08/2020 1103   K 4.2 08/23/2022 1058   CL 100 08/23/2022 1058   CO2 32 08/23/2022 1058   GLUCOSE 106 (H) 08/23/2022 1058   BUN 18 08/23/2022 1058   BUN 16 06/08/2020 1103   CREATININE 0.54 08/23/2022 1058   CREATININE 0.53 07/26/2020 1047   CALCIUM 9.3 08/23/2022 1058   GFRNONAA 99 06/08/2020 1103   GFRAA 114 06/08/2020 1103   Lab Results  Component Value Date   HGBA1C 6.2 08/23/2022   HGBA1C 6.4 07/20/2013   Lab Results  Component Value Date   INSULIN 8.8 06/08/2020   INSULIN 13.7 02/25/2020   Lab Results  Component Value Date   TSH 2.65 01/31/2022   CBC     Component Value Date/Time   WBC 6.9 08/23/2022 1058   RBC 4.10 08/23/2022 1058   HGB 13.3 08/23/2022 1058   HGB 14.1 02/25/2020 1302   HCT 39.7 08/23/2022 1058   HCT 40.0 02/25/2020 1302   PLT 300.0 08/23/2022 1058   PLT 349 02/25/2020 1302   MCV 96.6 08/23/2022 1058   MCV 92 02/25/2020 1302   MCH 32.3 07/26/2020 1047   MCHC 33.6 08/23/2022 1058   RDW 12.8 08/23/2022 1058   RDW 12.7 02/25/2020 1302   Iron Studies    Component Value Date/Time   FERRITIN 32 03/26/2016 1320   Lipid Panel     Component Value Date/Time   CHOL 236 (H) 08/23/2022 1058   CHOL 217 (H) 06/08/2020 1103   TRIG 81.0 08/23/2022 1058   HDL 61.80 08/23/2022 1058   HDL 53 06/08/2020 1103   CHOLHDL 4 08/23/2022 1058   VLDL 16.2 08/23/2022 1058   LDLCALC 158 (H) 08/23/2022 1058   LDLCALC 156 (H) 07/26/2020 1047   LDLDIRECT 144.2 07/20/2013 1337   Hepatic Function Panel  Component Value Date/Time   PROT 7.1 08/23/2022 1058   PROT 7.0 06/08/2020 1103   ALBUMIN 4.2 08/23/2022 1058   ALBUMIN 4.3 06/08/2020 1103   AST 19 08/23/2022 1058   ALT 23 08/23/2022 1058   ALKPHOS 57 08/23/2022 1058   BILITOT 0.4 08/23/2022 1058   BILITOT 0.3 06/08/2020 1103      Component Value Date/Time   TSH 2.65 01/31/2022 1103   Nutritional Lab Results  Component Value Date   VD25OH 41.8 10/08/2022   VD25OH 46.02 01/31/2022   VD25OH 65.45 01/26/2021      Return in about 3 weeks (around 03/11/2023) for For Weight Mangement with Dr. Gerarda Fraction.Marland Kitchen She was informed of the importance of frequent follow up visits to maximize her success with intensive lifestyle modifications for her multiple health conditions.    ATTESTASTION STATEMENTS:  Reviewed by clinician on day of visit: allergies, medications, problem list, medical history, surgical history, family history, social history, and previous encounter notes.   Time spent on visit including pre-visit chart review and post-visit care and charting was 30 minutes.     Thomes Dinning, MD

## 2023-02-21 ENCOUNTER — Other Ambulatory Visit: Payer: Self-pay | Admitting: Internal Medicine

## 2023-02-21 ENCOUNTER — Encounter: Payer: Self-pay | Admitting: Internal Medicine

## 2023-02-21 ENCOUNTER — Other Ambulatory Visit: Payer: Self-pay

## 2023-02-21 NOTE — Progress Notes (Signed)
Subjective:    Patient ID: Rachel Norris, female    DOB: 01-14-1961, 62 y.o.   MRN: [id number]     HPI Isaly is here for follow up of her chronic medical problems, including DM, htn, hld, GERD   Intermittent itching in ears.  Has allergies, sinus issues.  All issues are chronic.  Sinus infection resvoled with abx, steroids.  Will go see ENT.   She is exercising.    She is doing well with her diet.    The 10-year ASCVD risk score (Arnett DK, et al., 2019) is: 8.8%   Values used to calculate the score:     Age: 21 years     Sex: Female     Is Non-Hispanic African American: No     Diabetic: Yes     Tobacco smoker: No     Systolic Blood Pressure: 0000000 mmHg     Is BP treated: Yes     HDL Cholesterol: 61.8 mg/dL     Total Cholesterol: 236 mg/dL   Medications and allergies reviewed with patient and updated if appropriate.  Current Outpatient Medications on File Prior to Visit  Medication Sig Dispense Refill   acetaminophen (TYLENOL) 650 MG CR tablet Take 1,300 mg by mouth every 8 (eight) hours as needed for pain.     albuterol (VENTOLIN HFA) 108 (90 Base) MCG/ACT inhaler Inhale 2 puffs into the lungs every 4 (four) hours as needed for wheezing or shortness of breath.     B Complex Vitamins (B COMPLEX 1 PO) Take by mouth.     Collagen-Vitamin C-Biotin (COLLAGEN 1500/C PO) Take by mouth.     fexofenadine (ALLEGRA) 180 MG tablet Take 180 mg by mouth daily as needed for allergies or rhinitis.     fluticasone (FLONASE) 50 MCG/ACT nasal spray Place 2 sprays into both nostrils daily as needed for allergies. 16 g 5   glucose blood (ACCU-CHEK GUIDE) test strip Use as instructed E11.9 100 strip 12   hydrochlorothiazide (HYDRODIURIL) 25 MG tablet TAKE 1 TABLET BY MOUTH EVERY DAY 90 tablet 2   ibuprofen (ADVIL) 200 MG tablet Take 200 mg by mouth every 6 (six) hours as needed.     Lancets (ACCU-CHEK SOFT TOUCH) lancets CHECK BLOOD SUGAR TWICE DAILY 180 each 1   losartan (COZAAR)  100 MG tablet TAKE 1 TABLET BY MOUTH EVERY DAY 90 tablet 2   metFORMIN (GLUCOPHAGE) 500 MG tablet Take 1 tablet (500 mg total) by mouth 2 (two) times daily with a meal. 90 tablet 1   omeprazole (PRILOSEC) 40 MG capsule TAKE 1 CAPSULE (40 MG TOTAL) BY MOUTH DAILY AS NEEDED (FOR HEARTBURN OR ACID REFLUX.). 90 capsule 3   Specialty Vitamins Products (VITAMINS FOR HAIR) CAPS Take by mouth.     VITAMIN D-VITAMIN K PO Take by mouth.     No current facility-administered medications on file prior to visit.     Review of Systems  Constitutional:  Negative for fever.  Respiratory:  Negative for cough, shortness of breath and wheezing.   Cardiovascular:  Positive for palpitations (occ). Negative for chest pain and leg swelling.  Neurological:  Negative for light-headedness and headaches.       Objective:   Vitals:   02/22/23 0920  BP: 128/80  Pulse: (!) 55  Temp: 98.1 F (36.7 C)  SpO2: 98%   BP Readings from Last 3 Encounters:  02/22/23 128/80  02/18/23 (!) 131/4  02/01/23 128/78   Wt Readings from  Last 3 Encounters:  02/22/23 280 lb (127 kg)  02/18/23 275 lb (124.7 kg)  02/01/23 282 lb (127.9 kg)   Body mass index is 49.6 kg/m.    Physical Exam Constitutional:      General: She is not in acute distress.    Appearance: Normal appearance.  HENT:     Head: Normocephalic and atraumatic.  Eyes:     Conjunctiva/sclera: Conjunctivae normal.  Cardiovascular:     Rate and Rhythm: Normal rate and regular rhythm.     Heart sounds: Normal heart sounds.  Pulmonary:     Effort: Pulmonary effort is normal. No respiratory distress.     Breath sounds: Normal breath sounds. No wheezing.  Musculoskeletal:     Cervical back: Neck supple.     Right lower leg: No edema.     Left lower leg: No edema.  Lymphadenopathy:     Cervical: No cervical adenopathy.  Skin:    General: Skin is warm and dry.     Findings: No rash.  Neurological:     Mental Status: She is alert. Mental status is  at baseline.  Psychiatric:        Mood and Affect: Mood normal.        Behavior: Behavior normal.        Lab Results  Component Value Date   WBC 6.9 08/23/2022   HGB 13.3 08/23/2022   HCT 39.7 08/23/2022   PLT 300.0 08/23/2022   GLUCOSE 106 (H) 08/23/2022   CHOL 236 (H) 08/23/2022   TRIG 81.0 08/23/2022   HDL 61.80 08/23/2022   LDLDIRECT 144.2 07/20/2013   LDLCALC 158 (H) 08/23/2022   ALT 23 08/23/2022   AST 19 08/23/2022   NA 138 08/23/2022   K 4.2 08/23/2022   CL 100 08/23/2022   CREATININE 0.54 08/23/2022   BUN 18 08/23/2022   CO2 32 08/23/2022   TSH 2.65 01/31/2022   INR 0.9 07/09/2008   HGBA1C 6.2 08/23/2022   MICROALBUR 1.2 08/23/2022     Assessment & Plan:    See Problem List for Assessment and Plan of chronic medical problems.

## 2023-02-21 NOTE — Patient Instructions (Addendum)
      Blood work was ordered.   The lab is on the first floor.    Medications changes include :   none     Return in about 6 months (around 08/23/2023) for Physical Exam.

## 2023-02-22 ENCOUNTER — Encounter: Payer: Self-pay | Admitting: Internal Medicine

## 2023-02-22 ENCOUNTER — Ambulatory Visit: Payer: BC Managed Care – PPO | Admitting: Internal Medicine

## 2023-02-22 VITALS — BP 128/80 | HR 55 | Temp 98.1°F | Ht 63.0 in | Wt 280.0 lb

## 2023-02-22 DIAGNOSIS — E1169 Type 2 diabetes mellitus with other specified complication: Secondary | ICD-10-CM

## 2023-02-22 DIAGNOSIS — E785 Hyperlipidemia, unspecified: Secondary | ICD-10-CM

## 2023-02-22 DIAGNOSIS — I152 Hypertension secondary to endocrine disorders: Secondary | ICD-10-CM

## 2023-02-22 DIAGNOSIS — E1159 Type 2 diabetes mellitus with other circulatory complications: Secondary | ICD-10-CM | POA: Diagnosis not present

## 2023-02-22 DIAGNOSIS — K219 Gastro-esophageal reflux disease without esophagitis: Secondary | ICD-10-CM | POA: Diagnosis not present

## 2023-02-22 LAB — LIPID PANEL
Cholesterol: 233 mg/dL — ABNORMAL HIGH (ref 0–200)
HDL: 54.2 mg/dL (ref >39.00)
LDL Cholesterol: 158 mg/dL — ABNORMAL HIGH (ref 0–99)
NonHDL: 178.61
Total CHOL/HDL Ratio: 4
Triglycerides: 101 mg/dL (ref 0.0–149.0)
VLDL: 20.2 mg/dL (ref 0.0–40.0)

## 2023-02-22 LAB — COMPREHENSIVE METABOLIC PANEL
ALT: 17 U/L (ref 0–35)
AST: 15 U/L (ref 0–37)
Albumin: 4.3 g/dL (ref 3.5–5.2)
Alkaline Phosphatase: 51 U/L (ref 39–117)
BUN: 17 mg/dL (ref 6–23)
CO2: 30 mEq/L (ref 19–32)
Calcium: 9.8 mg/dL (ref 8.4–10.5)
Chloride: 101 mEq/L (ref 96–112)
Creatinine, Ser: 0.59 mg/dL (ref 0.40–1.20)
GFR: 98.14 mL/min (ref >60.00)
Glucose, Bld: 102 mg/dL — ABNORMAL HIGH (ref 70–99)
Potassium: 3.8 mEq/L (ref 3.5–5.1)
Sodium: 139 mEq/L (ref 135–145)
Total Bilirubin: 0.4 mg/dL (ref 0.2–1.2)
Total Protein: 7 g/dL (ref 6.0–8.3)

## 2023-02-22 LAB — HEMOGLOBIN A1C: Hgb A1c MFr Bld: 6.2 % (ref 4.6–6.5)

## 2023-02-22 NOTE — Assessment & Plan Note (Signed)
Chronic GERD controlled Continue omeprazole 40 mg daily as needed

## 2023-02-22 NOTE — Assessment & Plan Note (Addendum)
Chronic Regular exercise and healthy diet encouraged Check lipid panel ASCVD risk improved with BP numbers today Advised recommendation to start statin Continue lifestyle-deferred statin for now - will re-evaluate after labs

## 2023-02-22 NOTE — Assessment & Plan Note (Signed)
Chronic BP well controlled Continue hctz 25 mg daily, losartan 100 mg daily cmp

## 2023-02-22 NOTE — Assessment & Plan Note (Addendum)
Chronic   Lab Results  Component Value Date   HGBA1C 6.2 08/23/2022   Sugars well controlled Check A1c Continue metformin 500 mg  - currently taking once a day --- advised to try twice daily Stressed regular exercise, diabetic diet Working on weight loss

## 2023-02-27 ENCOUNTER — Other Ambulatory Visit (HOSPITAL_BASED_OUTPATIENT_CLINIC_OR_DEPARTMENT_OTHER): Payer: Self-pay | Admitting: Internal Medicine

## 2023-02-27 DIAGNOSIS — Z1231 Encounter for screening mammogram for malignant neoplasm of breast: Secondary | ICD-10-CM

## 2023-03-04 ENCOUNTER — Ambulatory Visit (HOSPITAL_BASED_OUTPATIENT_CLINIC_OR_DEPARTMENT_OTHER)
Admission: RE | Admit: 2023-03-04 | Discharge: 2023-03-04 | Disposition: A | Discharge status: Home / Self Care | Payer: BC Managed Care – PPO | Source: Ambulatory Visit | Attending: Internal Medicine | Admitting: Internal Medicine

## 2023-03-04 ENCOUNTER — Encounter (HOSPITAL_BASED_OUTPATIENT_CLINIC_OR_DEPARTMENT_OTHER): Payer: Self-pay

## 2023-03-04 DIAGNOSIS — Z1231 Encounter for screening mammogram for malignant neoplasm of breast: Secondary | ICD-10-CM | POA: Diagnosis present

## 2023-03-11 ENCOUNTER — Ambulatory Visit (INDEPENDENT_AMBULATORY_CARE_PROVIDER_SITE_OTHER): Payer: BC Managed Care – PPO | Admitting: Internal Medicine

## 2023-03-28 ENCOUNTER — Ambulatory Visit (INDEPENDENT_AMBULATORY_CARE_PROVIDER_SITE_OTHER): Payer: BC Managed Care – PPO | Admitting: Internal Medicine

## 2023-06-15 ENCOUNTER — Other Ambulatory Visit: Payer: Self-pay | Admitting: Internal Medicine

## 2023-06-15 DIAGNOSIS — E1169 Type 2 diabetes mellitus with other specified complication: Secondary | ICD-10-CM

## 2023-09-15 ENCOUNTER — Other Ambulatory Visit: Payer: Self-pay | Admitting: Internal Medicine

## 2023-09-15 DIAGNOSIS — I152 Hypertension secondary to endocrine disorders: Secondary | ICD-10-CM

## 2023-10-03 ENCOUNTER — Other Ambulatory Visit: Payer: Self-pay | Admitting: Internal Medicine

## 2023-10-03 DIAGNOSIS — E1169 Type 2 diabetes mellitus with other specified complication: Secondary | ICD-10-CM

## 2023-10-08 NOTE — Progress Notes (Unsigned)
Rachel Norris [birth year] [id number]   History:  62 y.o. G0 presents for annual exam. Postmenopausal - no HRT, no bleeding. 1991 laser surgery on cervix. 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 H/O T2DM. Working on weight loss. Lost 80 pounds with diet a few years back. Has started to increase her exercise, has membership at [employer]. Considering seeing [facility] again.   Gynecologic History Patient's last menstrual period was 09/15/2016.   Contraception/Family planning: post menopausal status Sexually active: No  Health Maintenance Last Pap: 10/04/2022. Results were: Normal neg HPV, 3-year recall Last mammogram: 03/04/2023. Results were: Normal Last colonoscopy: 05/15/2016. Results were: Normal, 10-year recall Last Dexa: Never   Past medical history, past surgical history, family history and social history were all reviewed and documented in the EPIC chart. Married. Retired. Used to work in [place].   ROS:  A ROS was performed and pertinent positives and negatives are included.  Exam:  Vitals:   10/09/23 0939  BP: 112/80  Pulse: (!) 59  SpO2: 97%  Weight: (!) 302 lb (137 kg)  Height: 5\' 2"  (1.575 m)   Body mass index is 55.24 kg/m.   General appearance:  Normal Thyroid:  Symmetrical, normal in size, without palpable masses or nodularity. Respiratory  Auscultation:  Clear without wheezing or rhonchi Cardiovascular  Auscultation:  Regular rate, without rubs, murmurs or gallops  Edema/varicosities:  Not grossly evident Abdominal  Soft,nontender, without masses, guarding or rebound.  Liver/spleen:  No organomegaly noted  Hernia:  None appreciated  Skin  Inspection:  Grossly normal Breasts: Examined lying and sitting.   Right: Without masses, retractions, nipple discharge or axillary adenopathy.   Left: Without masses, retractions, nipple discharge or axillary adenopathy. Pelvic: External genitalia:  no lesions              Urethra:  normal appearing  urethra with no masses, tenderness or lesions              Bartholins and Skenes: normal                 Vagina: normal appearing vagina with normal color and discharge, no lesions              Cervix: no lesions Bimanual Exam:  Uterus: Difficult to palpate due to body habitus but no gross masses or tenderness              Adnexa: no mass, fullness, tenderness              Rectovaginal: Deferred              Anus:  normal, no lesions  Patient informed chaperone available to be present for breast and pelvic exam. Patient has requested no chaperone to be present. Patient has been advised what will be completed during breast and pelvic exam.   Assessment/Plan:  62 y.o. G0 for annual exam.   Well female exam with routine gynecological exam - Education provided on SBEs, importance of preventative screenings, current guidelines, high calcium diet, regular exercise, and multivitamin daily. Labs with PCP.   Postmenopausal - No HRT, no bleeding.   Screening for cervical cancer - 1991 laser surgery on cervix. 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 Will repeat at 3-year interval per guidelines.  Screening for breast cancer - Normal mammogram history.  Continue annual screenings.  Normal breast exam today.  Screening for colon cancer - 2017 colonoscopy. Will repeat at 10-year interval per GI's recommendation.   Screening  for osteoporosis - Average risk. Will plan DXA at age 56.   Return in about 1 year (around 10/08/2024) for Annual.     Olivia Mackie DNP, 10:11 AM 10/09/2023

## 2023-10-09 ENCOUNTER — Encounter: Payer: Self-pay | Admitting: Nurse Practitioner

## 2023-10-09 ENCOUNTER — Ambulatory Visit (INDEPENDENT_AMBULATORY_CARE_PROVIDER_SITE_OTHER): Payer: BC Managed Care – PPO | Admitting: Nurse Practitioner

## 2023-10-09 VITALS — BP 112/80 | HR 59 | Ht 62.0 in | Wt 302.0 lb

## 2023-10-09 DIAGNOSIS — Z780 Asymptomatic menopausal state: Secondary | ICD-10-CM

## 2023-10-09 DIAGNOSIS — Z01419 Encounter for gynecological examination (general) (routine) without abnormal findings: Secondary | ICD-10-CM | POA: Diagnosis not present

## 2023-11-13 NOTE — Patient Instructions (Addendum)
Blood work was ordered.   The lab is on the first floor.    Medications changes include :   none     Return in about 6 months (around 05/13/2024) for follow up.    Health Maintenance, Female Adopting a healthy lifestyle and getting preventive care are important in promoting health and wellness. Ask your health care provider about: The right schedule for you to have regular tests and exams. Things you can do on your own to prevent diseases and keep yourself healthy. What should I know about diet, weight, and exercise? Eat a healthy diet  Eat a diet that includes plenty of vegetables, fruits, low-fat dairy products, and lean protein. Do not eat a lot of foods that are high in solid fats, added sugars, or sodium. Maintain a healthy weight Body mass index (BMI) is used to identify weight problems. It estimates body fat based on height and weight. Your health care provider can help determine your BMI and help you achieve or maintain a healthy weight. Get regular exercise Get regular exercise. This is one of the most important things you can do for your health. Most adults should: Exercise for at least 150 minutes each week. The exercise should increase your heart rate and make you sweat (moderate-intensity exercise). Do strengthening exercises at least twice a week. This is in addition to the moderate-intensity exercise. Spend less time sitting. Even light physical activity can be beneficial. Watch cholesterol and blood lipids Have your blood tested for lipids and cholesterol at 61 years of age, then have this test every 5 years. Have your cholesterol levels checked more often if: Your lipid or cholesterol levels are high. You are older than 61 years of age. You are at high risk for heart disease. What should I know about cancer screening? Depending on your health history and family history, you may need to have cancer screening at various ages. This may include screening  for: Breast cancer. Cervical cancer. Colorectal cancer. Skin cancer. Lung cancer. What should I know about heart disease, diabetes, and high blood pressure? Blood pressure and heart disease High blood pressure causes heart disease and increases the risk of stroke. This is more likely to develop in people who have high blood pressure readings or are overweight. Have your blood pressure checked: Every 3-5 years if you are 13-8 years of age. Every year if you are 80 years old or older. Diabetes Have regular diabetes screenings. This checks your fasting blood sugar level. Have the screening done: Once every three years after age 69 if you are at a normal weight and have a low risk for diabetes. More often and at a younger age if you are overweight or have a high risk for diabetes. What should I know about preventing infection? Hepatitis B If you have a higher risk for hepatitis B, you should be screened for this virus. Talk with your health care provider to find out if you are at risk for hepatitis B infection. Hepatitis C Testing is recommended for: Everyone born from [birth year] through 1964. Anyone with known risk factors for hepatitis C. Sexually transmitted infections (STIs) Get screened for STIs, including gonorrhea and chlamydia, if: You are sexually active and are younger than 61 years of age. You are older than 61 years of age and your health care provider tells you that you are at risk for this type of infection. Your sexual activity has changed since you were last screened, and you are  at increased risk for chlamydia or gonorrhea. Ask your health care provider if you are at risk. Ask your health care provider about whether you are at high risk for HIV. Your health care provider may recommend a prescription medicine to help prevent HIV infection. If you choose to take medicine to prevent HIV, you should first get tested for HIV. You should then be tested every 3 months for as long as you  are taking the medicine. Pregnancy If you are about to stop having your period (premenopausal) and you may become pregnant, seek counseling before you get pregnant. Take 400 to 800 micrograms (mcg) of folic acid every day if you become pregnant. Ask for birth control (contraception) if you want to prevent pregnancy. Osteoporosis and menopause Osteoporosis is a disease in which the bones lose minerals and strength with aging. This can result in bone fractures. If you are 40 years old or older, or if you are at risk for osteoporosis and fractures, ask your health care provider if you should: Be screened for bone loss. Take a calcium or vitamin D supplement to lower your risk of fractures. Be given hormone replacement therapy (HRT) to treat symptoms of menopause. Follow these instructions at home: Alcohol use Do not drink alcohol if: Your health care provider tells you not to drink. You are pregnant, may be pregnant, or are planning to become pregnant. If you drink alcohol: Limit how much you have to: 0-1 drink a day. Know how much alcohol is in your drink. In the [place] one drink equals one 12 oz bottle of beer (355 mL), one 5 oz glass of wine (148 mL), or one 1 oz glass of hard liquor (44 mL). Lifestyle Do not use any products that contain nicotine or tobacco. These products include cigarettes, chewing tobacco, and vaping devices, such as e-cigarettes. If you need help quitting, ask your health care provider. Do not use street drugs. Do not share needles. Ask your health care provider for help if you need support or information about quitting drugs. General instructions Schedule regular health, dental, and eye exams. Stay current with your vaccines. Tell your health care provider if: You often feel depressed. You have ever been abused or do not feel safe at home. Summary Adopting a healthy lifestyle and getting preventive care are important in promoting health and wellness. Follow your  health care provider's instructions about healthy diet, exercising, and getting tested or screened for diseases. Follow your health care provider's instructions on monitoring your cholesterol and blood pressure. This information is not intended to replace advice given to you by your health care provider. Make sure you discuss any questions you have with your health care provider. Document Revised: 05/08/2021 Document Reviewed: 05/08/2021 Elsevier Patient Education  2024 [employer].

## 2023-11-13 NOTE — Progress Notes (Unsigned)
Subjective:    Patient ID: Rachel Norris, female    DOB: March 19, 1961, 62 y.o.   MRN: [id number]      HPI Rachel Norris is here for a Physical exam and her chronic medical problems.   Sugars have been a little higher.  She is eating better again and does feel better when she eats better.  Had sciatica and right knee pain for a while and was not able to exercise.  Has gotten back to the gym.     Medications and allergies reviewed with patient and updated if appropriate.  Current Outpatient Medications on File Prior to Visit  Medication Sig Dispense Refill   ACCU-CHEK GUIDE test strip USE AS INSTRUCTED E11.9 100 strip 12   acetaminophen (TYLENOL) 650 MG CR tablet Take 1,300 mg by mouth every 8 (eight) hours as needed for pain.     albuterol (VENTOLIN HFA) 108 (90 Base) MCG/ACT inhaler Inhale 2 puffs into the lungs every 4 (four) hours as needed for wheezing or shortness of breath.     ascorbic acid (VITAMIN C) 1000 MG tablet Take by mouth.     B Complex Vitamins (B COMPLEX 1 PO) Take by mouth.     cetirizine (ZYRTEC) 10 MG tablet Take by mouth.     Collagen-Vitamin C-Biotin (COLLAGEN 1500/C PO) Take by mouth.     fexofenadine (ALLEGRA) 180 MG tablet Take 180 mg by mouth daily as needed for allergies or rhinitis.     fluticasone (FLONASE) 50 MCG/ACT nasal spray Place 2 sprays into both nostrils daily as needed for allergies. 16 g 5   hydrochlorothiazide (HYDRODIURIL) 25 MG tablet TAKE 1 TABLET BY MOUTH EVERY DAY 90 tablet 0   Lancets (ACCU-CHEK SOFT TOUCH) lancets CHECK BLOOD SUGAR TWICE DAILY 180 each 1   losartan (COZAAR) 100 MG tablet TAKE 1 TABLET BY MOUTH EVERY DAY 90 tablet 0   MAGNESIUM PO Take by mouth.     metFORMIN (GLUCOPHAGE) 500 MG tablet TAKE 1 TABLET BY MOUTH EVERY DAY WITH BREAKFAST 90 tablet 0   omeprazole (PRILOSEC) 40 MG capsule TAKE 1 CAPSULE (40 MG TOTAL) BY MOUTH DAILY AS NEEDED (FOR HEARTBURN OR ACID REFLUX.). 90 capsule 3   Specialty Vitamins Products  (VITAMINS FOR HAIR) CAPS Take by mouth.     VITAMIN D-VITAMIN K PO Take by mouth.     No current facility-administered medications on file prior to visit.    Review of Systems  Constitutional:  Negative for fever.  Eyes:  Negative for visual disturbance.  Respiratory:  Negative for cough (allergy related), shortness of breath and wheezing.   Cardiovascular:  Positive for leg swelling (occ). Negative for chest pain and palpitations (occ w/ too much coffee).  Gastrointestinal:  Positive for anal bleeding (anal fissure). Negative for abdominal pain, blood in stool, constipation and diarrhea.       Occ gerd  Genitourinary:  Negative for dysuria.  Musculoskeletal:  Positive for arthralgias and back pain (occ).  Skin:  Negative for rash.  Neurological:  Negative for light-headedness and headaches (sinus).  Psychiatric/Behavioral:  Positive for dysphoric mood (little down at times). The patient is not nervous/anxious.        Objective:   Vitals:   11/14/23 0932  BP: 118/74  Pulse: 68  Temp: 98.4 F (36.9 C)  SpO2: 97%   Filed Weights   11/14/23 0932  Weight: (!) 304 lb (137.9 kg)   Body mass index is 55.6 kg/m.  BP Readings from Last 3  Encounters:  11/14/23 118/74  10/09/23 112/80  02/22/23 128/80    Wt Readings from Last 3 Encounters:  11/14/23 (!) 304 lb (137.9 kg)  10/09/23 (!) 302 lb (137 kg)  02/22/23 280 lb (127 kg)       Physical Exam Constitutional: She appears well-developed and well-nourished. No distress.  HENT:  Head: Normocephalic and atraumatic.  Right Ear: External ear normal. Normal ear canal and TM Left Ear: External ear normal.  Normal ear canal and TM Mouth/Throat: Oropharynx is clear and moist.  Eyes: Conjunctivae normal.  Neck: Neck supple. No tracheal deviation present. No thyromegaly present.  No carotid bruit  Cardiovascular: Normal rate, regular rhythm and normal heart sounds.   No murmur heard.  No edema. Pulmonary/Chest: Effort  normal and breath sounds normal. No respiratory distress. She has no wheezes. She has no rales.  Breast: deferred   Abdominal: Soft. She exhibits no distension. There is no tenderness.  Lymphadenopathy: She has no cervical adenopathy.  Skin: Skin is warm and dry. She is not diaphoretic.  Psychiatric: She has a normal mood and affect. Her behavior is normal.   Diabetic Foot Exam - Simple   Simple Foot Form Diabetic Foot exam was performed with the following findings: Yes 11/14/2023 10:30 AM  Visual Inspection No deformities, no ulcerations, no other skin breakdown bilaterally: Yes Sensation Testing Intact to touch and monofilament testing bilaterally: Yes Pulse Check Posterior Tibialis and Dorsalis pulse intact bilaterally: Yes Comments      Lab Results  Component Value Date   WBC 6.9 08/23/2022   HGB 13.3 08/23/2022   HCT 39.7 08/23/2022   PLT 300.0 08/23/2022   GLUCOSE 102 (H) 02/22/2023   CHOL 233 (H) 02/22/2023   TRIG 101.0 02/22/2023   HDL 54.20 02/22/2023   LDLDIRECT 144.2 07/20/2013   LDLCALC 158 (H) 02/22/2023   ALT 17 02/22/2023   AST 15 02/22/2023   NA 139 02/22/2023   K 3.8 02/22/2023   CL 101 02/22/2023   CREATININE 0.59 02/22/2023   BUN 17 02/22/2023   CO2 30 02/22/2023   TSH 2.65 01/31/2022   INR 0.9 07/09/2008   HGBA1C 6.2 02/22/2023   MICROALBUR 1.2 08/23/2022    The 10-year ASCVD risk score (Arnett DK, et al., 2019) is: 8.1%   Values used to calculate the score:     Age: 36 years     Sex: Female     Is Non-Hispanic African American: No     Diabetic: Yes     Tobacco smoker: No     Systolic Blood Pressure: 118 mmHg     Is BP treated: Yes     HDL Cholesterol: 54.2 mg/dL     Total Cholesterol: 233 mg/dL      Assessment & Plan:   Physical exam: Screening blood work  ordered Exercise regular now - was not able to exercise for a while due to back pain and knee pain.  Weight-working on weight loss Substance abuse  none   Reviewed  recommended immunizations.   Health Maintenance  Topic Date Due   HEMOGLOBIN A1C  08/23/2023   Diabetic kidney evaluation - Urine ACR  08/24/2023   COVID-19 Vaccine (4 - 2023-24 season) 11/30/2023 (Originally 09/01/2023)   OPHTHALMOLOGY EXAM  11/15/2023   Diabetic kidney evaluation - eGFR measurement  02/23/2024   MAMMOGRAM  03/03/2024   FOOT EXAM  11/13/2024   Colonoscopy  05/15/2026   Cervical Cancer Screening (HPV/Pap Cotest)  10/05/2027   DTaP/Tdap/Td (4 - Td or  Tdap) 07/29/2031   INFLUENZA VACCINE  Completed   Hepatitis C Screening  Completed   HIV Screening  Completed   Zoster Vaccines- Shingrix  Completed   HPV VACCINES  Aged Out          See Problem List for Assessment and Plan of chronic medical problems.

## 2023-11-14 ENCOUNTER — Ambulatory Visit: Payer: BC Managed Care – PPO | Admitting: Internal Medicine

## 2023-11-14 ENCOUNTER — Encounter: Payer: Self-pay | Admitting: Internal Medicine

## 2023-11-14 VITALS — BP 118/74 | HR 68 | Temp 98.4°F | Ht 62.0 in | Wt 304.0 lb

## 2023-11-14 DIAGNOSIS — Z0000 Encounter for general adult medical examination without abnormal findings: Secondary | ICD-10-CM

## 2023-11-14 DIAGNOSIS — Z7984 Long term (current) use of oral hypoglycemic drugs: Secondary | ICD-10-CM

## 2023-11-14 DIAGNOSIS — E118 Type 2 diabetes mellitus with unspecified complications: Secondary | ICD-10-CM

## 2023-11-14 DIAGNOSIS — K219 Gastro-esophageal reflux disease without esophagitis: Secondary | ICD-10-CM

## 2023-11-14 DIAGNOSIS — E559 Vitamin D deficiency, unspecified: Secondary | ICD-10-CM

## 2023-11-14 DIAGNOSIS — E1159 Type 2 diabetes mellitus with other circulatory complications: Secondary | ICD-10-CM

## 2023-11-14 DIAGNOSIS — E785 Hyperlipidemia, unspecified: Secondary | ICD-10-CM | POA: Diagnosis not present

## 2023-11-14 DIAGNOSIS — E1169 Type 2 diabetes mellitus with other specified complication: Secondary | ICD-10-CM | POA: Diagnosis not present

## 2023-11-14 DIAGNOSIS — I152 Hypertension secondary to endocrine disorders: Secondary | ICD-10-CM | POA: Diagnosis not present

## 2023-11-14 LAB — COMPREHENSIVE METABOLIC PANEL
ALT: 23 U/L (ref 0–35)
AST: 17 U/L (ref 0–37)
Albumin: 4.3 g/dL (ref 3.5–5.2)
Alkaline Phosphatase: 63 U/L (ref 39–117)
BUN: 15 mg/dL (ref 6–23)
CO2: 31 meq/L (ref 19–32)
Calcium: 9.6 mg/dL (ref 8.4–10.5)
Chloride: 100 meq/L (ref 96–112)
Creatinine, Ser: 0.56 mg/dL (ref 0.40–1.20)
GFR: 98.88 mL/min (ref >60.00)
Glucose, Bld: 112 mg/dL — ABNORMAL HIGH (ref 70–99)
Potassium: 4 meq/L (ref 3.5–5.1)
Sodium: 138 meq/L (ref 135–145)
Total Bilirubin: 0.4 mg/dL (ref 0.2–1.2)
Total Protein: 7.3 g/dL (ref 6.0–8.3)

## 2023-11-14 LAB — LIPID PANEL
Cholesterol: 237 mg/dL — ABNORMAL HIGH (ref 0–200)
HDL: 57.7 mg/dL (ref >39.00)
LDL Cholesterol: 155 mg/dL — ABNORMAL HIGH (ref 0–99)
NonHDL: 179.06
Total CHOL/HDL Ratio: 4
Triglycerides: 118 mg/dL (ref 0.0–149.0)
VLDL: 23.6 mg/dL (ref 0.0–40.0)

## 2023-11-14 LAB — MICROALBUMIN / CREATININE URINE RATIO
Creatinine,U: 57.6 mg/dL
Microalb Creat Ratio: 1.2 mg/g (ref 0.0–30.0)
Microalb, Ur: <0.7 mg/dL (ref 0.0–1.9)

## 2023-11-14 LAB — CBC WITH DIFFERENTIAL/PLATELET
Basophils Absolute: 0.1 10*3/uL (ref 0.0–0.1)
Basophils Relative: 1.4 % (ref 0.0–3.0)
Eosinophils Absolute: 0.2 10*3/uL (ref 0.0–0.7)
Eosinophils Relative: 2.4 % (ref 0.0–5.0)
HCT: 42.2 % (ref 36.0–46.0)
Hemoglobin: 14 g/dL (ref 12.0–15.0)
Lymphocytes Relative: 25.4 % (ref 12.0–46.0)
Lymphs Abs: 1.9 10*3/uL (ref 0.7–4.0)
MCHC: 33.3 g/dL (ref 30.0–36.0)
MCV: 97.6 fL (ref 78.0–100.0)
Monocytes Absolute: 0.7 10*3/uL (ref 0.1–1.0)
Monocytes Relative: 9.5 % (ref 3.0–12.0)
Neutro Abs: 4.5 10*3/uL (ref 1.4–7.7)
Neutrophils Relative %: 61.3 % (ref 43.0–77.0)
Platelets: 343 10*3/uL (ref 150.0–400.0)
RBC: 4.32 Mil/uL (ref 3.87–5.11)
RDW: 13.6 % (ref 11.5–15.5)
WBC: 7.4 10*3/uL (ref 4.0–10.5)

## 2023-11-14 LAB — TSH: TSH: 2.54 u[IU]/mL (ref 0.35–5.50)

## 2023-11-14 LAB — VITAMIN D 25 HYDROXY (VIT D DEFICIENCY, FRACTURES): VITD: 28.57 ng/mL — ABNORMAL LOW (ref 30.00–100.00)

## 2023-11-14 LAB — HEMOGLOBIN A1C: Hgb A1c MFr Bld: 6.6 % — ABNORMAL HIGH (ref 4.6–6.5)

## 2023-11-14 NOTE — Assessment & Plan Note (Addendum)
Chronic Regular exercise and healthy diet encouraged Check lipid panel ASCVD risk was elevated but did improve- advised statin previously, which she deferred Continue lifestyle

## 2023-11-14 NOTE — Assessment & Plan Note (Signed)
Chronic GERD controlled Continue omeprazole 40 mg daily as needed

## 2023-11-14 NOTE — Assessment & Plan Note (Signed)
Chronic Taking vitamin D daily Check vitamin D level  

## 2023-11-14 NOTE — Assessment & Plan Note (Addendum)
Chronic  Lab Results  Component Value Date   HGBA1C 6.2 02/22/2023   Sugars well controlled Recently sugars have been a little higher at home-will see what her A1c is, but discussed that we can increase metformin to twice daily.  She did not tolerate the higher doses of Rybelsus, but may do okay with the lower doses or we could consider trying Trulicity Check A1c, urine microalbumin today Continue metformin 500 mg daily with breakfast Stressed regular exercise, diabetic diet

## 2023-11-14 NOTE — Assessment & Plan Note (Signed)
Chronic BP well controlled Continue hctz 25 mg daily, losartan 100 mg daily cmp

## 2023-11-20 LAB — HM DIABETES EYE EXAM

## 2023-12-11 ENCOUNTER — Other Ambulatory Visit: Payer: Self-pay | Admitting: Internal Medicine

## 2023-12-11 DIAGNOSIS — I152 Hypertension secondary to endocrine disorders: Secondary | ICD-10-CM

## 2023-12-20 ENCOUNTER — Other Ambulatory Visit: Payer: Self-pay

## 2023-12-20 ENCOUNTER — Telehealth: Payer: Self-pay

## 2023-12-20 ENCOUNTER — Other Ambulatory Visit: Payer: Self-pay | Admitting: Internal Medicine

## 2023-12-20 MED ORDER — FLUTICASONE PROPIONATE 50 MCG/ACT NA SUSP: 2.0000 | Freq: Every day | NASAL | 5 refills | Status: DC | PRN

## 2023-12-20 NOTE — Telephone Encounter (Signed)
Copied from CRM [id number]. Topic: Clinical - Medication Refill >> Dec 20, 2023  9:41 AM Pascal Lux wrote: Most Recent Primary Care Visit:  Provider: Pincus Sanes  Department: [facility]  Visit Type: PHYSICAL  Date: 11/14/2023  Medication: fluticasone (FLONASE) 50 MCG/ACT nasal spray [[id number]]  Has the patient contacted their pharmacy? Yes (Agent: If no, request that the patient contact the pharmacy for the refill. If patient does not wish to contact the pharmacy document the reason why and proceed with request.) (Agent: If yes, when and what did the pharmacy advise?)  Is this the correct pharmacy for this prescription? Yes If no, delete pharmacy and type the correct one.  This is the patient's preferred pharmacy:  [id number] [place], [place] - [address] [place] [address] Phone: [phone] Fax: [phone]   Has the prescription been filled recently? No  Is the patient out of the medication? Yes  Has the patient been seen for an appointment in the last year OR does the patient have an upcoming appointment? Yes  Can we respond through MyChart? Yes  Agent: Please be advised that Rx refills may take up to 3 business days. We ask that you follow-up with your pharmacy.

## 2023-12-20 NOTE — Telephone Encounter (Signed)
Sent in today 

## 2023-12-20 NOTE — Telephone Encounter (Signed)
Reason for CRM: Patient called regarding prescription refill. Attempted to route request system resolved instead ([id number]). Patient is in need of medication: fluticasone (FLONASE) 50 MCG/ACT nasal spray [[id number]].

## 2024-02-11 ENCOUNTER — Other Ambulatory Visit: Payer: Self-pay | Admitting: Internal Medicine

## 2024-02-11 DIAGNOSIS — E1169 Type 2 diabetes mellitus with other specified complication: Secondary | ICD-10-CM

## 2024-02-28 ENCOUNTER — Other Ambulatory Visit (HOSPITAL_BASED_OUTPATIENT_CLINIC_OR_DEPARTMENT_OTHER): Payer: Self-pay | Admitting: Internal Medicine

## 2024-02-28 DIAGNOSIS — Z139 Encounter for screening, unspecified: Secondary | ICD-10-CM

## 2024-03-02 ENCOUNTER — Other Ambulatory Visit: Payer: Self-pay | Admitting: Internal Medicine

## 2024-03-02 DIAGNOSIS — E1169 Type 2 diabetes mellitus with other specified complication: Secondary | ICD-10-CM

## 2024-03-02 DIAGNOSIS — I152 Hypertension secondary to endocrine disorders: Secondary | ICD-10-CM

## 2024-03-05 ENCOUNTER — Encounter (HOSPITAL_BASED_OUTPATIENT_CLINIC_OR_DEPARTMENT_OTHER): Payer: Self-pay

## 2024-03-05 ENCOUNTER — Ambulatory Visit (HOSPITAL_BASED_OUTPATIENT_CLINIC_OR_DEPARTMENT_OTHER)
Admission: RE | Admit: 2024-03-05 | Discharge: 2024-03-05 | Disposition: A | Discharge status: Home / Self Care | Payer: BC Managed Care – PPO | Source: Ambulatory Visit | Attending: Internal Medicine | Admitting: Internal Medicine

## 2024-03-05 DIAGNOSIS — Z139 Encounter for screening, unspecified: Secondary | ICD-10-CM

## 2024-03-05 DIAGNOSIS — Z1231 Encounter for screening mammogram for malignant neoplasm of breast: Secondary | ICD-10-CM | POA: Diagnosis present

## 2024-04-02 ENCOUNTER — Ambulatory Visit: Admitting: Family Medicine

## 2024-04-02 VITALS — BP 130/82 | HR 91 | Temp 96.0°F | Ht 62.0 in | Wt 295.0 lb

## 2024-04-02 DIAGNOSIS — R051 Acute cough: Secondary | ICD-10-CM | POA: Diagnosis not present

## 2024-04-02 DIAGNOSIS — H66003 Acute suppurative otitis media without spontaneous rupture of ear drum, bilateral: Secondary | ICD-10-CM

## 2024-04-02 DIAGNOSIS — R062 Wheezing: Secondary | ICD-10-CM | POA: Diagnosis not present

## 2024-04-02 LAB — POCT INFLUENZA A/B
Influenza A, POC: NEGATIVE
Influenza B, POC: NEGATIVE

## 2024-04-02 MED ORDER — AMOXICILLIN-POT CLAVULANATE 875-125 MG PO TABS: 1.0000 | ORAL_TABLET | Freq: Two times a day (BID) | ORAL | 0 refills | Status: DC

## 2024-04-02 MED ORDER — ALBUTEROL SULFATE HFA 108 (90 BASE) MCG/ACT IN AERS: 2.0000 | INHALATION_SPRAY | RESPIRATORY_TRACT | 1 refills | Status: AC | PRN

## 2024-04-02 MED ORDER — HYDROCODONE BIT-HOMATROP MBR 5-1.5 MG/5ML PO SOLN: 5.0000 mL | Freq: Three times a day (TID) | ORAL | 0 refills | Status: DC | PRN

## 2024-04-02 NOTE — Patient Instructions (Addendum)
 I have sent in Augmentin for you to take twice a day for 10 days.  This medication can upset your stomach, so I tell everyone to take it with a meal.  I have sent in an albuterol inhaler for you to use 2 puffs every 4 hours as needed for wheezing.  I have sent in hydrocodone cough syrup for you to take 5 mL once daily in the evening as needed for cough.  This medication may make you sleepy.  Do not drive or operate heavy machinery while taking this medication.  Follow-up with me for new or worsening symptoms.

## 2024-04-02 NOTE — Progress Notes (Signed)
 Acute Office Visit  Subjective:     Patient ID: Rachel Norris, female    DOB: 07-27-1961, 63 y.o.   MRN: [id number]  Chief Complaint  Patient presents with   Acute Visit    Ongoing since Saturday, negative home covid test. Right side facial pressure, coughing, slight wheezing, very hoarse, ears are bothering her. Was in the [place] this past week    HPI Patient is in today for evaluation of bilateral ear pain, cough, wheezing, fatigue, nasal congestion and right-sided facial pain, for the last 3 days. [place] reported increase in allergy symptoms for that week. Had negative home COVID test today. Has taken OTC antihistamine, but not consistently. Denies known sick contacts. Denies abdominal pain, nausea, vomiting, diarrhea, rash, fever, chills, other symptoms.  Medical hx as outlined below.  ROS Per HPI      Objective:    BP 130/82 (BP Location: Left Arm, Patient Position: Sitting)   Pulse 91   Temp (!) 96 F (35.6 C) (Temporal)   Ht 5\' 2"  (1.575 m)   Wt 295 lb (133.8 kg)   LMP 09/15/2016   SpO2 98%   BMI 53.96 kg/m    Physical Exam Vitals and nursing note reviewed.  Constitutional:      General: She is not in acute distress.    Appearance: She is obese.     Comments: Appears fatigued  HENT:     Head: Normocephalic and atraumatic.     Right Ear: External ear normal. A middle ear effusion is present. Tympanic membrane is erythematous and bulging.     Left Ear: External ear normal. A middle ear effusion is present. Tympanic membrane is erythematous and bulging.     Ears:     Comments: R worse than L    Nose: Congestion present.     Right Sinus: Maxillary sinus tenderness present.     Mouth/Throat:     Mouth: Mucous membranes are moist.     Pharynx: No oropharyngeal exudate or posterior oropharyngeal erythema.     Comments: Oropharyngeal cobblestoning  Hoarse voice Eyes:     Extraocular Movements: Extraocular  movements intact.  Cardiovascular:     Rate and Rhythm: Normal rate and regular rhythm.     Heart sounds: Normal heart sounds.  Pulmonary:     Effort: Pulmonary effort is normal. No respiratory distress.     Breath sounds: Wheezing present. No rhonchi or rales.     Comments: Dry cough Musculoskeletal:     Cervical back: Normal range of motion and neck supple.  Lymphadenopathy:     Cervical: Cervical adenopathy (R>L) present.  Skin:    General: Skin is warm and dry.  Neurological:     General: No focal deficit present.     Mental Status: She is alert and oriented to person, place, and time.     Results for orders placed or performed in visit on 04/02/24  POCT Influenza A/B  Result Value Ref Range   Influenza A, POC Negative Negative   Influenza B, POC Negative Negative        Assessment & Plan:   Non-recurrent acute suppurative otitis media of both ears without spontaneous rupture of tympanic membranes -     Amoxicillin-Pot Clavulanate; Take 1 tablet by mouth 2 (two) times daily.  Dispense: 20 tablet; Refill: 0  Acute cough -     POCT Influenza A/B -     HYDROcodone Bit-Homatrop MBr; Take 5  mLs by mouth every 8 (eight) hours as needed for cough.  Dispense: 120 mL; Refill: 0  Wheezing -     Albuterol Sulfate HFA; Inhale 2 puffs into the lungs every 4 (four) hours as needed for wheezing or shortness of breath.  Dispense: 18 g; Refill: 1  Viral testing negative   Meds ordered this encounter  Medications   albuterol (VENTOLIN HFA) 108 (90 Base) MCG/ACT inhaler    Sig: Inhale 2 puffs into the lungs every 4 (four) hours as needed for wheezing or shortness of breath.    Dispense:  18 g    Refill:  1   amoxicillin-clavulanate (AUGMENTIN) 875-125 MG tablet    Sig: Take 1 tablet by mouth 2 (two) times daily.    Dispense:  20 tablet    Refill:  0   HYDROcodone bit-homatropine (HYCODAN) 5-1.5 MG/5ML syrup    Sig: Take 5 mLs by mouth every 8 (eight) hours as needed for cough.     Dispense:  120 mL    Refill:  0    Return if symptoms worsen or fail to improve.  Sherald Barge, FNP

## 2024-04-10 ENCOUNTER — Other Ambulatory Visit: Payer: Self-pay | Admitting: Internal Medicine

## 2024-04-10 DIAGNOSIS — E1159 Type 2 diabetes mellitus with other circulatory complications: Secondary | ICD-10-CM

## 2024-04-20 ENCOUNTER — Other Ambulatory Visit: Payer: Self-pay | Admitting: Internal Medicine

## 2024-04-20 DIAGNOSIS — E1169 Type 2 diabetes mellitus with other specified complication: Secondary | ICD-10-CM

## 2024-04-20 MED ORDER — METFORMIN HCL 500 MG PO TABS: 500.0000 mg | ORAL_TABLET | Freq: Every day | ORAL | 0 refills | Status: DC

## 2024-04-20 NOTE — Telephone Encounter (Signed)
 Copied from CRM [id number]. Topic: Clinical - Medication Refill >> Apr 20, 2024  9:39 AM Rachel Norris wrote: Most Recent Primary Care Visit:  Provider: Wellington Half  Department: [facility]  Visit Type: ACUTE  Date: 04/02/2024  Medication: metFORMIN  (GLUCOPHAGE ) 500 MG tablet  Has the patient contacted their pharmacy? No (Agent: If no, request that the patient contact the pharmacy for the refill. If patient does not wish to contact the pharmacy document the reason why and proceed with request.) (Agent: If yes, when and what did the pharmacy advise?)  Is this the correct pharmacy for this prescription? Yes If no, delete pharmacy and type the correct one.  This is the patient's preferred pharmacy:  [id number] - [place], [place] - [address] [place] [address] Phone: [phone] Fax: [phone]   Has the prescription been filled recently? No  Is the patient out of the medication? No- patient is almost out of the medication because doctor advised patient increased frequency to 2 pills rather than 1 pill, however that began to use up all of her medication so she may have to go back down to 1 pill as she only has a few pills left. She asked that prescription be re-written and adjusted  to reflect what she and the doctor discussed. Thank you.   Has the patient been seen for an appointment in the last year OR does the patient have an upcoming appointment? Yes  Can we respond through MyChart? Yes  Agent: Please be advised that Rx refills may take up to 3 business days. We ask that you follow-up with your pharmacy.

## 2024-05-12 NOTE — Patient Instructions (Addendum)
      Blood work was ordered.       Medications changes include :   None    A referral was ordered and someone will call you to schedule an appointment.     Return in about 6 months (around 11/13/2024) for Physical Exam.

## 2024-05-12 NOTE — Progress Notes (Unsigned)
 Subjective:    Patient ID: Rachel Norris, female    DOB: 1961-12-02, 63 y.o.   MRN: [id number]     HPI Rachel Norris is here for follow up of her chronic medical problems.   Walking some for exercise.  Plans on getting back to gym.    Medications and allergies reviewed with patient and updated if appropriate.  Current Outpatient Medications on File Prior to Visit  Medication Sig Dispense Refill   ACCU-CHEK GUIDE test strip USE AS INSTRUCTED E11.9 100 strip 12   acetaminophen  (TYLENOL ) 650 MG CR tablet Take 1,300 mg by mouth every 8 (eight) hours as needed for pain.     albuterol  (VENTOLIN  HFA) 108 (90 Base) MCG/ACT inhaler Inhale 2 puffs into the lungs every 4 (four) hours as needed for wheezing or shortness of breath. 18 g 1   ascorbic acid (VITAMIN C) 1000 MG tablet Take by mouth.     B Complex Vitamins (B COMPLEX 1 PO) Take by mouth.     Collagen-Vitamin C-Biotin (COLLAGEN 1500/C PO) Take by mouth.     fexofenadine (ALLEGRA) 180 MG tablet Take 180 mg by mouth daily as needed for allergies or rhinitis.     fluticasone  (FLONASE ) 50 MCG/ACT nasal spray Place 2 sprays into both nostrils daily as needed for allergies. 16 g 5   hydrochlorothiazide  (HYDRODIURIL ) 25 MG tablet TAKE 1 TABLET BY MOUTH EVERY DAY 90 tablet 0   Lancets (ACCU-CHEK SOFT TOUCH) lancets CHECK BLOOD SUGAR TWICE DAILY 180 each 1   losartan  (COZAAR ) 100 MG tablet TAKE 1 TABLET BY MOUTH EVERY DAY 90 tablet 0   MAGNESIUM PO Take by mouth.     metFORMIN  (GLUCOPHAGE ) 500 MG tablet Take 1 tablet (500 mg total) by mouth daily with breakfast. (Patient taking differently: Take 500 mg by mouth daily with breakfast. Patient takes BID) 90 tablet 0   omeprazole  (PRILOSEC) 40 MG capsule TAKE 1 CAPSULE (40 MG TOTAL) BY MOUTH DAILY AS NEEDED (FOR HEARTBURN OR ACID REFLUX.). 90 capsule 3   Specialty Vitamins Products (VITAMINS FOR HAIR) CAPS Take by mouth.     VITAMIN D -VITAMIN K PO Take by mouth.     No current  facility-administered medications on file prior to visit.     Review of Systems  Constitutional:  Negative for fever.  Respiratory:  Positive for wheezing (the other day). Negative for cough and shortness of breath.   Cardiovascular:  Positive for palpitations (improved - has them with more caffeine) and leg swelling. Negative for chest pain.  Gastrointestinal:        Gerd controlled - occ gerd - takes medication prn  Neurological:  Negative for light-headedness and headaches.       Objective:   Vitals:   05/13/24 0854  BP: 130/76  Pulse: 68  Temp: 98 F (36.7 C)  SpO2: 98%   BP Readings from Last 3 Encounters:  05/13/24 130/76  04/02/24 130/82  11/14/23 118/74   Wt Readings from Last 3 Encounters:  05/13/24 298 lb (135.2 kg)  04/02/24 295 lb (133.8 kg)  11/14/23 (!) 304 lb (137.9 kg)   Body mass index is 54.5 kg/m.    Physical Exam Constitutional:      General: She is not in acute distress.    Appearance: Normal appearance.  HENT:     Head: Normocephalic and atraumatic.  Eyes:     Conjunctiva/sclera: Conjunctivae normal.  Cardiovascular:     Rate and Rhythm: Normal rate and regular  rhythm.     Heart sounds: Normal heart sounds.  Pulmonary:     Effort: Pulmonary effort is normal. No respiratory distress.     Breath sounds: Normal breath sounds. No wheezing.  Musculoskeletal:     Cervical back: Neck supple.     Right lower leg: No edema.     Left lower leg: No edema.  Lymphadenopathy:     Cervical: No cervical adenopathy.  Skin:    General: Skin is warm and dry.     Findings: No rash.  Neurological:     Mental Status: She is alert. Mental status is at baseline.  Psychiatric:        Mood and Affect: Mood normal.        Behavior: Behavior normal.       Diabetic Foot Exam - Simple   Simple Foot Form Diabetic Foot exam was performed with the following findings: Yes 05/13/2024  9:31 AM  Visual Inspection No deformities, no ulcerations, no other skin  breakdown bilaterally: Yes Sensation Testing Intact to touch and monofilament testing bilaterally: Yes Pulse Check Posterior Tibialis and Dorsalis pulse intact bilaterally: Yes Comments Some dry skin      Lab Results  Component Value Date   WBC 7.4 11/14/2023   HGB 14.0 11/14/2023   HCT 42.2 11/14/2023   PLT 343.0 11/14/2023   GLUCOSE 112 (H) 11/14/2023   CHOL 237 (H) 11/14/2023   TRIG 118.0 11/14/2023   HDL 57.70 11/14/2023   LDLDIRECT 144.2 07/20/2013   LDLCALC 155 (H) 11/14/2023   ALT 23 11/14/2023   AST 17 11/14/2023   NA 138 11/14/2023   K 4.0 11/14/2023   CL 100 11/14/2023   CREATININE 0.56 11/14/2023   BUN 15 11/14/2023   CO2 31 11/14/2023   TSH 2.54 11/14/2023   INR 0.9 07/09/2008   HGBA1C 6.6 (H) 11/14/2023   MICROALBUR <0.7 11/14/2023     Assessment & Plan:    See Problem List for Assessment and Plan of chronic medical problems.

## 2024-05-13 ENCOUNTER — Ambulatory Visit: Payer: Self-pay | Admitting: Internal Medicine

## 2024-05-13 ENCOUNTER — Ambulatory Visit: Payer: BC Managed Care – PPO | Admitting: Internal Medicine

## 2024-05-13 ENCOUNTER — Encounter: Payer: Self-pay | Admitting: Internal Medicine

## 2024-05-13 VITALS — BP 130/76 | HR 68 | Temp 98.0°F | Ht 62.0 in | Wt 298.0 lb

## 2024-05-13 DIAGNOSIS — E118 Type 2 diabetes mellitus with unspecified complications: Secondary | ICD-10-CM

## 2024-05-13 DIAGNOSIS — I152 Hypertension secondary to endocrine disorders: Secondary | ICD-10-CM | POA: Diagnosis not present

## 2024-05-13 DIAGNOSIS — E559 Vitamin D deficiency, unspecified: Secondary | ICD-10-CM

## 2024-05-13 DIAGNOSIS — E1159 Type 2 diabetes mellitus with other circulatory complications: Secondary | ICD-10-CM | POA: Diagnosis not present

## 2024-05-13 DIAGNOSIS — Z6841 Body Mass Index (BMI) 40.0 and over, adult: Secondary | ICD-10-CM

## 2024-05-13 DIAGNOSIS — Z8349 Family history of other endocrine, nutritional and metabolic diseases: Secondary | ICD-10-CM

## 2024-05-13 DIAGNOSIS — J4520 Mild intermittent asthma, uncomplicated: Secondary | ICD-10-CM | POA: Diagnosis not present

## 2024-05-13 DIAGNOSIS — E1169 Type 2 diabetes mellitus with other specified complication: Secondary | ICD-10-CM | POA: Diagnosis not present

## 2024-05-13 DIAGNOSIS — E785 Hyperlipidemia, unspecified: Secondary | ICD-10-CM

## 2024-05-13 DIAGNOSIS — Z136 Encounter for screening for cardiovascular disorders: Secondary | ICD-10-CM

## 2024-05-13 DIAGNOSIS — K219 Gastro-esophageal reflux disease without esophagitis: Secondary | ICD-10-CM | POA: Diagnosis not present

## 2024-05-13 LAB — COMPREHENSIVE METABOLIC PANEL WITH GFR
ALT: 18 U/L (ref 0–35)
AST: 15 U/L (ref 0–37)
Albumin: 4.2 g/dL (ref 3.5–5.2)
Alkaline Phosphatase: 57 U/L (ref 39–117)
BUN: 16 mg/dL (ref 6–23)
CO2: 30 meq/L (ref 19–32)
Calcium: 9.4 mg/dL (ref 8.4–10.5)
Chloride: 100 meq/L (ref 96–112)
Creatinine, Ser: 0.54 mg/dL (ref 0.40–1.20)
GFR: 99.4 mL/min (ref >60.00)
Glucose, Bld: 117 mg/dL — ABNORMAL HIGH (ref 70–99)
Potassium: 4.1 meq/L (ref 3.5–5.1)
Sodium: 137 meq/L (ref 135–145)
Total Bilirubin: 0.4 mg/dL (ref 0.2–1.2)
Total Protein: 7.2 g/dL (ref 6.0–8.3)

## 2024-05-13 LAB — IBC PANEL
Iron: 109 ug/dL (ref 42–145)
Saturation Ratios: 29.7 % (ref 20.0–50.0)
TIBC: 366.8 ug/dL (ref 250.0–450.0)
Transferrin: 262 mg/dL (ref 212.0–360.0)

## 2024-05-13 LAB — LIPID PANEL
Cholesterol: 240 mg/dL — ABNORMAL HIGH (ref 0–200)
HDL: 67.2 mg/dL (ref >39.00)
LDL Cholesterol: 154 mg/dL — ABNORMAL HIGH (ref 0–99)
NonHDL: 172.36
Total CHOL/HDL Ratio: 4
Triglycerides: 92 mg/dL (ref 0.0–149.0)
VLDL: 18.4 mg/dL (ref 0.0–40.0)

## 2024-05-13 LAB — VITAMIN D 25 HYDROXY (VIT D DEFICIENCY, FRACTURES): VITD: 28.06 ng/mL — ABNORMAL LOW (ref 30.00–100.00)

## 2024-05-13 LAB — HEMOGLOBIN A1C: Hgb A1c MFr Bld: 6.6 % — ABNORMAL HIGH (ref 4.6–6.5)

## 2024-05-13 LAB — MICROALBUMIN / CREATININE URINE RATIO
Creatinine,U: 58.3 mg/dL
Microalb Creat Ratio: UNDETERMINED mg/g (ref 0.0–30.0)
Microalb, Ur: <0.7 mg/dL

## 2024-05-13 LAB — FERRITIN: Ferritin: 107.1 ng/mL (ref 10.0–291.0)

## 2024-05-13 MED ORDER — METFORMIN HCL 500 MG PO TABS: 500.0000 mg | ORAL_TABLET | Freq: Two times a day (BID) | ORAL | 1 refills | Status: DC

## 2024-05-13 NOTE — Assessment & Plan Note (Signed)
Chronic BP well controlled Continue hctz 25 mg daily, losartan 100 mg daily cmp

## 2024-05-13 NOTE — Addendum Note (Signed)
 Addended by: Colene Dauphin on: 05/13/2024 09:45 AM   Modules accepted: Orders

## 2024-05-13 NOTE — Assessment & Plan Note (Signed)
Chronic GERD controlled Continue omeprazole 40 mg daily as needed

## 2024-05-13 NOTE — Assessment & Plan Note (Signed)
 Chronic Mild, intermittent - controlled Occasionally symptomatic with exposure to dust, pollen, URI Uses albuterol  prn - about 2/year

## 2024-05-13 NOTE — Assessment & Plan Note (Signed)
 Chronic Working on weight loss Continue regular exercise-encouraged is 5 days a week Continue continue diet high in protein, vegetables and smaller portions.  Low in sugars/carbs Continue metformin 

## 2024-05-13 NOTE — Assessment & Plan Note (Signed)
 Niece has it - ? From maternal or paternal side Iron panel

## 2024-05-13 NOTE — Assessment & Plan Note (Addendum)
 Chronic  Lab Results  Component Value Date   HGBA1C 6.6 (H) 11/14/2023   Sugars controlled Check A1c Continue metformin  500 mg bid  Stressed regular exercise, diabetic diet

## 2024-05-13 NOTE — Assessment & Plan Note (Signed)
 Chronic Taking vitamin D daily Check vitamin D level

## 2024-05-13 NOTE — Assessment & Plan Note (Addendum)
 Chronic Regular exercise and healthy diet encouraged Check lipid panel ASCVD risk was elevated but did improve- advised statin , which she deferred Continue lifestyle Ct cac ordered

## 2024-05-27 ENCOUNTER — Ambulatory Visit (HOSPITAL_BASED_OUTPATIENT_CLINIC_OR_DEPARTMENT_OTHER)
Admission: RE | Admit: 2024-05-27 | Discharge: 2024-05-27 | Disposition: A | Discharge status: Home / Self Care | Payer: Self-pay | Source: Ambulatory Visit | Attending: Internal Medicine | Admitting: Internal Medicine

## 2024-05-27 DIAGNOSIS — Z136 Encounter for screening for cardiovascular disorders: Secondary | ICD-10-CM | POA: Insufficient documentation

## 2024-05-30 ENCOUNTER — Other Ambulatory Visit: Payer: Self-pay | Admitting: Internal Medicine

## 2024-07-10 ENCOUNTER — Other Ambulatory Visit: Payer: Self-pay | Admitting: Internal Medicine

## 2024-08-14 ENCOUNTER — Ambulatory Visit: Payer: Self-pay

## 2024-08-14 ENCOUNTER — Telehealth: Admitting: Internal Medicine

## 2024-08-14 DIAGNOSIS — Z7984 Long term (current) use of oral hypoglycemic drugs: Secondary | ICD-10-CM | POA: Diagnosis not present

## 2024-08-14 DIAGNOSIS — U071 COVID-19: Secondary | ICD-10-CM | POA: Insufficient documentation

## 2024-08-14 DIAGNOSIS — E118 Type 2 diabetes mellitus with unspecified complications: Secondary | ICD-10-CM | POA: Diagnosis not present

## 2024-08-14 DIAGNOSIS — E559 Vitamin D deficiency, unspecified: Secondary | ICD-10-CM

## 2024-08-14 MED ORDER — PREDNISONE 10 MG PO TABS: ORAL_TABLET | ORAL | 0 refills | Status: DC

## 2024-08-14 MED ORDER — NIRMATRELVIR/RITONAVIR (PAXLOVID)TABLET: 3.0000 | ORAL_TABLET | Freq: Two times a day (BID) | ORAL | 0 refills | Status: AC

## 2024-08-14 NOTE — Patient Instructions (Signed)
 Please take all new medication as prescribed

## 2024-08-14 NOTE — Assessment & Plan Note (Signed)
 Last vitamin D  Lab Results  Component Value Date   VD25OH 28.06 (L) 05/13/2024   Low, to start oral replacement

## 2024-08-14 NOTE — Progress Notes (Signed)
 Patient ID: Rachel Norris, female   DOB: 03-05-1961, 63 y.o.   MRN: [id number]  Virtual Visit via Video Note  I connected with [place] [facility] on 08/14/24 at  3:00 PM EDT by a video enabled telemedicine application and verified that I am speaking with the correct person using two identifiers.  Location pf all participants today Patient:  at home Provider: at office   I discussed the limitations of evaluation and management by telemedicine and the availability of in person appointments. The patient expressed understanding and agreed to proceed.  History of Present Illness:  Here with 2-3 days acute onset fever, facial pain, pressure, headache, general weakness and malaise, and yellow green d/c, with mild ST and mod cough with mild wheezing sob doe, but pt denies chest pain, orthopnea, PND, increased LE swelling, palpitations, dizziness or syncope. Pt denies chest pain, increased sob or doe, wheezing, orthopnea, PND, increased LE swelling, palpitations, dizziness or syncope.   Pt denies polydipsia, polyuria, or new focal neuro s/s.    Pt denies fever, wt loss, night sweats, loss of appetite, or other constitutional symptoms   Past Medical History:  Diagnosis Date   Allergic rhinitis    Ankle swelling    Anxiety    Anxiety and depression    Arthritis    generalized arthritis -knees, feet.back.    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 - no Inhalers used daily.   De Quervain's tenosynovitis, right    Depression    Diabetes mellitus without complication (HCC)    pre diabetes   Edema, lower extremity    Fibroid    GERD (gastroesophageal reflux disease)    Heart palpitations    Hormone disorder    Hyperlipidemia    Hypertension    Irregular menstrual bleeding    Joint pain    Labial cyst    bilateral icclusion   Left  knee pain    Obesity    Palpitations    Perimenopausal    Plantar fasciitis, bilateral    PONV (postoperative nausea and vomiting)    Pre-diabetes    Prediabetes    Shortness of breath dyspnea    with exertion, climbing a flight of stairs   Tendonitis, Achilles, left    Thickened endometrium    Wears glasses    Past Surgical History:  Procedure Laterality Date   COLONOSCOPY WITH PROPOFOL  N/A 05/15/2016   Procedure: COLONOSCOPY WITH PROPOFOL ;  Surgeon: Gustav Shila GAILS, MD;  Location: [facility];  Service: Endoscopy;  Laterality: N/A;   DILATATION & CURETTAGE/HYSTEROSCOPY WITH MYOSURE N/A 08/30/2016   Procedure: DILATATION & CURETTAGE/HYSTEROSCOPY WITH MYOSURE;  Surgeon: Kate Hargis Nearing, MD;  Location: [facility];  Service: Gynecology;  Laterality: N/A;  hysteroscopic myomectomy. BMI 53.8   DILATION AND CURETTAGE OF UTERUS     EAR CYST EXCISION Bilateral 04/08/2015   Procedure: removal of bilateral labial inclusion cysts;  Surgeon: Duwaine Blumenthal, DO;  Location: [facility];  Service: Gynecology;  Laterality: Bilateral;   HYSTEROSCOPY     HYSTEROSCOPY WITH D & C N/A 04/08/2015   Procedure: DILATATION AND CURETTAGE /HYSTEROSCOPY, removal of bilateral labial inclusion cysts;  Surgeon: Duwaine Blumenthal, DO;  Location: [facility];  Service: Gynecology;  Laterality: N/A;   KNEE ARTHROSCOPY WITH MEDIAL MENISECTOMY Left 11/09/2019   Procedure: ARTHROSCOPY KNEE WITH PARTIAL MEDIAL MENISECTOMY, PARTIAL LATERAL MENISECTOMY, MEDIAL CHONDROPLASTY, MEDIAL PLICA EXCISION AND PATELLA FEMORAL CHONDROPLASTY;  Surgeon: Yvone Rush, MD;  Location: [facility];  Service: Orthopedics;  Laterality: Left;   LASER ABLATION OF THE CERVIX  1992   DYSPLAGIA   LASER ABLATION OF THE CERVIX     1991   MYOMECTOMY N/A 08/30/2016   Procedure: HYSTEROSCOPIC MYOMECTOMY;  Surgeon: Kate Hargis Nearing, MD;  Location: [facility];  Service: Gynecology;  Laterality: N/A;   SKIN CANCER EXCISION Left     upper chest   VIDEO ASSISTED THORACOSCOPY (VATS)/THOROCOTOMY Right 07-01-2008   dr gerhardt   w/ 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 Her smoking use included cigarettes. She started smoking about 48 years ago. She has a 20 pack-year smoking history. She has never used smokeless tobacco. She reports current alcohol use. She reports that she does not use drugs. family history includes AAA (abdominal aortic aneurysm) in her father; Allergic rhinitis in her brother, sister, and sister; Angioedema in her sister; Anxiety disorder in her mother; Asthma in her sister and another family member; Atrial fibrillation in her sister; Bronchitis in her sister and sister; Dementia in her mother; Diabetes in her mother; Heart disease in her father; Hyperlipidemia in her father; Hypertension in her father; Lung cancer in her sister; Stroke in her father and mother. Allergies  Allergen Reactions   Sulfa Antibiotics Hives   Current Outpatient Medications on File Prior to Visit  Medication Sig Dispense Refill   ACCU-CHEK GUIDE test strip USE AS INSTRUCTED E11.9 100 strip 12   acetaminophen  (TYLENOL ) 650 MG CR tablet Take 1,300 mg by mouth every 8 (eight) hours as needed for pain.     albuterol  (VENTOLIN  HFA) 108 (90 Base) MCG/ACT inhaler Inhale 2 puffs into the lungs every 4 (four) hours as needed for wheezing or shortness of breath. 18 g 1   ascorbic acid (VITAMIN C) 1000 MG tablet Take by mouth.     B Complex Vitamins (B COMPLEX 1 PO) Take by mouth.     Collagen-Vitamin C-Biotin (COLLAGEN 1500/C PO) Take by mouth.     fexofenadine (ALLEGRA) 180 MG tablet Take 180 mg by mouth daily as needed for allergies or rhinitis.     fluticasone  (FLONASE ) 50 MCG/ACT nasal spray PLACE 2 SPRAYS INTO BOTH NOSTRILS DAILY AS NEEDED FOR ALLERGIES. 48 mL 1   hydrochlorothiazide  (HYDRODIURIL ) 25 MG tablet TAKE 1 TABLET BY MOUTH EVERY DAY 90 tablet 0    Lancets (ACCU-CHEK SOFT TOUCH) lancets CHECK BLOOD SUGAR TWICE DAILY 180 each 1   losartan  (COZAAR ) 100 MG tablet TAKE 1 TABLET BY MOUTH EVERY DAY 90 tablet 0   MAGNESIUM PO Take by mouth.     metFORMIN  (GLUCOPHAGE ) 500 MG tablet Take 1 tablet (500 mg total) by mouth 2 (two) times daily with a meal. 180 tablet 1   omeprazole  (PRILOSEC) 40 MG capsule TAKE 1 CAPSULE (40 MG TOTAL) BY MOUTH DAILY AS NEEDED (FOR HEARTBURN OR ACID REFLUX.). 90 capsule 3   VITAMIN D -VITAMIN K PO Take by mouth.     No current facility-administered medications on file prior to visit.    Observations/Objective: Alert, midl ill appropriate mood and affect, resps normal, cn 2-12 intact, moves all 4s, no visible rash or swelling Lab Results  Component Value Date  WBC 7.4 11/14/2023   HGB 14.0 11/14/2023   HCT 42.2 11/14/2023   PLT 343.0 11/14/2023   GLUCOSE 117 (H) 05/13/2024   CHOL 240 (H) 05/13/2024   TRIG 92.0 05/13/2024   HDL 67.20 05/13/2024   LDLDIRECT 144.2 07/20/2013   LDLCALC 154 (H) 05/13/2024   ALT 18 05/13/2024   AST 15 05/13/2024   NA 137 05/13/2024   K 4.1 05/13/2024   CL 100 05/13/2024   CREATININE 0.54 05/13/2024   BUN 16 05/13/2024   CO2 30 05/13/2024   TSH 2.54 11/14/2023   INR 0.9 07/09/2008   HGBA1C 6.6 (H) 05/13/2024   MICROALBUR <0.7 05/13/2024   Assessment and Plan: See notes  Follow Up Instructions: See notes   I discussed the assessment and treatment plan with the patient. The patient was provided an opportunity to ask questions and all were answered. The patient agreed with the plan and demonstrated an understanding of the instructions.   The patient was advised to call back or seek an in-person evaluation if the symptoms worsen or if the condition fails to improve as anticipated.   Lynwood Rush, MD

## 2024-08-14 NOTE — Assessment & Plan Note (Signed)
 Lab Results  Component Value Date   HGBA1C 6.6 (H) 05/13/2024   Stable, pt to continue current medical treatment metfomrin 500 mg bid

## 2024-08-14 NOTE — Assessment & Plan Note (Signed)
 Mild to mod, for antibx course paxlovid , has cough med at home leftover other infection episode, inhaler helps and no refill needed, also for prednisone  taper for wheezing,  to f/u any worsening symptoms or concerns

## 2024-08-14 NOTE — Telephone Encounter (Signed)
 VV was done today.

## 2024-08-14 NOTE — Telephone Encounter (Signed)
 FYI Only or Action Required?: Action required by provider: request for appointment.  Patient was last seen in primary care on 05/13/2024 by Geofm Glade PARAS, MD.  Called Nurse Triage reporting Covid Positive.  Symptoms began yesterday.  Interventions attempted: Nothing.  Symptoms are: unchanged. COVID positive, exposure from husband. Cough,wheezing, fever, chills, nausea. Asking for Paxlovid  and nausea medication.  Triage Disposition: See HCP Within 4 Hours (Or PCP Triage)  Patient/caregiver understands and will follow disposition?: Yes   Copied from CRM #[id number]. Topic: Clinical - Red Word Triage >> Aug 14, 2024  8:10 AM Mia F wrote: Red Word that prompted transfer to Nurse Triage: Contracted covid from husband. Unable to eat, fever of 100.8, chills, cough, increased blood sugar, wheezing, headaches, bodyaches, increased thirst, no appetite. Reason for Disposition  MILD difficulty breathing (e.g., minimal/no SOB at rest, SOB with walking, pulse <100)  Answer Assessment - Initial Assessment Questions 1. COVID-19 DIAGNOSIS: How do you know that you have COVID? (e.g., positive lab test or self-test, diagnosed by doctor or NP/PA, symptoms after exposure).     Home test 2. COVID-19 EXPOSURE: Was there any known exposure to COVID before the symptoms began? CDC Definition of close contact: within 6 feet (2 meters) for a total of 15 minutes or more over a 24-hour period.      yes 3. ONSET: When did the COVID-19 symptoms start?      yesterday 4. WORST SYMPTOM: What is your worst symptom? (e.g., cough, fever, shortness of breath, muscle aches)     Nausea,cough, wheezing, chills, 100.8 temp 5. COUGH: Do you have a cough? If Yes, ask: How bad is the cough?       yes 6. FEVER: Do you have a fever? If Yes, ask: What is your temperature, how was it measured, and when did it start?     100.8 7. RESPIRATORY STATUS: Describe your breathing? (e.g., normal; shortness of breath,  wheezing, unable to speak)      wheezing 8. BETTER-SAME-WORSE: Are you getting better, staying the same or getting worse compared to yesterday?  If getting worse, ask, In what way?     better 9. OTHER SYMPTOMS: Do you have any other symptoms?  (e.g., chills, fatigue, headache, loss of smell or taste, muscle pain, sore throat)     yes 10. HIGH RISK DISEASE: Do you have any chronic medical problems? (e.g., asthma, heart or lung disease, weak immune system, obesity, etc.)       diabetic 11. VACCINE: Have you had the COVID-19 vaccine? If Yes, ask: Which one, how many shots, when did you get it?       N/a 12. PREGNANCY: Is there any chance you are pregnant? When was your last menstrual period?       no 13. O2 SATURATION MONITOR:  Do you use an oxygen saturation monitor (pulse oximeter) at home? If Yes, ask What is your reading (oxygen level) today? What is your usual oxygen saturation reading? (e.g., 95%)       N/a  Protocols used: Coronavirus (COVID-19) Diagnosed or Suspected-A-AH

## 2024-08-26 ENCOUNTER — Other Ambulatory Visit: Payer: Self-pay | Admitting: Internal Medicine

## 2024-08-26 DIAGNOSIS — E1159 Type 2 diabetes mellitus with other circulatory complications: Secondary | ICD-10-CM

## 2024-08-30 ENCOUNTER — Other Ambulatory Visit: Payer: Self-pay | Admitting: Internal Medicine

## 2024-10-12 ENCOUNTER — Ambulatory Visit: Payer: BC Managed Care – PPO | Admitting: Obstetrics and Gynecology

## 2024-10-13 ENCOUNTER — Ambulatory Visit: Admitting: Obstetrics and Gynecology

## 2024-10-13 ENCOUNTER — Other Ambulatory Visit (HOSPITAL_COMMUNITY)
Admission: RE | Admit: 2024-10-13 | Discharge: 2024-10-13 | Disposition: A | Source: Ambulatory Visit | Attending: Obstetrics and Gynecology | Admitting: Obstetrics and Gynecology

## 2024-10-13 ENCOUNTER — Encounter: Payer: Self-pay | Admitting: Obstetrics and Gynecology

## 2024-10-13 VITALS — BP 122/82 | HR 73 | Ht 62.0 in | Wt 306.6 lb

## 2024-10-13 DIAGNOSIS — B372 Candidiasis of skin and nail: Secondary | ICD-10-CM | POA: Diagnosis not present

## 2024-10-13 DIAGNOSIS — Z01419 Encounter for gynecological examination (general) (routine) without abnormal findings: Secondary | ICD-10-CM | POA: Diagnosis not present

## 2024-10-13 DIAGNOSIS — D219 Benign neoplasm of connective and other soft tissue, unspecified: Secondary | ICD-10-CM

## 2024-10-13 DIAGNOSIS — E049 Nontoxic goiter, unspecified: Secondary | ICD-10-CM | POA: Diagnosis not present

## 2024-10-13 DIAGNOSIS — R809 Proteinuria, unspecified: Secondary | ICD-10-CM

## 2024-10-13 DIAGNOSIS — N859 Noninflammatory disorder of uterus, unspecified: Secondary | ICD-10-CM

## 2024-10-13 DIAGNOSIS — E2839 Other primary ovarian failure: Secondary | ICD-10-CM

## 2024-10-13 DIAGNOSIS — K602 Anal fissure, unspecified: Secondary | ICD-10-CM

## 2024-10-13 DIAGNOSIS — Z1211 Encounter for screening for malignant neoplasm of colon: Secondary | ICD-10-CM

## 2024-10-13 DIAGNOSIS — E0829 Diabetes mellitus due to underlying condition with other diabetic kidney complication: Secondary | ICD-10-CM

## 2024-10-13 MED ORDER — NYSTATIN-TRIAMCINOLONE 100000-0.1 UNIT/GM-% EX OINT: 1.0000 | TOPICAL_OINTMENT | Freq: Two times a day (BID) | CUTANEOUS | 5 refills | Status: AC

## 2024-10-13 MED ORDER — MOUNJARO 2.5 MG/0.5ML ~~LOC~~ SOAJ: 2.5000 mg | SUBCUTANEOUS | 3 refills | Status: AC

## 2024-10-13 NOTE — Progress Notes (Signed)
 63 y.o. y.o. female here for annual exam. No HRT use No PMB since her D&C Patient's last menstrual period was 09/15/2016.  G0 presents for annual exam. Postmenopausal - no HRT, no bleeding. 1991 laser surgery on cervix. 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 H/O T2DM. Working on weight loss. Lost 80 pounds with diet a few years back. Has started to increase her exercise, has membership at [employer]. Did [facility] before and gained weight back. Will discuss mounjaro  with PCP, tried another weight loss medication and had reflux. And stopped in the past. To see what cost would be with [facility] pharmacy. Has rash under breasts that is bothersome Also with bothersome anal fissure H/o fibroids and D&C for thickened endometrial cancer   Body mass index is 56.08 kg/m.  Gynecologic History Patient's last menstrual period was 09/15/2016.   Contraception/Family planning: post menopausal status Sexually active: No  Health Maintenance Last Pap: 10/04/2022. 2025 Last mammogram: 03/04/2023. Results were: Normal Last colonoscopy: 05/15/2016. Results were: Normal, 10-year recall Last Dexa: Never  DM hga1c 6.6  Body mass index is 56.08 kg/m.    Blood pressure 122/82, pulse 73, height 5' 2 (1.575 m), weight (!) 306 lb 9.6 oz (139.1 kg), last menstrual period 09/15/2016, SpO2 98%.     Component Value Date/Time   DIAGPAP  10/04/2022 0957    - Negative for intraepithelial lesion or malignancy (NILM)   DIAGPAP  10/03/2021 1405    - Negative for Intraepithelial Lesions or Malignancy (NILM)   DIAGPAP - Benign reactive/reparative changes 10/03/2021 1405   HPVHIGH Negative 10/04/2022 0957   HPVHIGH Negative 10/03/2021 1405   HPVHIGH Positive (A) 08/25/2020 1119   ADEQPAP  10/04/2022 0957    Satisfactory for evaluation; transformation zone component PRESENT.   ADEQPAP  10/03/2021 1405    Satisfactory for evaluation; transformation zone component PRESENT.   ADEQPAP  08/25/2020 1119     Satisfactory for evaluation; transformation zone component PRESENT.    GYN HISTORY:    Component Value Date/Time   DIAGPAP  10/04/2022 0957    - Negative for intraepithelial lesion or malignancy (NILM)   DIAGPAP  10/03/2021 1405    - Negative for Intraepithelial Lesions or Malignancy (NILM)   DIAGPAP - Benign reactive/reparative changes 10/03/2021 1405   HPVHIGH Negative 10/04/2022 0957   HPVHIGH Negative 10/03/2021 1405   HPVHIGH Positive (A) 08/25/2020 1119   ADEQPAP  10/04/2022 0957    Satisfactory for evaluation; transformation zone component PRESENT.   ADEQPAP  10/03/2021 1405    Satisfactory for evaluation; transformation zone component PRESENT.   ADEQPAP  08/25/2020 1119    Satisfactory for evaluation; transformation zone component PRESENT.    OB History  Gravida Para Term Preterm AB Living  0 0 0 0 0 0  SAB IAB Ectopic Multiple Live Births  0 0 0 0     Past Medical History:  Diagnosis Date  . Allergic rhinitis   . Ankle swelling   . Anxiety   . Anxiety and depression   . Arthritis    generalized arthritis -knees, feet.back.   . Asthma    Dx 2009-granuloma on the lung  . At risk for sleep apnea    STOP-BANG= 5            SENT TO PCP 04-05-2015  . Back pain   . Bronchitis    Bronchitis a few months ago- no issues now. - no Inhalers used daily.  SABRA Sheerer Quervain's tenosynovitis, right   .  Depression   . Diabetes mellitus without complication (HCC)    pre diabetes  . Edema, lower extremity   . Fibroid   . GERD (gastroesophageal reflux disease)   . Heart palpitations   . Hormone disorder   . Hyperlipidemia   . Hypertension   . Irregular menstrual bleeding   . Joint pain   . Labial cyst    bilateral icclusion  . Left knee pain   . Obesity   . Palpitations   . Perimenopausal   . Plantar fasciitis, bilateral   . PONV (postoperative nausea and vomiting)   . Pre-diabetes   . Prediabetes   . Shortness of breath dyspnea    with exertion, climbing a  flight of stairs  . Tendonitis, Achilles, left   . Thickened endometrium   . Wears glasses     Past Surgical History:  Procedure Laterality Date  . COLONOSCOPY WITH PROPOFOL  N/A 05/15/2016   Procedure: COLONOSCOPY WITH PROPOFOL ;  Surgeon: Gustav Shila GAILS, MD;  Location: [facility];  Service: Endoscopy;  Laterality: N/A;  . DILATATION & CURETTAGE/HYSTEROSCOPY WITH MYOSURE N/A 08/30/2016   Procedure: DILATATION & CURETTAGE/HYSTEROSCOPY WITH MYOSURE;  Surgeon: Kate Hargis Nearing, MD;  Location: [facility];  Service: Gynecology;  Laterality: N/A;  hysteroscopic myomectomy. BMI 53.8  . DILATION AND CURETTAGE OF UTERUS    . EAR CYST EXCISION Bilateral 04/08/2015   Procedure: removal of bilateral labial inclusion cysts;  Surgeon: Duwaine Blumenthal, DO;  Location: [facility];  Service: Gynecology;  Laterality: Bilateral;  . HYSTEROSCOPY    . HYSTEROSCOPY WITH D & C N/A 04/08/2015   Procedure: DILATATION AND CURETTAGE /HYSTEROSCOPY, removal of bilateral labial inclusion cysts;  Surgeon: Duwaine Blumenthal, DO;  Location: [facility];  Service: Gynecology;  Laterality: N/A;  . KNEE ARTHROSCOPY WITH MEDIAL MENISECTOMY Left 11/09/2019   Procedure: ARTHROSCOPY KNEE WITH PARTIAL MEDIAL MENISECTOMY, PARTIAL LATERAL MENISECTOMY, MEDIAL CHONDROPLASTY, MEDIAL PLICA EXCISION AND PATELLA FEMORAL CHONDROPLASTY;  Surgeon: Yvone Rush, MD;  Location: [facility];  Service: Orthopedics;  Laterality: Left;  . LASER ABLATION OF THE CERVIX  1992   DYSPLAGIA  . LASER ABLATION OF THE CERVIX     1991  . MYOMECTOMY N/A 08/30/2016   Procedure: HYSTEROSCOPIC MYOMECTOMY;  Surgeon: Kate Hargis Nearing, MD;  Location: [facility];  Service: Gynecology;  Laterality: N/A;  . SKIN CANCER EXCISION Left    upper chest  . 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 ACCU-CHEK GUIDE test strip USE AS INSTRUCTED E11.9 100 strip 12  . acetaminophen  (TYLENOL ) 650 MG CR tablet Take 1,300 mg by mouth every 8 (eight) hours as needed for pain.    . albuterol  (VENTOLIN  HFA) 108 (90 Base) MCG/ACT inhaler Inhale 2 puffs into the lungs every 4 (four) hours as needed for wheezing or shortness of breath. 18 g 1  . clobetasol cream (TEMOVATE) 0.05 % 1 Application.    . Collagen-Vitamin C-Biotin (COLLAGEN 1500/C PO) Take by mouth.    . fexofenadine (ALLEGRA) 180 MG tablet Take 180 mg by mouth daily as needed for allergies or rhinitis.    . Fluocinolone Acetonide 0.01 % OIL Apply 1-2 drops into the affected (itching ear canal) daily as needed    . fluticasone  (FLONASE ) 50 MCG/ACT nasal spray PLACE 2 SPRAYS INTO BOTH NOSTRILS DAILY AS  NEEDED FOR ALLERGIES. 48 mL 1  . hydrochlorothiazide  (HYDRODIURIL ) 25 MG tablet TAKE 1 TABLET BY MOUTH EVERY DAY 90 tablet 0  . hydrocortisone cream 1 % 1 Application.    . Lancets (ACCU-CHEK SOFT TOUCH) lancets CHECK BLOOD SUGAR TWICE DAILY 180 each 1  . losartan  (COZAAR ) 100 MG tablet TAKE 1 TABLET BY MOUTH EVERY DAY 90 tablet 0  . MAGNESIUM PO Take by mouth.    . metFORMIN  (GLUCOPHAGE ) 500 MG tablet Take 1 tablet (500 mg total) by mouth 2 (two) times daily with a meal. 180 tablet 1  . omeprazole  (PRILOSEC) 40 MG capsule TAKE 1 CAPSULE (40 MG TOTAL) BY MOUTH DAILY AS NEEDED (FOR HEARTBURN OR ACID REFLUX.). 90 capsule 3  . VITAMIN D -VITAMIN K PO Take by mouth.    . Magnesium Glycinate 120 MG CAPS as directed Orally    . predniSONE  (DELTASONE ) 10 MG tablet 3 tabs by mouth per day for 3 days,2tabs per day for 3 days,1tab per day for 3 days (Patient not taking: Reported on 10/13/2024) 18 tablet 0   No current facility-administered medications on file prior to visit.    Social History   Socioeconomic History  . Marital status: Married    Spouse name: Not on file  . Number of children: 0  .  Years of education: Not on file  . Highest education level: Associate degree: occupational, Scientist, product/process development, or vocational program  Occupational History  . 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 Smoking status: Former    Current packs/day: 0.00    Average packs/day: 1 pack/day for 20.0 years (20.0 ttl pk-yrs)    Types: Cigarettes    Start date: 04/04/1976    Quit date: 04/04/1996    Years since quitting: 28.5  . Smokeless tobacco: Never  Vaping Use  . Vaping status: Never Used  Substance and Sexual Activity  . Alcohol use: Yes    Alcohol/week: 0.0 standard drinks of alcohol    Comment: rarely  . Drug use: No  . Sexual activity: Not Currently    Partners: Male    Birth control/protection: Post-menopausal  Other Topics Concern  . Not on file  Social History Narrative   Lives w/ husband    Retired 10-2019   Social Drivers of [employer]: Low Risk  (08/14/2024)   Overall Financial Resource Strain (CARDIA)   . Difficulty of Paying Living Expenses: Not hard at all  Food Insecurity: No Food Insecurity (08/14/2024)   Hunger Vital Sign   . Worried About Programme researcher, broadcasting/film/video in the Last Year: Never true   . Ran Out of Food in the Last Year: Never true  Transportation Needs: No Transportation Needs (08/14/2024)   PRAPARE - Transportation   . Lack of Transportation (Medical): No   . Lack of Transportation (Non-Medical): No  Physical Activity: Insufficiently Active (08/14/2024)   Exercise Vital Sign   . Days of Exercise per Week: 3 days   . Minutes of Exercise per Session: 40 min  Stress: No Stress Concern Present (08/14/2024)   [employer] of Occupational Health - Occupational Stress Questionnaire   . Feeling of Stress: Not at all  Social Connections: Moderately Integrated (08/14/2024)   Social Connection and Isolation Panel   . Frequency of Communication with Friends and Family: More than three times a  week   . Frequency of Social Gatherings with Friends and Family: Three times  a week   . Attends Religious Services: Patient declined   . Active Member of Clubs or Organizations: Yes   . Attends Banker Meetings: Patient declined   . Marital Status: Married  Catering manager Violence: Not on file    Family History  Problem Relation Age of Onset  . Diabetes Mother        M anmd others  . Dementia Mother   . Stroke Mother   . Anxiety disorder Mother   . Stroke Father   . AAA (abdominal aortic aneurysm) Father   . Hypertension Father   . Hyperlipidemia Father   . Heart disease Father   . Atrial fibrillation Sister   . Asthma Sister   . Allergic rhinitis Sister   . Bronchitis Sister   . Angioedema Sister   . Lung cancer Sister   . Allergic rhinitis Sister   . Bronchitis Sister   . Allergic rhinitis Brother   . Asthma Other   . Colon cancer Neg Hx   . Breast cancer Neg Hx   . Coronary artery disease Neg Hx   . Eczema Neg Hx   . Urticaria Neg Hx   . Immunodeficiency Neg Hx      Allergies  Allergen Reactions  . Sulfa Antibiotics Hives      Patient's last menstrual period was Patient's last menstrual period was 09/15/2016.SABRA            Review of Systems Alls systems reviewed and are negative.     Physical Exam Constitutional:      Appearance: Normal appearance.  Genitourinary:     Vulva and urethral meatus normal.     No lesions in the vagina.     Right Labia: No rash, lesions or skin changes.    Left Labia: No lesions, skin changes or rash.    No vaginal discharge or tenderness.     No vaginal prolapse present.    No vaginal atrophy present.     Right Adnexa: not tender, not palpable and no mass present.    Left Adnexa: not tender, not palpable and no mass present.    No cervical motion tenderness or discharge.     Uterus is enlarged and irregular.     Uterus is not tender.  Breasts:    Right: Normal.     Left: Normal.  HENT:     Head:  Normocephalic.      Comments: Left thyroid  fullness  Breast with yeast rash under each Neck:     Thyroid : No thyroid  mass, thyromegaly or thyroid  tenderness.  Cardiovascular:     Rate and Rhythm: Normal rate and regular rhythm.     Heart sounds: Normal heart sounds, S1 normal and S2 normal.  Pulmonary:     Effort: Pulmonary effort is normal.     Breath sounds: Normal breath sounds and air entry.  Abdominal:     General: There is no distension.     Palpations: Abdomen is soft. There is no mass.     Tenderness: There is no abdominal tenderness. There is no guarding or rebound.  Musculoskeletal:        General: Normal range of motion.     Cervical back: Full passive range of motion without pain, normal range of motion and neck supple. No tenderness.     Right lower leg: No edema.     Left lower leg: No edema.  Neurological:     Mental Status: She is alert.  Skin:  General: Skin is warm.  Psychiatric:        Mood and Affect: Mood normal.        Behavior: Behavior normal.        Thought Content: Thought content normal.  Vitals and nursing note reviewed. Exam conducted with a chaperone present.       A:         Well Woman GYN exam                             P:        Pap smear collected today h/o cryo, genital warts and HPV.  Counseled and offered off label use of gardesil vaccination. She will see what cost is. Encouraged annual mammogram screening Colon cancer screening referral placed today referral placed to colorectal surgeon as well for management of anal fissure DXA ordered today hypoestrogen with risks Left thyroid  fullness: to get thyroid  ultrasound Labs and immunizations to do with PMD Counseled on risk for endometrial cancer with obesity and unopposed estrogen. Encouraged patient to return with any PMB. She denies any hormones or progesterone. To get f/u PUS Encouraged healthy lifestyle practices Encouraged Vit D and Calcium   Sleep study to rule out apnea with  obesity and associated risks with it Nystatin powder sent for rash under breasts and thighs  Discussed primary can review and discuss and testing and can cancel, if she declines or she feels not indicated. Discussed having discussion on moujaro use with diabetes, cholesterol and obesity, but still encouraged that she needs diet and exercise and not a quick fix and a lifetime medication to maintain when ideal body weight is achieved. To see what her cost would be with [facility] and she will see her PMD before starting.  No follow-ups on file.  Rachel Norris

## 2024-10-16 ENCOUNTER — Telehealth: Payer: Self-pay | Admitting: *Deleted

## 2024-10-16 ENCOUNTER — Ambulatory Visit: Payer: Self-pay | Admitting: Obstetrics and Gynecology

## 2024-10-16 LAB — CYTOLOGY - PAP
Diagnosis: NEGATIVE
Diagnosis: REACTIVE

## 2024-10-16 NOTE — Telephone Encounter (Signed)
 Spoke with patient, advised per Dr. Glennon.  Patient will plan to further discuss at next AEX. Questions answered. Patient appreciative of call.   Encounter closed.

## 2024-10-16 NOTE — Telephone Encounter (Signed)
 Spoke with patient. Patient states she reviewed 10/13/24 PAP results. Reports Hx of HPV, asking why HPV not added to pap? Patient states she is not interested in HPV vaccine.   Advised I will review with Dr. Glennon and f/u, patient agreeable.   Dr. Glennon -please review and advise.

## 2024-11-15 ENCOUNTER — Encounter: Payer: Self-pay | Admitting: Internal Medicine

## 2024-11-15 NOTE — Progress Notes (Unsigned)
 Subjective:    Patient ID: Rachel Norris, female    DOB: 12/19/1961, 63 y.o.   MRN: [id number]      HPI Rachel Norris is here for a Physical exam and her chronic medical problems.   Overall doing well.  She had not been exercising regularly for a little while, but has started back at it over the past month and feels weaker.  She continues to have difficulty losing weight.   Medications and allergies reviewed with patient and updated if appropriate.  Current Outpatient Medications on File Prior to Visit  Medication Sig Dispense Refill   ACCU-CHEK GUIDE test strip USE AS INSTRUCTED E11.9 100 strip 12   acetaminophen  (TYLENOL ) 650 MG CR tablet Take 1,300 mg by mouth every 8 (eight) hours as needed for pain.     albuterol  (VENTOLIN  HFA) 108 (90 Base) MCG/ACT inhaler Inhale 2 puffs into the lungs every 4 (four) hours as needed for wheezing or shortness of breath. 18 g 1   clobetasol cream (TEMOVATE) 0.05 % 1 Application.     Collagen-Vitamin C-Biotin (COLLAGEN 1500/C PO) Take by mouth.     fexofenadine (ALLEGRA) 180 MG tablet Take 180 mg by mouth daily as needed for allergies or rhinitis.     Fluocinolone Acetonide 0.01 % OIL Apply 1-2 drops into the affected (itching ear canal) daily as needed     fluticasone  (FLONASE ) 50 MCG/ACT nasal spray PLACE 2 SPRAYS INTO BOTH NOSTRILS DAILY AS NEEDED FOR ALLERGIES. 48 mL 1   hydrochlorothiazide  (HYDRODIURIL ) 25 MG tablet TAKE 1 TABLET BY MOUTH EVERY DAY 90 tablet 0   Lancets (ACCU-CHEK SOFT TOUCH) lancets CHECK BLOOD SUGAR TWICE DAILY 180 each 1   losartan  (COZAAR ) 100 MG tablet TAKE 1 TABLET BY MOUTH EVERY DAY 90 tablet 0   Magnesium Glycinate 120 MG CAPS as directed Orally     metFORMIN  (GLUCOPHAGE ) 500 MG tablet Take 1 tablet (500 mg total) by mouth 2 (two) times daily with a meal. 180 tablet 1   omeprazole  (PRILOSEC) 40 MG capsule TAKE 1 CAPSULE (40 MG TOTAL) BY MOUTH DAILY AS NEEDED (FOR HEARTBURN OR ACID REFLUX.). 90 capsule 3   VITAMIN  D-VITAMIN K PO Take by mouth.     No current facility-administered medications on file prior to visit.    Review of Systems  Constitutional:  Negative for fever.  Eyes:  Negative for visual disturbance.  Respiratory:  Negative for cough, shortness of breath and wheezing.   Cardiovascular:  Positive for palpitations (rare) and leg swelling (occ with increased salt intake). Negative for chest pain.  Gastrointestinal:  Negative for abdominal pain, blood in stool, constipation and diarrhea.       No gerd  Genitourinary:  Positive for frequency. Negative for dysuria.  Musculoskeletal:  Positive for back pain (mild, occ). Negative for arthralgias.  Skin:  Positive for rash (hands - saw derm).  Neurological:  Negative for light-headedness and headaches.  Psychiatric/Behavioral:  Negative for dysphoric mood. The patient is not nervous/anxious.        Objective:   Vitals:   11/16/24 0825  BP: 126/70  Pulse: 66  Temp: 98.2 F (36.8 C)  SpO2: 96%   Filed Weights   11/16/24 0825  Weight: (!) 304 lb (137.9 kg)   Body mass index is 55.6 kg/m.  BP Readings from Last 3 Encounters:  11/16/24 126/70  10/13/24 122/82  05/13/24 130/76    Wt Readings from Last 3 Encounters:  11/16/24 (!) 304 lb (137.9 kg)  10/13/24 (!) 306 lb 9.6 oz (139.1 kg)  05/13/24 298 lb (135.2 kg)       Physical Exam Constitutional: She appears well-developed and well-nourished. No distress.  HENT:  Head: Normocephalic and atraumatic.  Right Ear: External ear normal. Normal ear canal and TM Left Ear: External ear normal.  Normal ear canal and TM Mouth/Throat: Oropharynx is clear and moist.  Eyes: Conjunctivae normal.  Neck: Neck supple. No tracheal deviation present.?  Thyromegaly present.  No carotid bruit  Cardiovascular: Normal rate, regular rhythm and normal heart sounds.   No murmur heard.  No edema. Pulmonary/Chest: Effort normal and breath sounds normal. No respiratory distress. She has no  wheezes. She has no rales.  Breast: deferred   Abdominal: Soft. She exhibits no distension. There is no tenderness.  Lymphadenopathy: She has no cervical adenopathy.  Skin: Skin is warm and dry. She is not diaphoretic.  Psychiatric: She has a normal mood and affect. Her behavior is normal.     Lab Results  Component Value Date   WBC 7.4 11/14/2023   HGB 14.0 11/14/2023   HCT 42.2 11/14/2023   PLT 343.0 11/14/2023   GLUCOSE 117 (H) 05/13/2024   CHOL 240 (H) 05/13/2024   TRIG 92.0 05/13/2024   HDL 67.20 05/13/2024   LDLDIRECT 144.2 07/20/2013   LDLCALC 154 (H) 05/13/2024   ALT 18 05/13/2024   AST 15 05/13/2024   NA 137 05/13/2024   K 4.1 05/13/2024   CL 100 05/13/2024   CREATININE 0.54 05/13/2024   BUN 16 05/13/2024   CO2 30 05/13/2024   TSH 2.54 11/14/2023   INR 0.9 07/09/2008   HGBA1C 6.6 (H) 05/13/2024   MICROALBUR <0.7 05/13/2024         Assessment & Plan:   Physical exam: Screening blood work  ordered Exercise  irregular - started back and now working out regularly x 1 month - going 5/week Weight  obese - morbid Substance abuse  none   Reviewed recommended immunizations. Prevnar 20 today   Health Maintenance  Topic Date Due   Pneumococcal Vaccine: 50+ Years (3 of 3 - PCV20 or PCV21) 07/11/2020   HEMOGLOBIN A1C  11/13/2024   COVID-19 Vaccine (4 - 2025-26 season) 12/01/2024 (Originally 08/31/2024)   OPHTHALMOLOGY EXAM  11/19/2024   Mammogram  03/05/2025   Diabetic kidney evaluation - eGFR measurement  05/13/2025   Diabetic kidney evaluation - Urine ACR  05/13/2025   FOOT EXAM  05/13/2025   Colonoscopy  05/15/2026   Cervical Cancer Screening (HPV/Pap Cotest)  10/13/2029   DTaP/Tdap/Td (4 - Td or Tdap) 07/29/2031   Influenza Vaccine  Completed   Hepatitis C Screening  Completed   HIV Screening  Completed   Zoster Vaccines- Shingrix  Completed   Hepatitis B Vaccines 19-59 Average Risk  Aged Out   HPV VACCINES  Aged Out   Meningococcal B Vaccine  Aged  Out          See Problem List for Assessment and Plan of chronic medical problems.

## 2024-11-15 NOTE — Patient Instructions (Addendum)
 Prevnar 20 given today   Blood work was ordered.       Medications changes include :   nitroglycerin ointment - sent to [facility].   Mounjaro  2.5 mg weekly    A thyroid  ultrasound will ordered when you let me know where you want to go and someone will call you to schedule an appointment.     Return in about 3 months (around 02/16/2025) for follow up.   Health Maintenance, Female Adopting a healthy lifestyle and getting preventive care are important in promoting health and wellness. Ask your health care provider about: The right schedule for you to have regular tests and exams. Things you can do on your own to prevent diseases and keep yourself healthy. What should I know about diet, weight, and exercise? Eat a healthy diet  Eat a diet that includes plenty of vegetables, fruits, low-fat dairy products, and lean protein. Do not eat a lot of foods that are high in solid fats, added sugars, or sodium. Maintain a healthy weight Body mass index (BMI) is used to identify weight problems. It estimates body fat based on height and weight. Your health care provider can help determine your BMI and help you achieve or maintain a healthy weight. Get regular exercise Get regular exercise. This is one of the most important things you can do for your health. Most adults should: Exercise for at least 150 minutes each week. The exercise should increase your heart rate and make you sweat (moderate-intensity exercise). Do strengthening exercises at least twice a week. This is in addition to the moderate-intensity exercise. Spend less time sitting. Even light physical activity can be beneficial. Watch cholesterol and blood lipids Have your blood tested for lipids and cholesterol at 62 years of age, then have this test every 5 years. Have your cholesterol levels checked more often if: Your lipid or cholesterol levels are high. You are older than 62 years of age. You are at high risk for heart  disease. What should I know about cancer screening? Depending on your health history and family history, you may need to have cancer screening at various ages. This may include screening for: Breast cancer. Cervical cancer. Colorectal cancer. Skin cancer. Lung cancer. What should I know about heart disease, diabetes, and high blood pressure? Blood pressure and heart disease High blood pressure causes heart disease and increases the risk of stroke. This is more likely to develop in people who have high blood pressure readings or are overweight. Have your blood pressure checked: Every 3-5 years if you are 25-78 years of age. Every year if you are 52 years old or older. Diabetes Have regular diabetes screenings. This checks your fasting blood sugar level. Have the screening done: Once every three years after age 15 if you are at a normal weight and have a low risk for diabetes. More often and at a younger age if you are overweight or have a high risk for diabetes. What should I know about preventing infection? Hepatitis B If you have a higher risk for hepatitis B, you should be screened for this virus. Talk with your health care provider to find out if you are at risk for hepatitis B infection. Hepatitis C Testing is recommended for: Everyone born from [birth year] through 1965. Anyone with known risk factors for hepatitis C. Sexually transmitted infections (STIs) Get screened for STIs, including gonorrhea and chlamydia, if: You are sexually active and are younger than 62 years of age. You are older  than 62 years of age and your health care provider tells you that you are at risk for this type of infection. Your sexual activity has changed since you were last screened, and you are at increased risk for chlamydia or gonorrhea. Ask your health care provider if you are at risk. Ask your health care provider about whether you are at high risk for HIV. Your health care provider may recommend a  prescription medicine to help prevent HIV infection. If you choose to take medicine to prevent HIV, you should first get tested for HIV. You should then be tested every 3 months for as long as you are taking the medicine. Pregnancy If you are about to stop having your period (premenopausal) and you may become pregnant, seek counseling before you get pregnant. Take 400 to 800 micrograms (mcg) of folic acid  every day if you become pregnant. Ask for birth control (contraception) if you want to prevent pregnancy. Osteoporosis and menopause Osteoporosis is a disease in which the bones lose minerals and strength with aging. This can result in bone fractures. If you are 17 years old or older, or if you are at risk for osteoporosis and fractures, ask your health care provider if you should: Be screened for bone loss. Take a calcium  or vitamin D  supplement to lower your risk of fractures. Be given hormone replacement therapy (HRT) to treat symptoms of menopause. Follow these instructions at home: Alcohol use Do not drink alcohol if: Your health care provider tells you not to drink. You are pregnant, may be pregnant, or are planning to become pregnant. If you drink alcohol: Limit how much you have to: 0-1 drink a day. Know how much alcohol is in your drink. In the [place] one drink equals one 12 oz bottle of beer (355 mL), one 5 oz glass of wine (148 mL), or one 1 oz glass of hard liquor (44 mL). Lifestyle Do not use any products that contain nicotine or tobacco. These products include cigarettes, chewing tobacco, and vaping devices, such as e-cigarettes. If you need help quitting, ask your health care provider. Do not use street drugs. Do not share needles. Ask your health care provider for help if you need support or information about quitting drugs. General instructions Schedule regular health, dental, and eye exams. Stay current with your vaccines. Tell your health care provider if: You often  feel depressed. You have ever been abused or do not feel safe at home. Summary Adopting a healthy lifestyle and getting preventive care are important in promoting health and wellness. Follow your health care provider's instructions about healthy diet, exercising, and getting tested or screened for diseases. Follow your health care provider's instructions on monitoring your cholesterol and blood pressure. This information is not intended to replace advice given to you by your health care provider. Make sure you discuss any questions you have with your health care provider. Document Revised: 05/08/2021 Document Reviewed: 05/08/2021 Elsevier Patient Education  2024 [employer] Inc[employer]   Health Maintenance, Female Adopting a healthy lifestyle and getting preventive care are important in promoting health and wellness. Ask your health care provider about: The right schedule for you to have regular tests and exams. Things you can do on your own to prevent diseases and keep yourself healthy. What should I know about diet, weight, and exercise? Eat a healthy diet  Eat a diet that includes plenty of vegetables, fruits, low-fat dairy products, and lean protein. Do not eat a lot of foods that are high  in solid fats, added sugars, or sodium. Maintain a healthy weight Body mass index (BMI) is used to identify weight problems. It estimates body fat based on height and weight. Your health care provider can help determine your BMI and help you achieve or maintain a healthy weight. Get regular exercise Get regular exercise. This is one of the most important things you can do for your health. Most adults should: Exercise for at least 150 minutes each week. The exercise should increase your heart rate and make you sweat (moderate-intensity exercise). Do strengthening exercises at least twice a week. This is in addition to the moderate-intensity exercise. Spend less time sitting. Even light physical activity can be  beneficial. Watch cholesterol and blood lipids Have your blood tested for lipids and cholesterol at 62 years of age, then have this test every 5 years. Have your cholesterol levels checked more often if: Your lipid or cholesterol levels are high. You are older than 62 years of age. You are at high risk for heart disease. What should I know about cancer screening? Depending on your health history and family history, you may need to have cancer screening at various ages. This may include screening for: Breast cancer. Cervical cancer. Colorectal cancer. Skin cancer. Lung cancer. What should I know about heart disease, diabetes, and high blood pressure? Blood pressure and heart disease High blood pressure causes heart disease and increases the risk of stroke. This is more likely to develop in people who have high blood pressure readings or are overweight. Have your blood pressure checked: Every 3-5 years if you are 29-29 years of age. Every year if you are 66 years old or older. Diabetes Have regular diabetes screenings. This checks your fasting blood sugar level. Have the screening done: Once every three years after age 9 if you are at a normal weight and have a low risk for diabetes. More often and at a younger age if you are overweight or have a high risk for diabetes. What should I know about preventing infection? Hepatitis B If you have a higher risk for hepatitis B, you should be screened for this virus. Talk with your health care provider to find out if you are at risk for hepatitis B infection. Hepatitis C Testing is recommended for: Everyone born from [birth year] through 1965. Anyone with known risk factors for hepatitis C. Sexually transmitted infections (STIs) Get screened for STIs, including gonorrhea and chlamydia, if: You are sexually active and are younger than 62 years of age. You are older than 62 years of age and your health care provider tells you that you are at risk for  this type of infection. Your sexual activity has changed since you were last screened, and you are at increased risk for chlamydia or gonorrhea. Ask your health care provider if you are at risk. Ask your health care provider about whether you are at high risk for HIV. Your health care provider may recommend a prescription medicine to help prevent HIV infection. If you choose to take medicine to prevent HIV, you should first get tested for HIV. You should then be tested every 3 months for as long as you are taking the medicine. Pregnancy If you are about to stop having your period (premenopausal) and you may become pregnant, seek counseling before you get pregnant. Take 400 to 800 micrograms (mcg) of folic acid  every day if you become pregnant. Ask for birth control (contraception) if you want to prevent pregnancy. Osteoporosis and menopause Osteoporosis is  a disease in which the bones lose minerals and strength with aging. This can result in bone fractures. If you are 59 years old or older, or if you are at risk for osteoporosis and fractures, ask your health care provider if you should: Be screened for bone loss. Take a calcium  or vitamin D  supplement to lower your risk of fractures. Be given hormone replacement therapy (HRT) to treat symptoms of menopause. Follow these instructions at home: Alcohol use Do not drink alcohol if: Your health care provider tells you not to drink. You are pregnant, may be pregnant, or are planning to become pregnant. If you drink alcohol: Limit how much you have to: 0-1 drink a day. Know how much alcohol is in your drink. In the [place] one drink equals one 12 oz bottle of beer (355 mL), one 5 oz glass of wine (148 mL), or one 1 oz glass of hard liquor (44 mL). Lifestyle Do not use any products that contain nicotine or tobacco. These products include cigarettes, chewing tobacco, and vaping devices, such as e-cigarettes. If you need help quitting, ask your health  care provider. Do not use street drugs. Do not share needles. Ask your health care provider for help if you need support or information about quitting drugs. General instructions Schedule regular health, dental, and eye exams. Stay current with your vaccines. Tell your health care provider if: You often feel depressed. You have ever been abused or do not feel safe at home. Summary Adopting a healthy lifestyle and getting preventive care are important in promoting health and wellness. Follow your health care provider's instructions about healthy diet, exercising, and getting tested or screened for diseases. Follow your health care provider's instructions on monitoring your cholesterol and blood pressure. This information is not intended to replace advice given to you by your health care provider. Make sure you discuss any questions you have with your health care provider. Document Revised: 05/08/2021 Document Reviewed: 05/08/2021 Elsevier Patient Education  2024 [employer].

## 2024-11-16 ENCOUNTER — Other Ambulatory Visit (HOSPITAL_COMMUNITY): Payer: Self-pay

## 2024-11-16 ENCOUNTER — Telehealth: Payer: Self-pay

## 2024-11-16 ENCOUNTER — Ambulatory Visit: Admitting: Internal Medicine

## 2024-11-16 VITALS — BP 126/70 | HR 66 | Temp 98.2°F | Ht 62.0 in | Wt 304.0 lb

## 2024-11-16 DIAGNOSIS — E559 Vitamin D deficiency, unspecified: Secondary | ICD-10-CM

## 2024-11-16 DIAGNOSIS — J4520 Mild intermittent asthma, uncomplicated: Secondary | ICD-10-CM | POA: Diagnosis not present

## 2024-11-16 DIAGNOSIS — I152 Hypertension secondary to endocrine disorders: Secondary | ICD-10-CM | POA: Diagnosis not present

## 2024-11-16 DIAGNOSIS — Z23 Encounter for immunization: Secondary | ICD-10-CM

## 2024-11-16 DIAGNOSIS — E118 Type 2 diabetes mellitus with unspecified complications: Secondary | ICD-10-CM

## 2024-11-16 DIAGNOSIS — K219 Gastro-esophageal reflux disease without esophagitis: Secondary | ICD-10-CM

## 2024-11-16 DIAGNOSIS — E785 Hyperlipidemia, unspecified: Secondary | ICD-10-CM | POA: Diagnosis not present

## 2024-11-16 DIAGNOSIS — E010 Iodine-deficiency related diffuse (endemic) goiter: Secondary | ICD-10-CM

## 2024-11-16 DIAGNOSIS — Z0000 Encounter for general adult medical examination without abnormal findings: Secondary | ICD-10-CM | POA: Diagnosis not present

## 2024-11-16 DIAGNOSIS — E1159 Type 2 diabetes mellitus with other circulatory complications: Secondary | ICD-10-CM

## 2024-11-16 DIAGNOSIS — E1169 Type 2 diabetes mellitus with other specified complication: Secondary | ICD-10-CM

## 2024-11-16 DIAGNOSIS — Z6841 Body Mass Index (BMI) 40.0 and over, adult: Secondary | ICD-10-CM

## 2024-11-16 LAB — COMPREHENSIVE METABOLIC PANEL WITH GFR
ALT: 19 U/L (ref 0–35)
AST: 16 U/L (ref 0–37)
Albumin: 4.3 g/dL (ref 3.5–5.2)
Alkaline Phosphatase: 48 U/L (ref 39–117)
BUN: 15 mg/dL (ref 6–23)
CO2: 27 meq/L (ref 19–32)
Calcium: 9.6 mg/dL (ref 8.4–10.5)
Chloride: 100 meq/L (ref 96–112)
Creatinine, Ser: 0.52 mg/dL (ref 0.40–1.20)
GFR: 99.95 mL/min (ref >60.00)
Glucose, Bld: 110 mg/dL — ABNORMAL HIGH (ref 70–99)
Potassium: 3.5 meq/L (ref 3.5–5.1)
Sodium: 138 meq/L (ref 135–145)
Total Bilirubin: 0.4 mg/dL (ref 0.2–1.2)
Total Protein: 7.2 g/dL (ref 6.0–8.3)

## 2024-11-16 LAB — LIPID PANEL
Cholesterol: 221 mg/dL — ABNORMAL HIGH (ref 0–200)
HDL: 60 mg/dL (ref >39.00)
LDL Cholesterol: 141 mg/dL — ABNORMAL HIGH (ref 0–99)
NonHDL: 160.77
Total CHOL/HDL Ratio: 4
Triglycerides: 101 mg/dL (ref 0.0–149.0)
VLDL: 20.2 mg/dL (ref 0.0–40.0)

## 2024-11-16 LAB — CBC
HCT: 40 % (ref 36.0–46.0)
Hemoglobin: 13.7 g/dL (ref 12.0–15.0)
MCHC: 34.2 g/dL (ref 30.0–36.0)
MCV: 96.4 fl (ref 78.0–100.0)
Platelets: 312 K/uL (ref 150.0–400.0)
RBC: 4.15 Mil/uL (ref 3.87–5.11)
RDW: 13.3 % (ref 11.5–15.5)
WBC: 6.1 K/uL (ref 4.0–10.5)

## 2024-11-16 LAB — VITAMIN D 25 HYDROXY (VIT D DEFICIENCY, FRACTURES): VITD: 43.41 ng/mL (ref 30.00–100.00)

## 2024-11-16 LAB — TSH: TSH: 2.16 u[IU]/mL (ref 0.35–5.50)

## 2024-11-16 MED ORDER — TIRZEPATIDE 2.5 MG/0.5ML ~~LOC~~ SOAJ: 2.5000 mg | SUBCUTANEOUS | 0 refills | Status: DC

## 2024-11-16 MED ORDER — BLOOD GLUCOSE MONITORING SUPPL DEVI: 0 refills | Status: AC

## 2024-11-16 MED ORDER — LANCET DEVICE MISC: 0 refills | Status: AC

## 2024-11-16 MED ORDER — LANCETS MISC: 1.0000 | 3 refills | Status: AC

## 2024-11-16 MED ORDER — NITROGLYCERIN 0.4 % RE OINT: TOPICAL_OINTMENT | RECTAL | 2 refills | Status: AC

## 2024-11-16 MED ORDER — BLOOD GLUCOSE TEST VI STRP: ORAL_STRIP | 3 refills | Status: AC

## 2024-11-16 NOTE — Assessment & Plan Note (Signed)
 Chronic BP well controlled Continue hctz 25 mg daily, losartan  100 mg daily Cmp, cbc

## 2024-11-16 NOTE — Telephone Encounter (Signed)
 Copied from CRM #[id number]. Topic: General - Other >> Nov 16, 2024 12:29 PM Rachel Norris wrote: Reason for CRM: Pt is calling to inform Dr.Burns that [facility] only offers PET,CT, and MRI scans. Pt stated that they don't offer ultrasound only and would like for Dr.Burns to send a referral to an imaging office in [place] that she recommends. Pt would like a callback with an update.

## 2024-11-16 NOTE — Assessment & Plan Note (Signed)
 Possible thyromegaly on exam Ultrasound ordered TSH

## 2024-11-16 NOTE — Assessment & Plan Note (Signed)
 Chronic Taking vitamin D daily Check vitamin D level

## 2024-11-16 NOTE — Assessment & Plan Note (Signed)
Chronic GERD controlled Continue omeprazole 40 mg daily as needed

## 2024-11-16 NOTE — Assessment & Plan Note (Addendum)
 Chronic Associated with obesity, hyperlipidemia, hypertension Lab Results  Component Value Date   HGBA1C 6.6 (H) 05/13/2024   Sugars controlled Check A1c Continue metformin  500 mg bid  Start Mounjaro  2.5 mg weekly - reviewed side effects Stressed regular exercise, diabetic diet

## 2024-11-16 NOTE — Assessment & Plan Note (Signed)
 Chronic Mild, intermittent - controlled Occasionally symptomatic with exposure to dust, pollen, URI Uses albuterol  prn - about 2/year

## 2024-11-16 NOTE — Assessment & Plan Note (Addendum)
 Chronic BMI 55.60, per meter squared Working on weight loss, but has not been successful Continue regular exercise-continue 5 days a week Continue continue diet high in protein, vegetables and smaller portions.  Low in sugars/carbs Continue metformin  500 mg twice daily Will be starting Mounjaro  for diabetes and to help assist weight loss

## 2024-11-16 NOTE — Assessment & Plan Note (Addendum)
 Chronic Regular exercise and healthy diet encouraged Check lipid panel, cmp, tsh ASCVD risk was elevated, CT cac 15.8 Advised starting a statin-she would like to hold off. Will focus on weight loss Continue lifestyle

## 2024-11-17 ENCOUNTER — Telehealth (HOSPITAL_BASED_OUTPATIENT_CLINIC_OR_DEPARTMENT_OTHER): Payer: Self-pay

## 2024-11-17 ENCOUNTER — Ambulatory Visit: Payer: Self-pay | Admitting: Internal Medicine

## 2024-11-17 LAB — HEMOGLOBIN A1C: Hgb A1c MFr Bld: 6.5 % (ref 4.6–6.5)

## 2024-11-17 NOTE — Addendum Note (Signed)
 Addended by: GEOFM GLADE PARAS on: 11/17/2024 12:22 PM   Modules accepted: Orders

## 2024-11-17 NOTE — Telephone Encounter (Signed)
 We can do [employer] [facility] which is probably the easiest location for her-ordered

## 2024-11-24 ENCOUNTER — Other Ambulatory Visit: Payer: Self-pay | Admitting: Internal Medicine

## 2024-11-24 DIAGNOSIS — I152 Hypertension secondary to endocrine disorders: Secondary | ICD-10-CM

## 2024-11-25 ENCOUNTER — Encounter (HOSPITAL_BASED_OUTPATIENT_CLINIC_OR_DEPARTMENT_OTHER): Payer: Self-pay

## 2024-11-25 ENCOUNTER — Ambulatory Visit (HOSPITAL_BASED_OUTPATIENT_CLINIC_OR_DEPARTMENT_OTHER)
Admission: RE | Admit: 2024-11-25 | Discharge: 2024-11-25 | Disposition: A | Discharge status: Home / Self Care | Source: Ambulatory Visit | Attending: Obstetrics and Gynecology | Admitting: Obstetrics and Gynecology

## 2024-11-25 ENCOUNTER — Ambulatory Visit (HOSPITAL_BASED_OUTPATIENT_CLINIC_OR_DEPARTMENT_OTHER)
Admission: RE | Admit: 2024-11-25 | Discharge: 2024-11-25 | Disposition: A | Discharge status: Home / Self Care | Source: Ambulatory Visit | Attending: Internal Medicine | Admitting: Internal Medicine

## 2024-11-25 DIAGNOSIS — E010 Iodine-deficiency related diffuse (endemic) goiter: Secondary | ICD-10-CM

## 2024-11-25 DIAGNOSIS — N859 Noninflammatory disorder of uterus, unspecified: Secondary | ICD-10-CM | POA: Diagnosis present

## 2024-11-25 DIAGNOSIS — D219 Benign neoplasm of connective and other soft tissue, unspecified: Secondary | ICD-10-CM | POA: Insufficient documentation

## 2024-11-25 DIAGNOSIS — E2839 Other primary ovarian failure: Secondary | ICD-10-CM

## 2024-11-25 DIAGNOSIS — Z01419 Encounter for gynecological examination (general) (routine) without abnormal findings: Secondary | ICD-10-CM

## 2024-11-28 ENCOUNTER — Ambulatory Visit: Payer: Self-pay | Admitting: Internal Medicine

## 2024-11-28 ENCOUNTER — Other Ambulatory Visit: Payer: Self-pay | Admitting: Internal Medicine

## 2024-11-28 DIAGNOSIS — E1169 Type 2 diabetes mellitus with other specified complication: Secondary | ICD-10-CM

## 2024-12-14 ENCOUNTER — Encounter: Payer: Self-pay | Admitting: Obstetrics and Gynecology

## 2024-12-14 ENCOUNTER — Ambulatory Visit: Admitting: Obstetrics and Gynecology

## 2024-12-14 VITALS — BP 136/82 | HR 72 | Ht 62.0 in | Wt 297.0 lb

## 2024-12-14 DIAGNOSIS — E6609 Other obesity due to excess calories: Secondary | ICD-10-CM

## 2024-12-14 DIAGNOSIS — R9389 Abnormal findings on diagnostic imaging of other specified body structures: Secondary | ICD-10-CM

## 2024-12-15 ENCOUNTER — Other Ambulatory Visit (INDEPENDENT_AMBULATORY_CARE_PROVIDER_SITE_OTHER): Payer: Self-pay | Admitting: Family Medicine

## 2024-12-15 DIAGNOSIS — E118 Type 2 diabetes mellitus with unspecified complications: Secondary | ICD-10-CM

## 2024-12-15 NOTE — Progress Notes (Unsigned)
 Acute Office Visit  Subjective:    Patient ID: Rachel Norris, female    DOB: 22-Oct-1961, 63 y.o.   MRN: [id number]   HPI 63 y.o. presents today for office visit (Test results) .Thickened endometrial lining on PUS and fibroids   G0 presents for annual exam. Postmenopausal - no HRT, no bleeding. 1991 laser surgery on cervix. 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 H/O T2DM. Working on weight loss. Lost 80 pounds with diet a few years back. Has started to increase her exercise, has membership at [employer]. Did [facility] before and gained weight back. Will discuss mounjaro  with PCP, tried another weight loss medication and had reflux. And stopped in the past. To see what cost would be with [facility] pharmacy. Has rash under breasts that is bothersome Also with bothersome anal fissure H/o fibroids and D&C for thickened endometrial cancer    Wilbern Benne, RDMS Technician Specialty: Radiology   Progress Notes     Signed   Encounter Date: 04/29/2018   Signed                        Electronically signed by Wilbern Benne, RDMS at 04/29/2018  5:15 PM     Office Visit on 04/29/2018       Detailed Report    Additional Documentation  Vitals: BP 134/82 (BP Location: Right Arm, Patient Position: Sitting, Cuff Size: Large)   Pulse 72   Resp 14   Ht 5' 2.5 (1.588 m)   Wt 297 lb 6.4 oz (134.9 kg)   LMP 09/15/2016   BMI 53.53 kg/m   BSA 2.44 m      More Vitals  Flowsheets: MEWS Score,   Anthropometrics  Encounter Info: Billing Info,   History,   Allergies,   Detailed Report   Orders Placed   [facility]  Surgical pathology All Encounter Results Other Orders Performed  Endometrial biopsy Medication Changes   None Medication List Visit Diagnoses   Abnormal uterine bleeding (AUB)  Intramural and subserous leiomyoma of uterus Problem List  11/25/24 PUS results:    PACS Intelerad Image Link   Show images for US   PELVIC COMPLETE WITH TRANSVAGINAL Study Result  Narrative & Impression  CLINICAL DATA:  Initial evaluation for fibroids, endometrial thickening.   EXAM: TRANSABDOMINAL AND TRANSVAGINAL ULTRASOUND OF PELVIS   TECHNIQUE: Both transabdominal and transvaginal ultrasound examinations of the pelvis were performed. Transabdominal technique was performed for global imaging of the pelvis including uterus, ovaries, adnexal regions, and pelvic cul-de-sac. It was necessary to proceed with endovaginal exam following the transabdominal exam to visualize the endometrium.   COMPARISON:  Prior ultrasound from 04/29/2018.   FINDINGS: Uterus   Measurements: 7.6 x 3.7 x 6.7 cm = volume: 98.0 mL. Uterus is anteverted. Several fibroids seen as follows;   1. 3.4 x 2.3 x 2.7 cm intramural fibroid present at the right uterine fundus. 2. 1.3 x 1.3 x 1.2 cm intramural fibroid present at the anterior uterine body. 3. 4.3 x 3.9 x 4.1 cm intramural fibroid present at the left uterine body.   Endometrium   Thickness: 17 mm. Few scattered foci of internal vascularity seen within the thickened endometrial complex. No other focal abnormality.      Patient's last menstrual period was 09/15/2016.    Review of Systems     Objective:    OBGyn Exam  BP 136/82 (BP Location: Left Arm, Patient Position: Sitting, Cuff Size: Normal)   Pulse 72  Ht 5' 2 (1.575 m)   Wt 297 lb (134.7 kg)   LMP 09/15/2016   SpO2 96%   BMI 54.32 kg/m  Wt Readings from Last 3 Encounters:  12/14/24 297 lb (134.7 kg)  11/16/24 (!) 304 lb (137.9 kg)  10/13/24 (!) 306 lb 9.6 oz (139.1 kg)        Patient informed chaperone available to be present for breast and/or pelvic exam. Patient has requested no chaperone to be present. Patient has been advised what will be completed during breast and pelvic exam.   Assessment & Plan:  Obesity Thickened endometrial lining No PMB  US  with thickened lining discussed ddx such  as endometrial polyp, abnormal pathology such as precancerous or cancerous changes. Discussed polyps can cause bleeding and have associated risk for cancer <1% risk. Counseled on the importance of EMB to rule out cancerous changes vs. Waiting for D&C.  May still need mysoure D&C if pathology is benign.    Rachel Norris

## 2024-12-15 NOTE — Patient Instructions (Addendum)
 Fibroids in 2019 1cm to 3.30cm cm Lining was 8.33mm  Fibroids slightly larger on recent PUS at 4.3cm at max. Lining at 17mm on recent PUS

## 2024-12-17 ENCOUNTER — Other Ambulatory Visit: Payer: Self-pay | Admitting: Internal Medicine

## 2024-12-17 ENCOUNTER — Telehealth: Payer: Self-pay | Admitting: Internal Medicine

## 2024-12-17 ENCOUNTER — Other Ambulatory Visit: Payer: Self-pay

## 2024-12-17 MED ORDER — TIRZEPATIDE 5 MG/0.5ML ~~LOC~~ SOAJ: 5.0000 mg | SUBCUTANEOUS | 0 refills | Status: AC

## 2024-12-17 NOTE — Telephone Encounter (Signed)
 5 mg sent in today

## 2024-12-17 NOTE — Telephone Encounter (Signed)
 Copied from CRM #[id number]. Topic: Clinical - Medication Question >> Dec 17, 2024 12:18 PM Alfonso ORN wrote: Reason for CRM: pt wants to confirm if mounjaro  needs to go up in dosage for medication. Should she continue with 2.5 or go up to 5mg  .   Symptoms Pt has had dizziness gotten better ,appt scheduled with ENT doctor currently   Callback: [phone]

## 2025-01-07 ENCOUNTER — Other Ambulatory Visit (HOSPITAL_COMMUNITY)
Admission: RE | Admit: 2025-01-07 | Discharge: 2025-01-07 | Disposition: A | Source: Ambulatory Visit | Attending: Obstetrics and Gynecology | Admitting: Obstetrics and Gynecology

## 2025-01-07 ENCOUNTER — Encounter: Payer: Self-pay | Admitting: Obstetrics and Gynecology

## 2025-01-07 ENCOUNTER — Ambulatory Visit: Admitting: Obstetrics and Gynecology

## 2025-01-07 VITALS — BP 134/82 | HR 96 | Ht 62.0 in | Wt 294.0 lb

## 2025-01-07 DIAGNOSIS — R9389 Abnormal findings on diagnostic imaging of other specified body structures: Secondary | ICD-10-CM | POA: Diagnosis present

## 2025-01-07 DIAGNOSIS — E6609 Other obesity due to excess calories: Secondary | ICD-10-CM | POA: Insufficient documentation

## 2025-01-07 DIAGNOSIS — N859 Noninflammatory disorder of uterus, unspecified: Secondary | ICD-10-CM | POA: Diagnosis present

## 2025-01-07 DIAGNOSIS — Z01812 Encounter for preprocedural laboratory examination: Secondary | ICD-10-CM

## 2025-01-07 LAB — PREGNANCY, URINE: Preg Test, Ur: NEGATIVE

## 2025-01-07 NOTE — Progress Notes (Signed)
 "  Acute Office Visit  Subjective:    Patient ID: Rachel Norris, female    DOB: May 29, 1961, 64 y.o.   MRN: [id number]   HPI 64 y.o. presents today for Procedure (Endometrial Biopsy) .Thickened endometrial lining on PUS and fibroids   G0 presents for annual exam. Postmenopausal - no HRT, no bleeding. 1991 laser surgery on cervix. 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 H/O T2DM. Working on weight loss. Lost 80 pounds with diet a few years back. Has started to increase her exercise, has membership at [employer]. Did [facility] before and gained weight back. Will discuss mounjaro  with PCP, and has started this recently. Has rash under breasts that is bothersome Also with bothersome anal fissure H/o fibroids and D&C for thickened endometrial cancer    Wilbern Benne, RDMS Technician Specialty: Radiology   Progress Notes     Signed   Encounter Date: 04/29/2018   Signed                        Electronically signed by Wilbern Benne, RDMS at 04/29/2018  5:15 PM     Office Visit on 04/29/2018       Detailed Report    Additional Documentation  Vitals: BP 134/82 (BP Location: Right Arm, Patient Position: Sitting, Cuff Size: Large)   Pulse 72   Resp 14   Ht 5' 2.5 (1.588 m)   Wt 297 lb 6.4 oz (134.9 kg)   LMP 09/15/2016   BMI 53.53 kg/m   BSA 2.44 m      More Vitals  Flowsheets: MEWS Score,   Anthropometrics  Encounter Info: Billing Info,   History,   Allergies,   Detailed Report   Orders Placed   [facility]  Surgical pathology All Encounter Results Other Orders Performed  Endometrial biopsy Medication Changes   None Medication List Visit Diagnoses   Abnormal uterine bleeding (AUB)  Intramural and subserous leiomyoma of uterus Problem List  11/25/24 PUS results:    PACS Intelerad Image Link   Show images for US  PELVIC COMPLETE WITH TRANSVAGINAL Study Result  Narrative & Impression  CLINICAL DATA:   Initial evaluation for fibroids, endometrial thickening.   EXAM: TRANSABDOMINAL AND TRANSVAGINAL ULTRASOUND OF PELVIS   TECHNIQUE: Both transabdominal and transvaginal ultrasound examinations of the pelvis were performed. Transabdominal technique was performed for global imaging of the pelvis including uterus, ovaries, adnexal regions, and pelvic cul-de-sac. It was necessary to proceed with endovaginal exam following the transabdominal exam to visualize the endometrium.   COMPARISON:  Prior ultrasound from 04/29/2018.   FINDINGS: Uterus   Measurements: 7.6 x 3.7 x 6.7 cm = volume: 98.0 mL. Uterus is anteverted. Several fibroids seen as follows;   1. 3.4 x 2.3 x 2.7 cm intramural fibroid present at the right uterine fundus. 2. 1.3 x 1.3 x 1.2 cm intramural fibroid present at the anterior uterine body. 3. 4.3 x 3.9 x 4.1 cm intramural fibroid present at the left uterine body.   Endometrium   Thickness: 17 mm. Few scattered foci of internal vascularity seen within the thickened endometrial complex. No other focal abnormality.      Patient's last menstrual period was 09/15/2016.    Review of Systems     Objective:    OBGyn Exam  BP 134/82 (BP Location: Left Arm, Patient Position: Sitting, Cuff Size: Large)   Pulse 96   Ht 5' 2 (1.575 m)   Wt 294 lb (133.4 kg)   LMP  09/15/2016   SpO2 99%   BMI 53.77 kg/m  Wt Readings from Last 3 Encounters:  01/07/25 294 lb (133.4 kg)  12/14/24 297 lb (134.7 kg)  11/16/24 (!) 304 lb (137.9 kg)        PROCEDURE: EMB Consent obtained for the procedure.  A bivalve speculum was placed in the vagina.  The cervix was grasped with a single tooth tenaculum.  Pipelle was inserted and rotated.  Adequate specimen was obtained and sent to pathology.  All instruments were removed.  Patient tolerated the procedure well.  To notify patient of the results.    Assessment & Plan:  Obesity Thickened endometrial lining No PMB  EMB  collected today. To notify patient of the results.    Rachel Norris "

## 2025-01-08 ENCOUNTER — Other Ambulatory Visit: Payer: Self-pay | Admitting: Internal Medicine

## 2025-01-11 ENCOUNTER — Ambulatory Visit: Payer: Self-pay | Admitting: Obstetrics and Gynecology

## 2025-01-11 LAB — SURGICAL PATHOLOGY

## 2025-01-26 ENCOUNTER — Encounter: Payer: Self-pay | Admitting: Gastroenterology

## 2025-02-16 ENCOUNTER — Ambulatory Visit: Admitting: Internal Medicine

## 2025-10-14 ENCOUNTER — Ambulatory Visit: Admitting: Obstetrics and Gynecology
